# Patient Record
Sex: Female | Born: 1935
Health system: Southern US, Community
[De-identification: ages and names within clinical notes are randomized; demographics above are authoritative.]

## PROBLEM LIST (undated history)

## (undated) DIAGNOSIS — M353 Polymyalgia rheumatica: Secondary | ICD-10-CM

## (undated) DIAGNOSIS — E785 Hyperlipidemia, unspecified: Secondary | ICD-10-CM

## (undated) DIAGNOSIS — M1990 Unspecified osteoarthritis, unspecified site: Secondary | ICD-10-CM

## (undated) DIAGNOSIS — E119 Type 2 diabetes mellitus without complications: Secondary | ICD-10-CM

## (undated) DIAGNOSIS — R3129 Other microscopic hematuria: Secondary | ICD-10-CM

## (undated) DIAGNOSIS — I10 Essential (primary) hypertension: Secondary | ICD-10-CM

## (undated) HISTORY — DX: Hyperlipidemia, unspecified: E78.5

## (undated) HISTORY — PX: LAPAROSCOPIC HYSTERECTOMY: SHX1926

## (undated) HISTORY — DX: Essential (primary) hypertension: I10

## (undated) HISTORY — DX: Polymyalgia rheumatica: M35.3

## (undated) HISTORY — DX: Other microscopic hematuria: R31.29

## (undated) HISTORY — DX: Unspecified osteoarthritis, unspecified site: M19.90

## (undated) HISTORY — PX: CATARACT EXTRACTION: SUR2

---

## 2000-12-20 ENCOUNTER — Encounter: Payer: Self-pay | Admitting: Family Medicine

## 2000-12-20 ENCOUNTER — Encounter: Admission: RE | Admit: 2000-12-20 | Discharge: 2000-12-20 | Payer: Self-pay | Admitting: Family Medicine

## 2001-02-14 ENCOUNTER — Other Ambulatory Visit: Admission: RE | Admit: 2001-02-14 | Discharge: 2001-02-14 | Payer: Self-pay | Admitting: *Deleted

## 2001-03-15 ENCOUNTER — Encounter: Admission: RE | Admit: 2001-03-15 | Discharge: 2001-03-15 | Payer: Self-pay | Admitting: Family Medicine

## 2001-03-15 ENCOUNTER — Encounter: Payer: Self-pay | Admitting: Family Medicine

## 2001-07-06 ENCOUNTER — Encounter: Payer: Self-pay | Admitting: Family Medicine

## 2001-07-06 ENCOUNTER — Encounter: Admission: RE | Admit: 2001-07-06 | Discharge: 2001-07-06 | Payer: Self-pay | Admitting: Family Medicine

## 2001-07-08 ENCOUNTER — Encounter: Payer: Self-pay | Admitting: Gynecology

## 2001-07-09 ENCOUNTER — Inpatient Hospital Stay (HOSPITAL_COMMUNITY): Admission: RE | Admit: 2001-07-09 | Discharge: 2001-07-11 | Payer: Self-pay | Admitting: Gynecology

## 2001-12-18 ENCOUNTER — Encounter: Admission: RE | Admit: 2001-12-18 | Discharge: 2001-12-18 | Payer: Self-pay | Admitting: Family Medicine

## 2001-12-18 ENCOUNTER — Encounter: Payer: Self-pay | Admitting: Family Medicine

## 2001-12-25 ENCOUNTER — Encounter: Payer: Self-pay | Admitting: Family Medicine

## 2001-12-25 ENCOUNTER — Encounter: Admission: RE | Admit: 2001-12-25 | Discharge: 2001-12-25 | Payer: Self-pay | Admitting: Family Medicine

## 2002-11-14 ENCOUNTER — Other Ambulatory Visit: Admission: RE | Admit: 2002-11-14 | Discharge: 2002-11-14 | Payer: Self-pay | Admitting: Gynecology

## 2002-12-31 ENCOUNTER — Encounter: Payer: Self-pay | Admitting: Family Medicine

## 2002-12-31 ENCOUNTER — Ambulatory Visit (HOSPITAL_COMMUNITY): Admission: RE | Admit: 2002-12-31 | Discharge: 2002-12-31 | Payer: Self-pay | Admitting: Family Medicine

## 2003-07-04 HISTORY — PX: BREAST BIOPSY: SHX20

## 2003-12-16 ENCOUNTER — Other Ambulatory Visit: Admission: RE | Admit: 2003-12-16 | Discharge: 2003-12-16 | Payer: Self-pay | Admitting: Gynecology

## 2004-01-05 ENCOUNTER — Ambulatory Visit (HOSPITAL_COMMUNITY): Admission: RE | Admit: 2004-01-05 | Discharge: 2004-01-05 | Payer: Self-pay | Admitting: Gynecology

## 2004-08-11 ENCOUNTER — Emergency Department (HOSPITAL_COMMUNITY): Admission: EM | Admit: 2004-08-11 | Discharge: 2004-08-12 | Payer: Self-pay | Admitting: Emergency Medicine

## 2004-11-02 ENCOUNTER — Encounter: Payer: Self-pay | Admitting: Internal Medicine

## 2004-11-23 ENCOUNTER — Encounter: Payer: Self-pay | Admitting: Internal Medicine

## 2005-01-10 ENCOUNTER — Ambulatory Visit (HOSPITAL_COMMUNITY): Admission: RE | Admit: 2005-01-10 | Discharge: 2005-01-10 | Payer: Self-pay | Admitting: Gynecology

## 2005-02-10 ENCOUNTER — Encounter: Payer: Self-pay | Admitting: Internal Medicine

## 2005-09-04 ENCOUNTER — Encounter: Admission: RE | Admit: 2005-09-04 | Discharge: 2005-09-04 | Payer: Self-pay | Admitting: Family Medicine

## 2006-01-15 ENCOUNTER — Ambulatory Visit (HOSPITAL_COMMUNITY): Admission: RE | Admit: 2006-01-15 | Discharge: 2006-01-15 | Payer: Self-pay | Admitting: Family Medicine

## 2006-01-16 ENCOUNTER — Ambulatory Visit: Payer: Self-pay | Admitting: Family Medicine

## 2006-01-19 ENCOUNTER — Other Ambulatory Visit: Admission: RE | Admit: 2006-01-19 | Discharge: 2006-01-19 | Payer: Self-pay | Admitting: Gynecology

## 2006-02-23 ENCOUNTER — Encounter: Payer: Self-pay | Admitting: Internal Medicine

## 2006-02-23 ENCOUNTER — Ambulatory Visit (HOSPITAL_COMMUNITY): Admission: RE | Admit: 2006-02-23 | Discharge: 2006-02-23 | Payer: Self-pay | Admitting: Gastroenterology

## 2006-02-23 ENCOUNTER — Encounter (INDEPENDENT_AMBULATORY_CARE_PROVIDER_SITE_OTHER): Payer: Self-pay | Admitting: *Deleted

## 2006-02-23 LAB — HM COLONOSCOPY

## 2006-03-07 ENCOUNTER — Ambulatory Visit: Payer: Self-pay | Admitting: Family Medicine

## 2006-03-21 ENCOUNTER — Ambulatory Visit: Payer: Self-pay | Admitting: Family Medicine

## 2006-05-01 ENCOUNTER — Other Ambulatory Visit: Admission: RE | Admit: 2006-05-01 | Discharge: 2006-05-01 | Payer: Self-pay | Admitting: Gynecology

## 2006-05-11 ENCOUNTER — Ambulatory Visit: Payer: Self-pay | Admitting: Family Medicine

## 2006-05-11 LAB — CONVERTED CEMR LAB
Calcium: 9.1 mg/dL (ref 8.4–10.5)
Chloride: 102 meq/L (ref 96–112)
Chol/HDL Ratio, serum: 3.1
Creatinine, Ser: 0.7 mg/dL (ref 0.4–1.2)
HDL: 65.4 mg/dL (ref >39.0)
LDL Cholesterol: 101 mg/dL — ABNORMAL HIGH (ref 0–99)
Sodium: 140 meq/L (ref 135–145)
Triglyceride fasting, serum: 166 mg/dL — ABNORMAL HIGH (ref 0–149)

## 2006-05-14 ENCOUNTER — Encounter: Admission: RE | Admit: 2006-05-14 | Discharge: 2006-05-14 | Payer: Self-pay | Admitting: Obstetrics and Gynecology

## 2006-06-01 ENCOUNTER — Encounter: Admission: RE | Admit: 2006-06-01 | Discharge: 2006-06-01 | Payer: Self-pay | Admitting: General Surgery

## 2006-06-06 ENCOUNTER — Encounter (INDEPENDENT_AMBULATORY_CARE_PROVIDER_SITE_OTHER): Payer: Self-pay | Admitting: Specialist

## 2006-06-06 ENCOUNTER — Ambulatory Visit (HOSPITAL_BASED_OUTPATIENT_CLINIC_OR_DEPARTMENT_OTHER): Admission: RE | Admit: 2006-06-06 | Discharge: 2006-06-06 | Payer: Self-pay | Admitting: General Surgery

## 2006-07-25 DIAGNOSIS — K649 Unspecified hemorrhoids: Secondary | ICD-10-CM | POA: Insufficient documentation

## 2006-08-17 ENCOUNTER — Ambulatory Visit: Payer: Self-pay | Admitting: Family Medicine

## 2006-08-17 LAB — CONVERTED CEMR LAB
AST: 26 units/L (ref 0–37)
Calcium: 9 mg/dL (ref 8.4–10.5)
Chloride: 101 meq/L (ref 96–112)
Direct LDL: 115.9 mg/dL
GFR calc Af Amer: 126 mL/min
Glucose, Bld: 96 mg/dL (ref 70–99)
Total CHOL/HDL Ratio: 2.8
Triglycerides: 137 mg/dL (ref 0–149)
VLDL: 27 mg/dL (ref 0–40)

## 2006-11-20 DIAGNOSIS — I10 Essential (primary) hypertension: Secondary | ICD-10-CM | POA: Insufficient documentation

## 2006-11-29 ENCOUNTER — Encounter: Payer: Self-pay | Admitting: Family Medicine

## 2006-11-29 ENCOUNTER — Ambulatory Visit: Payer: Self-pay | Admitting: Family Medicine

## 2006-11-29 ENCOUNTER — Encounter (INDEPENDENT_AMBULATORY_CARE_PROVIDER_SITE_OTHER): Payer: Self-pay | Admitting: Family Medicine

## 2006-11-29 DIAGNOSIS — E785 Hyperlipidemia, unspecified: Secondary | ICD-10-CM

## 2007-03-01 ENCOUNTER — Ambulatory Visit: Payer: Self-pay | Admitting: Family Medicine

## 2007-03-05 ENCOUNTER — Telehealth (INDEPENDENT_AMBULATORY_CARE_PROVIDER_SITE_OTHER): Payer: Self-pay | Admitting: *Deleted

## 2007-03-05 LAB — CONVERTED CEMR LAB
BUN: 14 mg/dL (ref 6–23)
Calcium: 9.2 mg/dL (ref 8.4–10.5)
Cholesterol: 197 mg/dL (ref 0–200)
GFR calc Af Amer: 126 mL/min
GFR calc non Af Amer: 104 mL/min
Triglycerides: 148 mg/dL (ref 0–149)
VLDL: 30 mg/dL (ref 0–40)

## 2007-06-13 ENCOUNTER — Ambulatory Visit: Payer: Self-pay | Admitting: Family Medicine

## 2007-06-13 LAB — CONVERTED CEMR LAB
BUN: 15 mg/dL (ref 6–23)
CO2: 29 meq/L (ref 19–32)
Calcium: 9.1 mg/dL (ref 8.4–10.5)
Chloride: 99 meq/L (ref 96–112)
Creatinine, Ser: 0.7 mg/dL (ref 0.4–1.2)
GFR calc Af Amer: 105 mL/min
Glucose, Bld: 93 mg/dL (ref 70–99)

## 2007-06-14 ENCOUNTER — Telehealth (INDEPENDENT_AMBULATORY_CARE_PROVIDER_SITE_OTHER): Payer: Self-pay | Admitting: *Deleted

## 2007-06-14 ENCOUNTER — Encounter (INDEPENDENT_AMBULATORY_CARE_PROVIDER_SITE_OTHER): Payer: Self-pay | Admitting: *Deleted

## 2007-09-11 ENCOUNTER — Ambulatory Visit: Payer: Self-pay | Admitting: Family Medicine

## 2007-09-12 ENCOUNTER — Encounter: Admission: RE | Admit: 2007-09-12 | Discharge: 2007-09-12 | Payer: Self-pay | Admitting: Family Medicine

## 2007-09-13 ENCOUNTER — Encounter (INDEPENDENT_AMBULATORY_CARE_PROVIDER_SITE_OTHER): Payer: Self-pay | Admitting: *Deleted

## 2007-09-13 LAB — CONVERTED CEMR LAB
ALT: 19 units/L (ref 0–35)
CO2: 30 meq/L (ref 19–32)
Calcium: 9.2 mg/dL (ref 8.4–10.5)
Chloride: 105 meq/L (ref 96–112)
Direct LDL: 111.2 mg/dL
GFR calc Af Amer: 105 mL/min
GFR calc non Af Amer: 87 mL/min
Potassium: 3.7 meq/L (ref 3.5–5.1)
Sodium: 140 meq/L (ref 135–145)
Triglycerides: 187 mg/dL — ABNORMAL HIGH (ref 0–149)

## 2007-10-03 ENCOUNTER — Telehealth (INDEPENDENT_AMBULATORY_CARE_PROVIDER_SITE_OTHER): Payer: Self-pay | Admitting: *Deleted

## 2008-01-10 ENCOUNTER — Ambulatory Visit: Payer: Self-pay | Admitting: Internal Medicine

## 2008-01-10 DIAGNOSIS — M353 Polymyalgia rheumatica: Secondary | ICD-10-CM

## 2008-01-14 LAB — CONVERTED CEMR LAB
Basophils Absolute: 0.1 10*3/uL (ref 0.0–0.1)
Eosinophils Absolute: 0.1 10*3/uL (ref 0.0–0.7)
Eosinophils Relative: 2 % (ref 0.0–5.0)
Hemoglobin: 13.5 g/dL (ref 12.0–15.0)
Monocytes Absolute: 0.4 10*3/uL (ref 0.1–1.0)
Monocytes Relative: 6 % (ref 3.0–12.0)
Neutro Abs: 3.2 10*3/uL (ref 1.4–7.7)
RBC: 4.14 M/uL (ref 3.87–5.11)
TSH: 1.99 microintl units/mL (ref 0.35–5.50)
Total CK: 192 units/L — ABNORMAL HIGH (ref 7–177)

## 2008-01-29 ENCOUNTER — Telehealth (INDEPENDENT_AMBULATORY_CARE_PROVIDER_SITE_OTHER): Payer: Self-pay | Admitting: *Deleted

## 2008-02-06 ENCOUNTER — Ambulatory Visit: Payer: Self-pay | Admitting: Internal Medicine

## 2008-04-24 ENCOUNTER — Ambulatory Visit: Payer: Self-pay | Admitting: Internal Medicine

## 2008-07-29 ENCOUNTER — Ambulatory Visit: Payer: Self-pay | Admitting: Internal Medicine

## 2008-07-31 ENCOUNTER — Encounter (INDEPENDENT_AMBULATORY_CARE_PROVIDER_SITE_OTHER): Payer: Self-pay | Admitting: *Deleted

## 2008-07-31 ENCOUNTER — Telehealth (INDEPENDENT_AMBULATORY_CARE_PROVIDER_SITE_OTHER): Payer: Self-pay | Admitting: *Deleted

## 2008-08-03 ENCOUNTER — Encounter (INDEPENDENT_AMBULATORY_CARE_PROVIDER_SITE_OTHER): Payer: Self-pay | Admitting: *Deleted

## 2008-08-04 ENCOUNTER — Other Ambulatory Visit: Admission: RE | Admit: 2008-08-04 | Discharge: 2008-08-04 | Payer: Self-pay | Admitting: Gynecology

## 2008-08-04 ENCOUNTER — Encounter: Payer: Self-pay | Admitting: Gynecology

## 2008-08-04 ENCOUNTER — Ambulatory Visit: Payer: Self-pay | Admitting: Gynecology

## 2008-09-10 ENCOUNTER — Telehealth (INDEPENDENT_AMBULATORY_CARE_PROVIDER_SITE_OTHER): Payer: Self-pay | Admitting: *Deleted

## 2008-09-25 ENCOUNTER — Ambulatory Visit: Payer: Self-pay | Admitting: Internal Medicine

## 2008-11-26 ENCOUNTER — Ambulatory Visit: Payer: Self-pay | Admitting: Internal Medicine

## 2008-12-16 ENCOUNTER — Ambulatory Visit: Payer: Self-pay | Admitting: Internal Medicine

## 2008-12-16 DIAGNOSIS — M170 Bilateral primary osteoarthritis of knee: Secondary | ICD-10-CM

## 2008-12-22 ENCOUNTER — Telehealth: Payer: Self-pay | Admitting: Internal Medicine

## 2009-03-09 ENCOUNTER — Telehealth (INDEPENDENT_AMBULATORY_CARE_PROVIDER_SITE_OTHER): Payer: Self-pay | Admitting: *Deleted

## 2009-04-01 ENCOUNTER — Encounter: Admission: RE | Admit: 2009-04-01 | Discharge: 2009-04-01 | Payer: Self-pay | Admitting: Internal Medicine

## 2009-04-02 ENCOUNTER — Telehealth (INDEPENDENT_AMBULATORY_CARE_PROVIDER_SITE_OTHER): Payer: Self-pay | Admitting: *Deleted

## 2009-04-19 ENCOUNTER — Ambulatory Visit: Payer: Self-pay | Admitting: Internal Medicine

## 2009-04-19 DIAGNOSIS — N951 Menopausal and female climacteric states: Secondary | ICD-10-CM | POA: Insufficient documentation

## 2009-08-17 ENCOUNTER — Ambulatory Visit: Payer: Self-pay | Admitting: Internal Medicine

## 2009-08-24 ENCOUNTER — Ambulatory Visit: Payer: Self-pay | Admitting: Internal Medicine

## 2009-08-30 LAB — CONVERTED CEMR LAB
ALT: 21 units/L (ref 0–35)
BUN: 22 mg/dL (ref 6–23)
CO2: 30 meq/L (ref 19–32)
Calcium: 9.5 mg/dL (ref 8.4–10.5)
Creatinine, Ser: 0.8 mg/dL (ref 0.4–1.2)
Direct LDL: 164.8 mg/dL
Eosinophils Absolute: 0.2 10*3/uL (ref 0.0–0.7)
GFR calc non Af Amer: 74.16 mL/min (ref >60)
Hemoglobin: 13.1 g/dL (ref 12.0–15.0)
Lymphs Abs: 2.3 10*3/uL (ref 0.7–4.0)
MCHC: 33.1 g/dL (ref 30.0–36.0)
MCV: 95.3 fL (ref 78.0–100.0)
Platelets: 263 10*3/uL (ref 150.0–400.0)
TSH: 2.55 microintl units/mL (ref 0.35–5.50)
Total CHOL/HDL Ratio: 4
Triglycerides: 134 mg/dL (ref 0.0–149.0)
VLDL: 26.8 mg/dL (ref 0.0–40.0)
WBC: 5.5 10*3/uL (ref 4.5–10.5)

## 2010-03-03 ENCOUNTER — Telehealth (INDEPENDENT_AMBULATORY_CARE_PROVIDER_SITE_OTHER): Payer: Self-pay | Admitting: *Deleted

## 2010-04-06 ENCOUNTER — Ambulatory Visit: Payer: Self-pay | Admitting: Internal Medicine

## 2010-04-07 ENCOUNTER — Encounter (INDEPENDENT_AMBULATORY_CARE_PROVIDER_SITE_OTHER): Payer: Self-pay | Admitting: *Deleted

## 2010-04-21 ENCOUNTER — Ambulatory Visit: Payer: Self-pay | Admitting: Internal Medicine

## 2010-04-21 DIAGNOSIS — S61409A Unspecified open wound of unspecified hand, initial encounter: Secondary | ICD-10-CM | POA: Insufficient documentation

## 2010-04-22 ENCOUNTER — Encounter: Admission: RE | Admit: 2010-04-22 | Discharge: 2010-04-22 | Payer: Self-pay | Admitting: Internal Medicine

## 2010-04-22 ENCOUNTER — Encounter: Payer: Self-pay | Admitting: Internal Medicine

## 2010-04-29 ENCOUNTER — Telehealth: Payer: Self-pay | Admitting: Internal Medicine

## 2010-05-13 ENCOUNTER — Telehealth: Payer: Self-pay | Admitting: Internal Medicine

## 2010-05-13 ENCOUNTER — Encounter (INDEPENDENT_AMBULATORY_CARE_PROVIDER_SITE_OTHER): Payer: Self-pay | Admitting: *Deleted

## 2010-05-17 ENCOUNTER — Encounter: Payer: Self-pay | Admitting: Internal Medicine

## 2010-05-17 ENCOUNTER — Telehealth (INDEPENDENT_AMBULATORY_CARE_PROVIDER_SITE_OTHER): Payer: Self-pay | Admitting: *Deleted

## 2010-06-09 ENCOUNTER — Telehealth: Payer: Self-pay | Admitting: Internal Medicine

## 2010-07-07 ENCOUNTER — Telehealth: Payer: Self-pay | Admitting: Internal Medicine

## 2010-07-31 LAB — CONVERTED CEMR LAB
ALT: 17 U/L
AST: 22 U/L
BUN: 15 mg/dL
CO2: 31 meq/L
Calcium: 9.3 mg/dL
Chloride: 102 meq/L
Cholesterol: 211 mg/dL
Creatinine, Ser: 0.7 mg/dL
Direct LDL: 109.2 mg/dL
GFR calc Af Amer: 105 mL/min
GFR calc non Af Amer: 87 mL/min
Glucose, Bld: 87 mg/dL
HDL: 79.2 mg/dL
Potassium: 3.7 meq/L
Sodium: 138 meq/L
Total CHOL/HDL Ratio: 2.7
Triglycerides: 148 mg/dL
VLDL: 30 mg/dL
Vit D, 1,25-Dihydroxy: 41

## 2010-08-02 NOTE — Assessment & Plan Note (Signed)
Summary: WALK IN/CUT HAND/KN   Vital Signs:  Patient profile:   77 year old female Weight:      140.38 pounds Pulse rate:   90 / minute Pulse rhythm:   regular BP sitting:   126 / 84  (left arm) Cuff size:   regular  Vitals Entered By: Army Fossa CMA (April 21, 2010 8:24 AM) CC: Cut hand with scissors yesterday Comments (L) hand very bruised today.  [facility] [address]   History of Present Illness: cut her hand w/ scissor  yesterday, immediately after she developed a hematomain the area. She has some external bleeding but not much. She already cleaned the area very well. At this point pain is mild  Allergies: No Known Drug Allergies  Past History:  Past Medical History: Reviewed history from 08/17/2009 and no changes required. HYPERLIPIDEMIA  HYPERTENSION  HEMORRHOIDS   ?PMR Osteoarthritis  Past Surgical History: Reviewed history from 07/29/2008 and no changes required. hysterectomy Dr Lily Peer ?oophorectomy stress incontinence eval  and microhematuria  eval  Dr Patsi Sears 2004 Cataract extraction breast Bx (L) ---> benign , aprox 2005  Review of Systems       no discharge from the area No fever  Physical Exam  General:  alert and well-developed.   Msk:  left wrist normal Left fingers are all normal, good capillary refill, good range of motion. No motor deficits noted Extremities:  left hand has 2 superficial excoriations in the dorsum, no active bleeding  has a 4x2 cm superficial hematoma around the excoriations. no warmth redness or redness    Impression & Recommendations:  Problem # 1:  WOUND, HAND (ICD-882.0) patient develop a hematoma following a cut  with the tip of the scissors she is updated her tetanus recommend to keep the hand elevated recommend to keep the wounds clean and dry and put antibiotic ointment knows to call if she develops redness or warmness  Complete Medication List: 1)  Carvedilol 6.25 Mg Tabs (Carvedilol) .Marland Kitchen.. 1 by  mouth two times a day 2)  Fosinopril Sodium 40 Mg Tabs (Fosinopril sodium) .... Take 2  tablet daily 3)  Hydrochlorothiazide 25 Mg Tabs (Hydrochlorothiazide) .Marland Kitchen.. 1 by mouth qd 4)  Bayer Low Strength 81 Mg Tbec (Aspirin) 5)  Oscal 500/200 D-3 500-200 Mg-unit Tabs (Calcium-vitamin d) 6)  Centrum Silver Tabs (Multiple vitamins-minerals) 7)  Arthritis Pain Reliever 650 Mg Tbcr (Acetaminophen) 8)  Omega 3 Fish Oil    Orders Added: 1)  Est. Patient Level III [36644]

## 2010-08-02 NOTE — Progress Notes (Signed)
Summary: referral  Phone Note Call from Patient Call back at Home Phone [phone]   Caller: Daughter Summary of Call: Pts daughter called and stated that becuase of pts arthritis, they would like a referral to a specialist. She is in a lot of pain the daughter states. Please advise. Initial call taken by: Army Fossa CMA,  April 29, 2010 2:26 PM  Follow-up for Phone Call        refer to orthopedic surgery Follow-up by: [facility] E. Paz MD,  May 02, 2010 12:56 PM

## 2010-08-02 NOTE — Letter (Signed)
Summary: Unable To Reach-Consult Scheduled  [facility] [facility] [place]  [address] [place] [address], [place] [address]   Phone: [phone]  Fax: [phone]    05/13/2010 MRN: [id number]    Dear Ms. Locken,   We have been unable to reach you by phone.  Please contact our office with an updated phone number.     Thank you,  [place] [facility]

## 2010-08-02 NOTE — Progress Notes (Signed)
Summary: appt  Phone Note Outgoing Call   Summary of Call: Pt needs a 6 month f/u appt with dr Drue Novel. Army Fossa CMA  March 03, 2010 7:47 AM   Follow-up for Phone Call        [email or web address] Negrete  March 03, 2010 12:12 PM  PATIENT DAUGHTER CALLED BACK - PATIENT DOESNT UNDERSTAND ENGLISH - APPT SCHEDULED [phone] .Marland KitchenOkey Regal [place]  March 03, 2010 4:10 PM

## 2010-08-02 NOTE — Assessment & Plan Note (Signed)
Summary: yearly checkup/NS/KDC   Vital Signs:  Patient profile:   77 year old female Height:      56 inches Weight:      139.4 pounds BMI:     31.37 Pulse rate:   78 / minute BP sitting:   150 / 80 CC: cpx Comments not fasting   History of Present Illness: HYPERLIPIDEMIA -- on fish oil; her diet was not good  in the last few months, not active   HYPERTENSION -good medication compliance , no ambulatory BPs    Osteoarthritis-- still has pain, ankle , knees, hands . Hydrocodone helps   yearly, chart reviewed  Allergies: No Known Drug Allergies  Past History:  Past Medical History: HYPERLIPIDEMIA  HYPERTENSION  HEMORRHOIDS   ?PMR Osteoarthritis  Past Surgical History: Reviewed history from 07/29/2008 and no changes required. hysterectomy Dr Lily Peer ?oophorectomy stress incontinence eval  and microhematuria  eval  Dr Patsi Sears 2004 Cataract extraction breast Bx (L) ---> benign , aprox 2005  Social History: Reviewed history from 04/19/2009 and no changes required. Widow  original from [place] lives w/ her  3 daughters Tobacco--no ETOH -- no  Review of Systems       denies chest pain, shortness of breath some mild bilateral ankle edema no nausea vomiting or diarrhea.  No blood in the stools mild anxiety  and depression sometimes mostly related to family issues no dysuria or hematuria  Physical Exam  General:  alert, well-developed, and well-nourished.   Neck:  no masses and no thyromegaly.   Lungs:  normal respiratory effort, no intercostal retractions, no accessory muscle use, and normal breath sounds.   Heart:  normal rate, regular rhythm, and no murmur.   Abdomen:  soft, non-tender, no distention, no masses, no guarding, and no rigidity.   Extremities:  trace ankle edema bilateral  hands with OA type of changes, mostly at the DIPs, no warmness or puffiness wrists  also without warmness or puffiness Psych:  Oriented X3, memory intact for recent  and remote, normally interactive, good eye contact, not anxious appearing, and not depressed appearing.     Impression & Recommendations:  Problem # 1:  OSTEOARTHRITIS (ICD-715.90) she took hydrocodone before without side effects Will try to use Tylenol first and then hydrocodone if the pain is severe, see instructions Her updated medication list for this problem includes:    Hydrocodone-acetaminophen 5-500 Mg Tabs (Hydrocodone-acetaminophen) ..... Half or one tablet every 6 hours as needed for pain    Bayer Low Strength 81 Mg Tbec (Aspirin)    Arthritis Pain Reliever 650 Mg Tbcr (Acetaminophen)  Problem # 2:  HEALTH SCREENING (ICD-V70.0) CHART REVIEWED  diet- exercise encouraged  Td-- today pneumonia shot 2010 shingles shot 3-10 had a flu shot   MMG  9-10, neg  last OV w/ Gyn 2010, need to get records DEXA ---> per Dr Lily Peer? get records   had a Cscope Dr Loreta Ave aprox 4 years ago per pt  , get records      Problem # 3:  HYPERLIPIDEMIA (ICD-272.4) due for labs, on diet only  Labs Reviewed: SGOT: 22 (07/29/2008)   SGPT: 17 (07/29/2008)   HDL:79.2 (07/29/2008), 77.0 (09/11/2007)  LDL:DEL (07/29/2008), DEL (09/11/2007)  Chol:211 (07/29/2008), 216 (09/11/2007)  Trig:148 (07/29/2008), 187 (09/11/2007)  Problem # 4:  HYPERTENSION (ICD-401.9) bp slightly  elevated today recheck on RTC labs  Her updated medication list for this problem includes:    Carvedilol 6.25 Mg Tabs (Carvedilol) .Marland Kitchen... 1 by mouth two times a  day    Fosinopril Sodium 40 Mg Tabs (Fosinopril sodium) .Marland Kitchen... Take 2  tablet daily    Hydrochlorothiazide 25 Mg Tabs (Hydrochlorothiazide) .Marland Kitchen... 1 by mouth qd  BP today: 150/80 Prior BP: 130/84 (04/19/2009)  Labs Reviewed: K+: 3.7 (07/29/2008) Creat: : 0.7 (07/29/2008)   Chol: 211 (07/29/2008)   HDL: 79.2 (07/29/2008)   LDL: DEL (07/29/2008)   TG: 148 (07/29/2008)  Her updated medication list for this problem includes:    Carvedilol 6.25 Mg Tabs (Carvedilol)  .Marland Kitchen... 1 by mouth two times a day    Fosinopril Sodium 40 Mg Tabs (Fosinopril sodium) .Marland Kitchen... Take 2  tablet daily    Hydrochlorothiazide 25 Mg Tabs (Hydrochlorothiazide) .Marland Kitchen... 1 by mouth qd  Complete Medication List: 1)  Carvedilol 6.25 Mg Tabs (Carvedilol) .Marland Kitchen.. 1 by mouth two times a day 2)  Fosinopril Sodium 40 Mg Tabs (Fosinopril sodium) .... Take 2  tablet daily 3)  Hydrochlorothiazide 25 Mg Tabs (Hydrochlorothiazide) .Marland Kitchen.. 1 by mouth qd 4)  Hydrocodone-acetaminophen 5-500 Mg Tabs (Hydrocodone-acetaminophen) .... Half or one tablet every 6 hours as needed for pain 5)  Bayer Low Strength 81 Mg Tbec (Aspirin) 6)  Oscal 500/200 D-3 500-200 Mg-unit Tabs (Calcium-vitamin d) 7)  Centrum Silver Tabs (Multiple vitamins-minerals) 8)  Arthritis Pain Reliever 650 Mg Tbcr (Acetaminophen) 9)  Omega 3 Fish Oil   Other Orders: TD Toxoids IM 7 YR + ([address]) Admin 1st Vaccine ([address]) Admin 1st Vaccine [facility]) [phone]  Patient Instructions: 1)  come back fasting: 2)  BMP CBC TSH--- Dx  hypertension 3)  FLP  AST ALT---- dx  high cholesterol 4)  for pain, take 1000  mg of tylenol every 6  hours as needed .if the pain is severe, takes hydrocodone instead.  Hydrocodone also has  tyelnol on it 5)  Avoid taking more than 4000 mg of tylenol in a 24 hour period( can cause liver damage in higher doses).  6)  Please schedule a follow-up appointment in 4 to 6 months .  Prescriptions: HYDROCODONE-ACETAMINOPHEN 5-500 MG TABS (HYDROCODONE-ACETAMINOPHEN) half or one tablet every 6 hours as needed for pain  #60 x 0   Entered and Authorized by:   Nolon Rod. Alexia Dinger MD   Signed by:   Nolon Rod. Mylo Choi MD on 08/17/2009   Method used:   Print then Give to Patient   RxID:   [id number]    Tetanus/Td Vaccine    Vaccine Type: Td    Site: left deltoid    Dose: 0.5 ml    Route: IM    Given by: rachel peeler    Exp. Date: 09/15/2009    Lot #: [id number]  Appended Document: yearly checkup/NS/KDC COLONOSCOPY: Results: Polyp:   polypod colorectal mucosa.  No adenomatous change or malignancy identified------Jyothi Loreta Ave, MD (02/23/2006 10:53) Repeat colonoscopy in 10 years.

## 2010-08-02 NOTE — Letter (Signed)
Summary: Primary Care Consult Scheduled Letter  [facility] [place]  [address] [place], [place] [address]   Phone: [phone]  Fax: [phone]      04/07/2010 MRN: [id number]  Alison Graves [address] [place], [place]  [address]    Dear Ms. Durkee,    We have scheduled an appointment for you.  At the recommendation of Dr. Willow Ora, we have scheduled you for a Bone Density Scan with [facility] on 04-22-2010 at 1:30pm.  Their address is [address], [address], [place] [address]. The office phone number is [phone].  If this appointment day and time is not convenient for you, please feel free to call the office of the doctor you are being referred to at the number listed above and reschedule the appointment.    It is important for you to keep your scheduled appointments. We are here to make sure you are given good patient care.   Thank you,    Renee, Patient Care Coordinator  at [facility]

## 2010-08-02 NOTE — Letter (Signed)
Summary: Patient Not Returning Calls/[facility]  Patient Not Returning Calls/[facility]   Imported By: Lanelle Bal 06/07/2010 14:09:35  _____________________________________________________________________  External Attachment:    Type:   Image     Comment:   External Document

## 2010-08-02 NOTE — Progress Notes (Signed)
Summary: dental referral-lmom  Phone Note Call from Patient   Caller: Daughter-Isabel Summary of Call: patient left msg on voicemail to have referral for dentist.  Follow-up for Phone Call        left message on machine .......Marland KitchenDoristine Devoid CMA  May 17, 2010 5:03 PM

## 2010-08-02 NOTE — Progress Notes (Signed)
Summary: problem filling Fosinopril  Phone Note Call from Patient Call back at Home Phone [phone]   Caller: daughter [phone] Summary of Call: Alison Graves called to see why prescription [facility]---I explained that prescription for this medication plus refills was called in on Nov 2011 and that this existing prescription already has refills --[facility] [place] [facility]---[facility]---please call pharmacy to resolve problem, then call Alison Graves so she can pick up her mom's prescription Initial call taken by: Jerolyn Shin,  June 09, 2010 1:02 PM  Follow-up for Phone Call        [facility] and their [facility] are [facility] and they will have  the prescription ready for the patient tomorrow. They have sent requests and we can ignore those. Patient daughter notified. Alison Graves CMA  June 09, 2010 2:54 PM

## 2010-08-02 NOTE — Procedures (Signed)
Summary: Colonoscopy/ polyp x 1 , next 10 years   Colonoscopy/[facility]   Imported By: Lanelle Bal 09/09/2009 09:34:21  _____________________________________________________________________  External Attachment:    Type:   Image     Comment:   External Document

## 2010-08-02 NOTE — Miscellaneous (Signed)
Summary: colonoscopy report  Clinical Lists Changes  Observations: Added new observation of COLONRECACT: Repeat colonoscopy in 10 years.   (02/23/2006 10:53) Added new observation of COLONOSCOPY: Results: Polyp:  polypod colorectal mucosa.  No adenomatous change or malignancy identified Charna Elizabeth, MD (02/23/2006 10:53)      Colonoscopy  Procedure date:  02/23/2006  Findings:      Results: Polyp:  polypod colorectal mucosa.  No adenomatous change or malignancy identified Charna Elizabeth, MD  Comments:      Repeat colonoscopy in 10 years.     Colonoscopy  Procedure date:  02/23/2006  Findings:      Results: Polyp:  polypod colorectal mucosa.  No adenomatous change or malignancy identified Charna Elizabeth, MD  Comments:      Repeat colonoscopy in 10 years.

## 2010-08-02 NOTE — Assessment & Plan Note (Signed)
Summary: rto 6 months/cbs   Vital Signs:  Patient profile:   77 year old female Height:      56 inches (142.24 cm) Weight:      141.50 pounds (64.32 kg) BMI:     31.84 O2 Sat:      95 % on Room air Temp:     97.1 degrees F (36.17 degrees C) oral Pulse rate:   80 / minute BP sitting:   140 / 80  (left arm)  Vitals Entered By: Lucious Groves CMA (April 06, 2010 12:54 PM)  O2 Flow:  Room air CC: 6 mo f/u./kb Is Patient Diabetic? No Pain Assessment Patient in pain? no      Comments Patient denies taking Hydrocodone stating that she only takes Tylenol./kb   History of Present Illness: ROV still has pain of OA, taking tylenol only, did not like to take hydrocodone h/o celebrex use, tolerated well      Current Medications (verified): 1)  Carvedilol 6.25 Mg  Tabs (Carvedilol) .Marland Kitchen.. 1 By Mouth Two Times A Day 2)  Fosinopril Sodium 40 Mg Tabs (Fosinopril Sodium) .... Take 2  Tablet Daily 3)  Hydrochlorothiazide 25 Mg Tabs (Hydrochlorothiazide) .Marland Kitchen.. 1 By Mouth Qd 4)  Hydrocodone-Acetaminophen 5-500 Mg Tabs (Hydrocodone-Acetaminophen) .... Half or One Tablet Every 6 Hours As Needed For Pain 5)  Bayer Low Strength 81 Mg  Tbec (Aspirin) 6)  Oscal 500/200 D-3 500-200 Mg-Unit  Tabs (Calcium-Vitamin D) 7)  Centrum Silver   Tabs (Multiple Vitamins-Minerals) 8)  Arthritis Pain Reliever 650 Mg  Tbcr (Acetaminophen) 9)  Omega 3 Fish Oil  Allergies (verified): No Known Drug Allergies  Past History:  Past Medical History: Reviewed history from 08/17/2009 and no changes required. HYPERLIPIDEMIA  HYPERTENSION  HEMORRHOIDS   ?PMR Osteoarthritis  Past Surgical History: Reviewed history from 07/29/2008 and no changes required. hysterectomy Dr Lily Peer ?oophorectomy stress incontinence eval  and microhematuria  eval  Dr Patsi Sears 2004 Cataract extraction breast Bx (L) ---> benign , aprox 2005  Social History: Reviewed history from 04/19/2009 and no changes required. Widow    original from [place] lives w/ her  3 daughters Tobacco--no ETOH -- no  Review of Systems General:  diet-- has not changed  exercise -- no. CV:  Denies chest pain or discomfort and swelling of feet. Resp:  Denies shortness of breath; occasional cough, dry , mild, not frecuent .  Physical Exam  General:  alert and well-developed.   Lungs:  normal respiratory effort, no intercostal retractions, no accessory muscle use, and normal breath sounds.   Heart:  normal rate, regular rhythm, and no murmur.   Extremities:  no lower extremity edema   Impression & Recommendations:  Problem # 1:  OSTEOARTHRITIS (ICD-715.90)  continued with pain,most of the pain is in her knees, declined  to take hydrocodone We talked about her  options... Refer to orthopedic surgery, total knee replacement? Declined Other option would be to take Celebrex, she agreed to a trial  The following medications were removed from the medication list:    Hydrocodone-acetaminophen 5-500 Mg Tabs (Hydrocodone-acetaminophen) ..... Half or one tablet every 6 hours as needed for pain Her updated medication list for this problem includes:    Bayer Low Strength 81 Mg Tbec (Aspirin)    Arthritis Pain Reliever 650 Mg Tbcr (Acetaminophen)    Celebrex 100 Mg Caps (Celecoxib) ..... One by mouth twice a day as needed for pain  Orders: Gynecologic Referral (Gyn) Radiology Referral (Radiology)  Problem # 2:  HEALTH SCREENING (ICD-V70.0) her FLP was a slightly elevated the last time, diet  discussed. exercise limited due to  DJD Likes to see a new gynecologist, referral I have not been able to obtain her latest bone density test, patient thinks it was more than 4 years ago. Ordering a DEXA Flu shot provided  Problem # 3:  HYPERTENSION (ICD-401.9) well controlled Her updated medication list for this problem includes:    Carvedilol 6.25 Mg Tabs (Carvedilol) .Marland Kitchen... 1 by mouth two times a day    Fosinopril Sodium 40 Mg Tabs  (Fosinopril sodium) .Marland Kitchen... Take 2  tablet daily    Hydrochlorothiazide 25 Mg Tabs (Hydrochlorothiazide) .Marland Kitchen... 1 by mouth qd  BP today: 140/80 Prior BP: 150/80 (08/17/2009)  Labs Reviewed: K+: 3.9 (08/24/2009) Creat: : 0.8 (08/24/2009)   Chol: 249 (08/24/2009)   HDL: 68.40 (08/24/2009)   LDL: DEL (07/29/2008)   TG: 134.0 (08/24/2009)  Complete Medication List: 1)  Carvedilol 6.25 Mg Tabs (Carvedilol) .Marland Kitchen.. 1 by mouth two times a day 2)  Fosinopril Sodium 40 Mg Tabs (Fosinopril sodium) .... Take 2  tablet daily 3)  Hydrochlorothiazide 25 Mg Tabs (Hydrochlorothiazide) .Marland Kitchen.. 1 by mouth qd 4)  Bayer Low Strength 81 Mg Tbec (Aspirin) 5)  Oscal 500/200 D-3 500-200 Mg-unit Tabs (Calcium-vitamin d) 6)  Centrum Silver Tabs (Multiple vitamins-minerals) 7)  Arthritis Pain Reliever 650 Mg Tbcr (Acetaminophen) 8)  Omega 3 Fish Oil  9)  Celebrex 100 Mg Caps (Celecoxib) .... One by mouth twice a day as needed for pain  Other Orders: Flu Vaccine 33yrs + MEDICARE PATIENTS (M0102) Administration Flu vaccine - MCR (V2536)  Patient Instructions: 1)  take Celebrex twice a day with food as needed for pain, you can also use Tylenol in addition to Celebrex if the pain continued 2)  Please schedule a follow-up appointment in 6 months , fasting, physical exam Prescriptions: CELEBREX 100 MG CAPS (CELECOXIB) one by mouth twice a day as needed for pain  #60 x 1   Entered and Authorized by:   Elita Quick E. Paz MD   Signed by:   Nolon Rod. Paz MD on 04/06/2010   Method used:   Print then Give to Patient   RxID:   [id number]  Flu Vaccine Consent Questions     Do you have a history of severe allergic reactions to this vaccine? no    Any prior history of allergic reactions to egg and/or gelatin? no    Do you have a sensitivity to the preservative Thimersol? no    Do you have a past history of Guillan-Barre Syndrome? no    Do you currently have an acute febrile illness? no    Have you ever had a severe reaction to  latex? no    Vaccine information given and explained to patient? yes    Are you currently pregnant? no    Lot Number:[id number]   Exp Date:12/31/2010   Site Given  Right Deltoid IM:   Print then Give to Patient   RxID:   [id number]     [email or web address]

## 2010-08-02 NOTE — Progress Notes (Signed)
Summary: Referrals  Phone Note Call from Patient Call back at Home Phone [phone]   Summary of Call: Patient daughter called stating that the numbers above are correct for the patient but she does not answer the phone due to not speaking english very well. The office will have to speak with Paula Compton or Windell Moulding in regards to the patient.   Paula Compton was advised of patient pending referrals and will r/s patient OB appt and call [facility] to confirm if the patient was given an appt. or not. Initial call taken by: Lucious Groves CMA,  May 13, 2010 4:04 PM

## 2010-08-04 NOTE — Progress Notes (Signed)
Summary: Med refill  Phone Note Call from Patient   Caller: Daughter--[phone] Summary of Call: Patient daughter called noting the patient uses [facility] on [address]. Initial call taken by: Lucious Groves CMA,  July 07, 2010 9:46 AM    Prescriptions: HYDROCHLOROTHIAZIDE 25 MG TABS (HYDROCHLOROTHIAZIDE) 1 by mouth qd  #30 Tablet x 2   Entered by:   Lucious Groves CMA   Authorized by:   Nolon Rod. Kanton Kamel MD   Signed by:   Lucious Groves CMA on 07/07/2010   Method used:   Electronically to        [facility] [address] (retail)       [phone]       [place], [place]  [address]       Ph: [phone]       Fax: [phone]   RxID:   [id number] CARVEDILOL 6.25 MG  TABS (CARVEDILOL) 1 by mouth two times a day  #60 Tablet x 2   Entered by:   Lucious Groves CMA   Authorized by:   Nolon Rod. Taziah Difatta MD   Signed by:   Lucious Groves CMA on 07/07/2010   Method used:   Electronically to        [facility] [address] (retail)       [phone]       [place], [place]  [address]       Ph: [phone]       Fax: [phone]   RxID:   [id number]

## 2010-08-18 ENCOUNTER — Telehealth: Payer: Self-pay | Admitting: Internal Medicine

## 2010-08-24 ENCOUNTER — Encounter: Payer: Self-pay | Admitting: Internal Medicine

## 2010-08-24 NOTE — Progress Notes (Signed)
Summary: Fosinopril issue  Phone Note Call from Patient   Caller: Daughter--Ruth  [phone] Summary of Call: Patient daughter left message on triage that the patient could not get her Fosinopril and the pharmacy made them aware that our office needs to take care of the insurance. She states that the insurance only wants to provide #30 for the patient. I left message on voicemail at pharmacy requesting prior authorization/rejected claim to fix this issue for the patient. Lucious Groves CMA  August 18, 2010 9:35 AM   Follow-up for Phone Call        I have not received a fax from [facility]. Called again, and she was given #60 yesterday. I made them aware that I will call to discuss with patient daughter.   Patient daughter is aware of the above, and states that it was misguided info provided by the pharmacy. Patient did get her meds. No further action required. Lucious Groves CMA  August 19, 2010 11:35 AM

## 2010-08-26 ENCOUNTER — Emergency Department (HOSPITAL_COMMUNITY): Payer: Medicare Other

## 2010-08-26 ENCOUNTER — Emergency Department (HOSPITAL_COMMUNITY)
Admission: EM | Admit: 2010-08-26 | Discharge: 2010-08-26 | Disposition: A | Discharge status: Home / Self Care | Payer: Medicare Other | Attending: Emergency Medicine | Admitting: Emergency Medicine

## 2010-08-26 ENCOUNTER — Ambulatory Visit (INDEPENDENT_AMBULATORY_CARE_PROVIDER_SITE_OTHER): Payer: Medicare Other | Admitting: Internal Medicine

## 2010-08-26 ENCOUNTER — Encounter: Payer: Self-pay | Admitting: Internal Medicine

## 2010-08-26 DIAGNOSIS — R0789 Other chest pain: Secondary | ICD-10-CM | POA: Insufficient documentation

## 2010-08-26 DIAGNOSIS — Z79899 Other long term (current) drug therapy: Secondary | ICD-10-CM | POA: Insufficient documentation

## 2010-08-26 DIAGNOSIS — M1990 Unspecified osteoarthritis, unspecified site: Secondary | ICD-10-CM | POA: Insufficient documentation

## 2010-08-26 DIAGNOSIS — E785 Hyperlipidemia, unspecified: Secondary | ICD-10-CM | POA: Insufficient documentation

## 2010-08-26 DIAGNOSIS — Z136 Encounter for screening for cardiovascular disorders: Secondary | ICD-10-CM

## 2010-08-26 DIAGNOSIS — E669 Obesity, unspecified: Secondary | ICD-10-CM | POA: Insufficient documentation

## 2010-08-26 DIAGNOSIS — I10 Essential (primary) hypertension: Secondary | ICD-10-CM | POA: Insufficient documentation

## 2010-08-26 DIAGNOSIS — Z7982 Long term (current) use of aspirin: Secondary | ICD-10-CM | POA: Insufficient documentation

## 2010-08-26 DIAGNOSIS — R0602 Shortness of breath: Secondary | ICD-10-CM | POA: Insufficient documentation

## 2010-08-26 LAB — COMPREHENSIVE METABOLIC PANEL
ALT: 28 U/L (ref 0–35)
AST: 29 U/L (ref 0–37)
Albumin: 3.8 g/dL (ref 3.5–5.2)
Alkaline Phosphatase: 85 U/L (ref 39–117)
BUN: 15 mg/dL (ref 6–23)
Chloride: 101 mEq/L (ref 96–112)
Potassium: 3.5 mEq/L (ref 3.5–5.1)
Sodium: 140 mEq/L (ref 135–145)
Total Bilirubin: 1 mg/dL (ref 0.3–1.2)
Total Protein: 7.5 g/dL (ref 6.0–8.3)

## 2010-08-26 LAB — POCT CARDIAC MARKERS
CKMB, poc: 3 ng/mL (ref 1.0–8.0)
Myoglobin, poc: 83.1 ng/mL (ref 12–200)
Troponin i, poc: <0.05 ng/mL (ref 0.00–0.09)

## 2010-08-26 LAB — URINALYSIS, ROUTINE W REFLEX MICROSCOPIC
Bilirubin Urine: NEGATIVE
Ketones, ur: NEGATIVE mg/dL
Protein, ur: NEGATIVE mg/dL
Urine Glucose, Fasting: NEGATIVE mg/dL
Urobilinogen, UA: 0.2 mg/dL (ref 0.0–1.0)

## 2010-08-26 LAB — DIFFERENTIAL
Basophils Absolute: 0.1 10*3/uL (ref 0.0–0.1)
Basophils Relative: 1 % (ref 0–1)
Lymphocytes Relative: 28 % (ref 12–46)
Lymphs Abs: 2.6 10*3/uL (ref 0.7–4.0)
Neutro Abs: 5.8 10*3/uL (ref 1.7–7.7)

## 2010-08-26 LAB — CBC
HCT: 41 % (ref 36.0–46.0)
Hemoglobin: 13.9 g/dL (ref 12.0–15.0)
MCH: 30.6 pg (ref 26.0–34.0)
RDW: 13.2 % (ref 11.5–15.5)
WBC: 9.2 10*3/uL (ref 4.0–10.5)

## 2010-08-26 LAB — URINE MICROSCOPIC-ADD ON

## 2010-08-26 LAB — D-DIMER, QUANTITATIVE: D-Dimer, Quant: 0.48 ug/mL-FEU (ref 0.00–0.48)

## 2010-08-30 NOTE — Assessment & Plan Note (Signed)
Summary: walk in chest pains/kn   Vital Signs:  Patient profile:   77 year old female Weight:      145.38 pounds O2 Sat:      93 % on Room air Pulse rate:   86 / minute Pulse rhythm:   regular BP sitting:   162 / 98  (left arm) Cuff size:   regular  Vitals Entered By: Army Fossa CMA (August 26, 2010 8:30 AM)  O2 Flow:  Room air CC: Pt here c/o CP- started yesterday Comments pain in hand "SOB" sometimes [facility] [address]   History of Present Illness:  patient walk-in 77 -year-old female  complaining of chest pain since yesterday.  pain is located at the left lower anterior chest, never goes completely away,  is worse by taking a deep breath, moving her torso, standing or walking.   food does not change her pain  she has mild heartburn which is at baseline    review of systems No fevers No cough or shortness of breath  she had mild nausea 2 days ago but that is resolved. No vomiting or diarrhea, stools without blood. No rash on her chest  no lower extremity edema or unusual pain.  Current Medications (verified): 1)  Carvedilol 6.25 Mg  Tabs (Carvedilol) .Marland Kitchen.. 1 By Mouth Two Times A Day 2)  Fosinopril Sodium 40 Mg Tabs (Fosinopril Sodium) .... Take 2  Tablet Daily 3)  Hydrochlorothiazide 25 Mg Tabs (Hydrochlorothiazide) .Marland Kitchen.. 1 By Mouth Qd 4)  Bayer Low Strength 81 Mg  Tbec (Aspirin) 5)  Oscal 500/200 D-3 500-200 Mg-Unit  Tabs (Calcium-Vitamin D) 6)  Centrum Silver   Tabs (Multiple Vitamins-Minerals) 7)  Arthritis Pain Reliever 650 Mg  Tbcr (Acetaminophen) 8)  Omega 3 Fish Oil  Allergies (verified): No Known Drug Allergies  Past History:  Past Medical History: Reviewed history from 08/17/2009 and no changes required. HYPERLIPIDEMIA  HYPERTENSION  HEMORRHOIDS   ?PMR Osteoarthritis  Past Surgical History: Reviewed history from 07/29/2008 and no changes required. hysterectomy Dr Lily Peer ?oophorectomy stress incontinence eval  and microhematuria  eval   Dr Patsi Sears 2004 Cataract extraction breast Bx (L) ---> benign , aprox 2005  Social History: Reviewed history from 04/19/2009 and no changes required. Widow  original from [place] lives w/ her  3 daughters Tobacco--no ETOH -- no  Physical Exam  General:  alert and well-developed.   no apparent distress Eyes:   not pale Chest Wall:   slightly tender at the left anterior lower chest purulent no rash noted Lungs:  normal respiratory effort, no intercostal retractions, no accessory muscle use, and normal breath sounds.   Heart:  normal rate, regular rhythm, and no murmur.   Abdomen:  soft, non-tender, no distention, no masses, no guarding, and no rigidity.   Extremities:  calves are symmetric and nontender to palpation   Impression & Recommendations:  Problem # 1:  CHEST PAIN, ATYPICAL (ICD-786.59)   patient presents with atypical chest pain  EKG today is essentially at baseline  and no acute changes  Differential diagnosis is large but includes CAD, PE, muscle skeletal pain, GERD, others.  I think she needs further workup today, I discussed the case  with the ER physician Dr. Renae Gloss who  accept the patient. Most likely she will need cardiac enzymes, chest x-ray, d-dimer, etc will go via private car  Orders: EKG w/ Interpretation (93000)  Complete Medication List: 1)  Carvedilol 6.25 Mg Tabs (Carvedilol) .Marland Kitchen.. 1 by mouth two times a day 2)  Fosinopril Sodium 40 Mg Tabs (Fosinopril sodium) .... Take 2  tablet daily 3)  Hydrochlorothiazide 25 Mg Tabs (Hydrochlorothiazide) .Marland Kitchen.. 1 by mouth qd 4)  Bayer Low Strength 81 Mg Tbec (Aspirin) 5)  Oscal 500/200 D-3 500-200 Mg-unit Tabs (Calcium-vitamin d) 6)  Centrum Silver Tabs (Multiple vitamins-minerals) 7)  Arthritis Pain Reliever 650 Mg Tbcr (Acetaminophen) 8)  Omega 3 Fish Oil  Prescriptions: HYDROCHLOROTHIAZIDE 25 MG TABS (HYDROCHLOROTHIAZIDE) 1 by mouth qd  #30 Tablet x 2   Entered by:   Army Fossa CMA   Authorized  by:   Nolon Rod. Yoshi Vicencio MD   Signed by:   Army Fossa CMA on 08/26/2010   Method used:   Electronically to        [facility] [address] [place] (retail)       [phone]       [place], [place]  [address]       Ph: [phone]       Fax: [phone]   RxID:   [id number] FOSINOPRIL SODIUM 40 MG TABS (FOSINOPRIL SODIUM) Take 2  tablet daily  #60 Tablet x 2   Entered by:   Army Fossa CMA   Authorized by:   Nolon Rod. Angeliz Settlemyre MD   Signed by:   Army Fossa CMA on 08/26/2010   Method used:   Electronically to        [facility] [address] [place] (retail)       [phone]       [place], [place]  [address]       Ph: [phone]       Fax: [phone]   RxID:   [id number] CARVEDILOL 6.25 MG  TABS (CARVEDILOL) 1 by mouth two times a day  #60 Tablet x 2   Entered by:   Army Fossa CMA   Authorized by:   Nolon Rod. Zalma Channing MD   Signed by:   Army Fossa CMA on 08/26/2010   Method used:   Electronically to        [facility] [address] [place] (retail)       [phone]       [place], [place]  [address]       Ph: [phone]       Fax: [phone]   RxID:   [id number]    Orders Added: 1)  EKG w/ Interpretation [93000] 2)  Est. Patient Level IV [29937]

## 2010-10-04 ENCOUNTER — Ambulatory Visit (INDEPENDENT_AMBULATORY_CARE_PROVIDER_SITE_OTHER): Payer: Medicare Other | Admitting: Internal Medicine

## 2010-10-04 ENCOUNTER — Encounter: Payer: Self-pay | Admitting: Internal Medicine

## 2010-10-04 DIAGNOSIS — R0789 Other chest pain: Secondary | ICD-10-CM

## 2010-10-04 DIAGNOSIS — Z0000 Encounter for general adult medical examination without abnormal findings: Secondary | ICD-10-CM

## 2010-10-04 DIAGNOSIS — M8580 Other specified disorders of bone density and structure, unspecified site: Secondary | ICD-10-CM | POA: Insufficient documentation

## 2010-10-04 DIAGNOSIS — M1990 Unspecified osteoarthritis, unspecified site: Secondary | ICD-10-CM

## 2010-10-04 DIAGNOSIS — I10 Essential (primary) hypertension: Secondary | ICD-10-CM

## 2010-10-04 DIAGNOSIS — Z01818 Encounter for other preprocedural examination: Secondary | ICD-10-CM | POA: Insufficient documentation

## 2010-10-04 DIAGNOSIS — M949 Disorder of cartilage, unspecified: Secondary | ICD-10-CM

## 2010-10-04 DIAGNOSIS — E785 Hyperlipidemia, unspecified: Secondary | ICD-10-CM

## 2010-10-04 NOTE — Progress Notes (Signed)
  Subjective:    Patient ID: Alison Graves, female    DOB: 1932-02-27, 77 y.o.   MRN: [id number]  HPI Complete physical exam, chart reviewed Was seen with chest pain 08-26-10, was sent to the ER, records reviewed, all labs and EKG wnl CP resolved  HTN,  good medication compliance and good amb BPs High chol, on O3FA Osteopenia --  good medication compliance w/ ca and Vit D   Past Medical History  Diagnosis Date  . Hyperlipemia   . Hemorrhoids   . PMR (polymyalgia rheumatica)     ?  Marland Kitchen Osteoarthritis   . Microhematuria     s/p eval DR Patsi Sears 2004  . HTN (hypertension)    Past Surgical History  Procedure Date  . Laparoscopic hysterectomy     per Dr Lily Peer, ?oophorectomy  . Cataract extraction   . Breast biopsy 2005    benign   Family History  Problem Relation Age of Onset  . Diabetes      grandmother  . Coronary artery disease Mother   . Stroke Neg Hx   . Hypertension Neg Hx   . Cancer Neg Hx    History   Social History  . Marital Status: Widowed    Spouse Name: N/A    Number of Children: 3  . Years of Education: N/A   Occupational History  . retired     Social History Main Topics  . Smoking status: Never Smoker   . Smokeless tobacco: Not on file  . Alcohol Use: No  . Drug Use: Not on file  . Sexually Active: Not on file   Other Topics Concern  . Not on file   Social History Narrative   Widow,  Original from [place] , lives w/ her 3 daughters     Review of Systems  Constitutional: Negative for fever. Fatigue: some fatigue.  Respiratory: Negative for cough and shortness of breath.   Cardiovascular: Leg swelling: occ swelling , ankles   Gastrointestinal: Negative for nausea, vomiting, diarrhea and blood in stool.  Genitourinary: Negative for dysuria, hematuria and vaginal bleeding.  Neurological: Negative for syncope and headaches.  Psychiatric/Behavioral:       Mild anxiety ("all life")       Objective:   Physical Exam    Constitutional: She is oriented to person, place, and time. She appears well-developed and well-nourished.  HENT:  Head: Normocephalic and atraumatic.  Neck: Neck supple. No tracheal deviation present. No thyromegaly present.  Cardiovascular: Normal rate, regular rhythm and normal heart sounds.   No murmur heard. Pulmonary/Chest: Breath sounds normal. No respiratory distress. She has no wheezes. She has no rales.  Abdominal: Soft. Bowel sounds are normal. She exhibits no distension. There is no tenderness. There is no rebound.  Genitourinary: No breast swelling, tenderness, discharge or bleeding.  Musculoskeletal: She exhibits no edema.  Neurological: She is alert and oriented to person, place, and time.  Psychiatric: She has a normal mood and affect. Her behavior is normal. Judgment and thought content normal.          Assessment & Plan:

## 2010-10-04 NOTE — Assessment & Plan Note (Signed)
Cont present care Check Vit D

## 2010-10-04 NOTE — Assessment & Plan Note (Signed)
C/o Knee pain, likes to see ortho, referal done

## 2010-10-04 NOTE — Assessment & Plan Note (Signed)
Resolved , ER w/u neg

## 2010-10-04 NOTE — Assessment & Plan Note (Signed)
On no meds, labs 

## 2010-10-04 NOTE — Assessment & Plan Note (Signed)
At goal, no change. 

## 2010-10-04 NOTE — Assessment & Plan Note (Addendum)
Td-- 2-11 pneumonia shot 2010 shingles shot 3-10  Gyn---- Dr Lily Peer, last OV > 2 years ago MMG 9-10 neg, ordering one today Breast exam today neg, rec SBE  Cscope  [phone] Dr Loreta Ave, Bx no adenomatous changes   DEXA 10-11, osteopenia Tscore-1.1, was rec Ca, Vit D, repeat in 2 years

## 2010-10-05 LAB — LIPID PANEL
Cholesterol: 230 mg/dL — ABNORMAL HIGH (ref 0–200)
HDL: 57.8 mg/dL (ref >39.00)
Total CHOL/HDL Ratio: 4
Triglycerides: 192 mg/dL — ABNORMAL HIGH (ref 0.0–149.0)
VLDL: 38.4 mg/dL (ref 0.0–40.0)

## 2010-10-06 ENCOUNTER — Encounter: Payer: Self-pay | Admitting: Internal Medicine

## 2010-11-08 ENCOUNTER — Telehealth: Payer: Self-pay | Admitting: Internal Medicine

## 2010-11-09 NOTE — Telephone Encounter (Signed)
Noted, unable to refer patient to orthopedic surgery

## 2010-11-18 NOTE — Assessment & Plan Note (Signed)
[facility]                                   ON-CALL NOTE   MARNE, MELINE                     MRN:          [id number]  DATE:03/04/2006                            DOB:          08/03/1931    TIME OF SERVICE:  8:25 p.m.   PHONE NUMBER:  [phone].   CALLER:  Patient's daughter.   REASON FOR CALL:  Patient has back pain which is new for her, with pain from  the neck after taking a nap this afternoon because her back was hurting and  she did not feel well.  When she woke up with the pain in her neck, the  blood pressure was taken and was 196/104.  Repeated a few minutes later was  184/104.  She was last seen two months ago by Dr. Blossom Hoops.  In the  meantime has had cataract surgery and a colonoscopy.  She has been on blood  pressure medicine since 77 years of age and has not missed any.  She has had  no trauma recently.  Has had no history of back pain.  Urination is okay,  and she has not fallen.   ASSESSMENT:  Back pain with neck pain after sleeping and elevated blood  pressure.   PLAN:  She says she has not missed any blood pressure medications.  Would  try and treat the back pain with Tylenol 325 mg, two every four hours.  If  her blood pressure is still high in the morning, I would then take her to  the emergency room to be seen.  She will need to be evaluated.  If her  symptoms are worse overnight, I would take her to the emergency room at that  time.   PRIMARY CARE PHYSICIAN:  Leanne Chang, M.D.  Home office is [place].                                   Arta Silence, MD   RNS/MedQ  DD:  03/04/2006  DT:  03/05/2006  Job #:  [id number]   cc:   Leanne Chang, M.D.

## 2010-11-18 NOTE — Op Note (Signed)
NAME:  Alison Graves, Alison Graves              ACCOUNT NO.:  [id number]   MEDICAL RECORD NO.:  [id number]          PATIENT TYPE:  AMB   LOCATION:  ENDO                         FACILITY:  [facility]   PHYSICIAN:  Anselmo Rod, M.D.  DATE OF BIRTH:  1931/07/05   DATE OF PROCEDURE:  02/23/2006  DATE OF DISCHARGE:  02/23/2006                                 OPERATIVE REPORT   PROCEDURE PERFORMED:  Colonoscopy with core biopsies x1.   ENDOSCOPIST:  Anselmo Rod, M.D.   INSTRUMENT USED:  Olympus video colonoscope.   INDICATIONS FOR PROCEDURE:  A 77 year old Hispanic female underwent a  screening colonoscopy.  Patient also has a history of abdominal pain.  Rule  out colonic polyps, masses, etc.   PRE-PROCEDURE PREPARATION:  Informed consent was procured from the patient.  The patient was fasted for eight hours prior to the procedure and prepped  with a gallon of Trilyte the night prior to the procedure.  Risks and  benefits of the procedure, including a 10% missed rate of cancer and polyps,  were discussed with the patient and her daughter, as the patient did not  speak English very well.   PRE-PROCEDURE PHYSICAL EXAMINATION:  VITAL SIGNS:  Stable vital signs.  NECK: Supple.  CHEST: Clear to auscultation.  HEART: S1 and S2.  Regular.  ABDOMEN: Soft with normal bowel sounds.   DESCRIPTION OF PROCEDURE:  The patient was placed in the left lateral  decubitus position and sedated with 100 mcg of fentanyl and 10 mg of Versed  in slow, incremental doses.  Once the patient was adequately sedated,  maintained on low flow oxygen, and continuous cardiac monitoring, the  Olympus video colonoscope was advanced from the rectum to the cecum.  There  was a significant amount of residual stool in the colon.  Multiple washes  were done.  There was evidence of visceral hypersensitivity, as the patient  had significant abdominal pain with insufflation of air into the colon.  A  small sessile polyp was biopsied  in the rectosigmoid colon.  The rest of the  exam was unremarkable.  Retroflexion in the rectum revealed no  abnormalities.  The patient tolerated the procedure well without  complications.   IMPRESSION:  1.A small sessile polyp, biopsied, in the rectosigmoid colon,  otherwise unremarkable examination up to the cecum.  2.Patient had significant abdominal discomfort with insufflation of air into  the colon, indicating a component of visceral hypersensitivity;?irritable  bowel syndrome.   RECOMMENDATIONS:  1.Continue a high fiber diet with liberal fluid intake.  2.Repeat colonoscopy in the next 10 years unless the patient has any  abnormal symptoms in the interim.  3.Outpatient followup as the needs arise in the future.      Anselmo Rod, M.D.  Electronically Signed     JNM/MEDQ  D:  02/26/2006  T:  02/26/2006  Job:  [id number]   cc:   Gaetano Hawthorne. Lily Peer, M.D.

## 2010-11-18 NOTE — Assessment & Plan Note (Signed)
[facility]                                 ON-CALL NOTE   MALAYLA, GRANBERRY                       MRN:          [id number]  DATE:04/09/2007                            DOB:          Oct 03, 1931    Daughter, Ronda Fairly, calling because her mother, who is a patient of  Dr. Blossom Hoops, has hypertension.  She took a 2nd pill inadvertently, but  her blood pressure went up.  She said it is 190/90.  Patient is  asymptomatic.  Advised bed rest today.  Call the office and be seen  tomorrow for evaluation.     Jeffrey A. Tawanna Cooler, MD  Electronically Signed    JAT/MedQ  DD: 04/09/2007  DT: 04/10/2007  Job #: [id number]

## 2010-11-18 NOTE — Discharge Summary (Signed)
[facility]  Patient:    Alison Graves, Alison Graves Visit Number: [id number] MRN: [id number]          Service Type: GYN Location: [id number] Attending Physician:  Tonye Royalty Dictated by:   Antony Contras, [facility] Admit Date:  07/09/2001 Discharge Date: 07/11/2001                             Discharge Summary  DISCHARGE DIAGNOSES:          1. Paravaginal prolapse.                               2. Cystocele.                               3. Rectocele.  PROCEDURES:                   Anterior colporrhaphy, posterior colporrhaphy, transvaginal colposacropexy.  HISTORY OF PRESENT ILLNESS:   The patient is a 77 year old gravida 5, para 5 referred to [facility] Gyne[facility] by Dr. Manuela Schwartz due to complaints of uterine prolapse.  She has had a pressure sensation and stress urinary incontinence which has gotten worse over the last year.  On questioning, she stated her deliveries were vaginal without any complications except her largest baby weighed 9 pounds.  PAST MEDICAL HISTORY:         History of hypertension, arthritis, DJD of the spine, grade 2-3 cystocele with first- to second-degree rectocele and vaginal prolapse which was noted on exam in the gynecologic office.  ALLERGIES:                    She denies any allergies.  MEDICATIONS:                  1. Aspirin.                               2. Centrum multivitamin.                               3. Os-Cal.                               4. Dyazide 37.5/25 mg q.d.                               5. Monopril 10 mg q.d.                               6. Premarin 0.625 mg q.d.  PAST SURGICAL HISTORY:        Total abdominal hysterectomy with ovarian conservation done in her native country of [place].  HOSPITAL COURSE AND TREATMENT:                    The patient was admitted for definitive therapy of anterior colporrhaphy, posterior colporrhaphy, and transvaginal colposacropexy was  performed by Dr. Lily Peer, assisted by Dr. Audie Box under general anesthesia.  Postoperatively, the patient had no major difficulties, other than a low-grade  temperature of 100.3.  By her second postoperative day, she was voiding spontaneously.  She was able to be discharged on her third postoperative day in satisfactory condition.  DISPOSITION:                  The patient is to be followed up in the office for a postoperative check. Dictated by:   Antony Contras, [facility] Attending Physician:  Tonye Royalty DD:  08/01/01 TD:  08/02/01 Job: [id number]

## 2010-11-18 NOTE — Op Note (Signed)
NAMEMARCIANNA, Alison Graves              ACCOUNT NO.:  [id number]   MEDICAL RECORD NO.:  [id number]          PATIENT TYPE:  AMB   LOCATION:  DSC                          FACILITY:  [facility]   PHYSICIAN:  Rose Phi. Maple Hudson, M.D.   DATE OF BIRTH:  12-Feb-1932   DATE OF PROCEDURE:  06/06/2006  DATE OF DISCHARGE:                               OPERATIVE REPORT   PREOPERATIVE DIAGNOSIS:  Intraductal papilloma of the left breast.   POSTOPERATIVE DIAGNOSIS:  Intraductal papilloma of the left breast.   OPERATION:  Excision of ductal system of the left breast.   SURGEON:  Rose Phi. Maple Hudson, M.D.   ANESTHESIA:  General.   OPERATIVE PROCEDURE:  The patient was placed on the operating table and  the left breast prepped and draped in the usual fashion with her arms  extended out.  The papilloma was known, on ductogram, to be at about the  5 o'clock position and this was also the area where we were still having  serous drainage.  A curved circumareolar incision centered at the 5  o'clock position was then outlined and I thoroughly infiltrated the area  with the local anesthetic.  An incision was made and an areolar flap was  dissected up and then this wedged the duct system was excised.  Hemostasis was obtained with cautery.  Subcuticular closure of 4-0  Monocryl and Steri-Strips was carried out.  A dressing was applied.  The  patient was transferred to the recovery room in satisfactory condition  having tolerated procedure well.      Rose Phi. Maple Hudson, M.D.  Electronically Signed     PRY/MEDQ  D:  06/06/2006  T:  06/06/2006  Job:  [id number]

## 2010-11-18 NOTE — Op Note (Signed)
[facility]  Patient:    Alison Graves, Alison Graves I Visit Number: [id number] MRN: [id number]          Service Type: GYN Location: [id number] Attending Physician:  Tonye Royalty Dictated by:   Gaetano Hawthorne. Lily Peer, M.D. Proc. Date: 07/09/01 Admit Date:  07/09/2001                             Operative Report  HISTORY:                      The patient is a 77 year old gravida 5, para 5 with symptomatic uterine prolapse.  Patient with previous transabdominal hysterectomy with ovarian conservation many years ago.  Patient also complains of some mild stress urinary incontinence.  PREOPERATIVE DIAGNOSES:       1. Total vaginal prolapse.                               2. Cystocele.                               3. Rectocele.  POSTOPERATIVE DIAGNOSES:      1. Total vaginal prolapse.                               2. Cystocele.                               3. Rectocele.  ANESTHESIA:                   General endotracheal.  SURGEON:                      Juan H. Lily Peer, M.D.  FIRST ASSISTANT:              Timothy P. Fontaine, M.D.  PROCEDURE:                    1. Anterior colporrhaphy.                               2. Posterior colporrhaphy.                               3. Transvaginal colposacropexy.  DESCRIPTION OF OPERATION:     After the patient was adequately counseled she was taken to the operating room where she underwent a successful general endotracheal anesthesia.  She was placed in the high lithotomy position.  The vagina and perineum were prepped and draped in the usual sterile fashion and a Foley catheter was inserted into the bladder for monitorization of urinary output.  The first portion of the operation consisted of proceeding with a cystocele repair.  The vaginal cuff area was grasped with two Allis clamps and brought into view due to the laxity of the tissue from the prolapse.  This was quite easily achieved.  A small  elliptical incision was made horizontally and the strip of mucosa was excised.  With scissors dissection of the vaginal mucosa in the midline to the region of the external urethral meatus  was accomplished.  Traction was maintained in the vagina along the course of dissection to keep the wall separate from the vaginal mucosa.  Sharp dissection of the adherent fascia from the vaginal mucosa was accomplished. The dissection was deep enough to reach the wide, relatively avascular plane beneath the vaginal epithelium.  With blunt finger dissection and with a Raytec gauze sufficient traction was provided along the fascia to free up any mucosa beneath the urethra and the bladder.  Beginning of the external meatus successive vertical matris sutures of 2-0 Vicryl suture were placed in the mobilized periurethral fascia.  A Kelly clamp was used to depress the floor of the urethra as the sutures were tied in an effort to avoid necrosis of the wall of the urethra.  The last suture at the bladder neck was anchored to the posterior suburethral ligament with a 2-0 Vicryl as well.  The periurethral fascia was brought together beneath the urethra, elevating the posterior urethra to a more retropubic position.  Repair of the cystocele was accomplished with another layer of 2-0 Vicryl suture in a vertical matris fashion and the mobilized perivesical fascia.  The normal urethrovesical anatomy was once restored and the excess vaginal mucosa was trimmed off and excised.  The mucosa margins were approximated in the midline with a running stitch of 3-0 Vicryl suture including the underlying fascia.  The second portion of the procedure consisted of a posterior colporrhaphy.  Two Allis clamps were placed at the introitus over the perineal body and a V-shaped incision was made.  A midline vertical incision was made extending to the apex of the vaginal cuff.  The perirectal fascia was separated from the mucosa by sharp  dissection.  Before excising redundant vaginal mucosa and before the plication of the anterior wall over the rectum, the sacrospinous fixation part of the procedure was started at this point.  There was no enterocele identified at this point.  With the use of the retractor the rectum was displaced to the patients left.  The right ischial spine was palpated.  A window was established to the descending rectal septal over the ischial spine with the surgeons finger via blunt penetration.  At this point the sacrospinous ligament/coccygeus muscle complex was palpated as well as was seen under visualization with the assistance of appropriate light into the space and was visualized at a course from the ischial spine towards the region of the sacrum.  After palpating the ischial spine approximately 2-3 cm medial to the ischial spine, the sacrospinous ligament/coccygeus muscle complex was grasped with a Babcock clamp and was penetrated with a needle with its appropriate needle holder through this ligament with 0 Prolene.  Attention was placed after the suture penetrated the sacrospinous ligament that the patient was able to be moved slightly when traction was placed on the suture indicating proper placement of the suture through the substance of the sacrospinous ligament (the shutt suture punch instrument was used for this purpose).  At this point now of the operation, both ends of the suture were kept on a hemostat and the perirectal fascia which was previously separated from the mucosa by sharp dissection was then plicated over the anterior wall of the rectum using 2-0 Vicryl suture.  The levator ani muscles were approximated in the midline with deep sutures of the 2-0 Vicryl suture and before complete closure of the posterior colporrhaphy the excess vaginal mucosa was then trimmed.  It was at this point that with a free needle the  undersurface mid portion of the vaginal vault was utilized to bring  the posterior vaginal cuff to the sacrospinous ligament.  The suture was tied bringing the posterior vaginal cuff to the sacrospinous ligament and was  secured with multiple ties of the 0 Prolene suture.  Once this was accomplished the rectal mucosa was then reapproximated with a running stitch of 3-0 Vicryl suture.  The vagina was irrigated with normal saline solution and the vagina was packed with small Kerlix impregnated with clindamycin cream.  The patient did receive 2 g Cefotan prophylactically and did well intraoperatively.  Her blood loss from the procedure was 200 cc.  IV fluids was 2400 cc lactated Ringers.  Urine output was 300 cc and clear.  The patient was extubated, transferred to recovery room in with stable vital signs. Dictated by:   Gaetano Hawthorne Lily Peer, M.D. Attending Physician:  Tonye Royalty DD:  07/09/01 TD:  07/10/01 Job: [id number]

## 2011-01-05 ENCOUNTER — Other Ambulatory Visit: Payer: Self-pay | Admitting: Internal Medicine

## 2011-01-05 NOTE — Telephone Encounter (Signed)
Patient's daughter called to check status--patient is out of meds

## 2011-01-09 ENCOUNTER — Other Ambulatory Visit: Payer: Self-pay | Admitting: *Deleted

## 2011-01-09 MED ORDER — CARVEDILOL 6.25 MG PO TABS: 6.2500 mg | ORAL_TABLET | Freq: Two times a day (BID) | ORAL | Status: DC

## 2011-01-31 ENCOUNTER — Other Ambulatory Visit: Payer: Self-pay | Admitting: Internal Medicine

## 2011-01-31 DIAGNOSIS — Z1231 Encounter for screening mammogram for malignant neoplasm of breast: Secondary | ICD-10-CM

## 2011-02-06 ENCOUNTER — Other Ambulatory Visit: Payer: Self-pay | Admitting: Internal Medicine

## 2011-02-07 ENCOUNTER — Ambulatory Visit: Payer: Medicare Other

## 2011-03-30 ENCOUNTER — Ambulatory Visit
Admission: RE | Admit: 2011-03-30 | Discharge: 2011-03-30 | Disposition: A | Discharge status: Home / Self Care | Payer: Medicare Other | Source: Ambulatory Visit | Attending: Internal Medicine | Admitting: Internal Medicine

## 2011-03-30 DIAGNOSIS — Z1231 Encounter for screening mammogram for malignant neoplasm of breast: Secondary | ICD-10-CM

## 2011-04-05 ENCOUNTER — Encounter: Payer: Self-pay | Admitting: Internal Medicine

## 2011-04-05 ENCOUNTER — Ambulatory Visit (INDEPENDENT_AMBULATORY_CARE_PROVIDER_SITE_OTHER): Payer: Medicare Other | Admitting: Internal Medicine

## 2011-04-05 VITALS — BP 144/88 | HR 80 | Temp 98.1°F | Resp 14 | Wt 144.2 lb

## 2011-04-05 DIAGNOSIS — I10 Essential (primary) hypertension: Secondary | ICD-10-CM

## 2011-04-05 MED ORDER — CARVEDILOL 12.5 MG PO TABS: 12.5000 mg | ORAL_TABLET | Freq: Two times a day (BID) | ORAL | Status: DC

## 2011-04-05 NOTE — Progress Notes (Signed)
  Subjective:    Patient ID: Alison Graves, female    DOB: 05-Feb-1932, 77 y.o.   MRN: [id number]  HPI ROV Doing well, no major concerns.  Past Medical History  Diagnosis Date  . Hyperlipemia   . Hemorrhoids   . PMR (polymyalgia rheumatica)     ?  Marland Kitchen Osteoarthritis   . Microhematuria     s/p eval DR Patsi Sears 2004  . HTN (hypertension)    Past Surgical History  Procedure Date  . Laparoscopic hysterectomy     per Dr Lily Peer, ?oophorectomy  . Cataract extraction   . Breast biopsy 2005    benign     Review of Systems Ambulatory systolic blood pressure usually in the 130s, often times goes up to the 150s. Good medication compliance with BP meds, calcium, vitamin D. Denies any headaches, chest pain, shortness of breath. Occasionally has dyspnea on exertion. No pretibial edema.     Objective:   Physical Exam  Constitutional: She is oriented to person, place, and time. She appears well-developed and well-nourished.  Cardiovascular: Normal rate, regular rhythm and normal heart sounds.   No murmur heard. Pulmonary/Chest: Effort normal and breath sounds normal. No respiratory distress. She has no wheezes. She has no rales.  Musculoskeletal: She exhibits no edema.  Neurological: She is alert and oriented to person, place, and time.          Assessment & Plan:

## 2011-04-05 NOTE — Patient Instructions (Signed)
Tome el nuevo coreg que es un poco mas fuerte tomese la presion 4 veces por semana, si le sale mas de 140/85 la mayoria del tiempo por favor avisenos regerese en 6 meses para su chequeo general

## 2011-04-05 NOTE — Assessment & Plan Note (Signed)
Ambulatory blood pressures often times in the 150s, will increase Coreg dose from 6 to 12.5. Asked patient to check her blood pressure 4 times a week and call me if it is more than 140/85, instructions provided in Spanish.

## 2011-06-03 ENCOUNTER — Other Ambulatory Visit: Payer: Self-pay | Admitting: Internal Medicine

## 2011-07-21 ENCOUNTER — Other Ambulatory Visit: Payer: Self-pay | Admitting: Internal Medicine

## 2011-07-24 NOTE — Telephone Encounter (Signed)
rx sent to pharmacy by e-script  

## 2011-10-05 ENCOUNTER — Other Ambulatory Visit: Payer: Self-pay | Admitting: Internal Medicine

## 2011-10-05 NOTE — Telephone Encounter (Signed)
Refill done.  

## 2011-10-07 DIAGNOSIS — Z23 Encounter for immunization: Secondary | ICD-10-CM | POA: Diagnosis not present

## 2011-11-04 ENCOUNTER — Other Ambulatory Visit: Payer: Self-pay | Admitting: Internal Medicine

## 2011-11-06 NOTE — Telephone Encounter (Signed)
Refill done.  

## 2011-11-14 ENCOUNTER — Encounter (HOSPITAL_COMMUNITY): Payer: Self-pay | Admitting: *Deleted

## 2011-11-14 ENCOUNTER — Emergency Department (INDEPENDENT_AMBULATORY_CARE_PROVIDER_SITE_OTHER)
Admission: EM | Admit: 2011-11-14 | Discharge: 2011-11-14 | Disposition: A | Discharge status: Home / Self Care | Payer: Medicare Other | Source: Home / Self Care | Attending: Family Medicine | Admitting: Family Medicine

## 2011-11-14 DIAGNOSIS — I10 Essential (primary) hypertension: Secondary | ICD-10-CM

## 2011-11-14 DIAGNOSIS — R51 Headache: Secondary | ICD-10-CM

## 2011-11-14 LAB — POCT I-STAT, CHEM 8
BUN: 17 mg/dL (ref 6–23)
Creatinine, Ser: 0.8 mg/dL (ref 0.50–1.10)
Glucose, Bld: 109 mg/dL — ABNORMAL HIGH (ref 70–99)
Hemoglobin: 15.3 g/dL — ABNORMAL HIGH (ref 12.0–15.0)
Sodium: 140 mEq/L (ref 135–145)
TCO2: 29 mmol/L (ref 0–100)

## 2011-11-14 MED ORDER — FUROSEMIDE 40 MG PO TABS
40.0000 mg | ORAL_TABLET | Freq: Once | ORAL | Status: AC
  Administered 2011-11-14: 40 mg via ORAL

## 2011-11-14 MED ORDER — FUROSEMIDE 40 MG PO TABS
ORAL_TABLET | ORAL | Status: AC
  Filled 2011-11-14: qty 1

## 2011-11-14 NOTE — ED Provider Notes (Signed)
History     CSN: [id number]  Arrival date & time 11/14/11  4098   First MD Initiated Contact with Patient 11/14/11 2003      Chief Complaint  Patient presents with  . Headache    (Consider location/radiation/quality/duration/timing/severity/associated sxs/prior treatment) Patient is a 78 y.o. female presenting with hypertension. The history is provided by the patient and a relative.  Hypertension This is a chronic problem. Episode onset: has been nl but today found to be markedly elevated, has eaten a lot of salt and been under stress and anxiety over weekend. The problem has been resolved. Pertinent negatives include no chest pain, no abdominal pain, no headaches and no shortness of breath.    Past Medical History  Diagnosis Date  . Hyperlipemia   . Hemorrhoids   . PMR (polymyalgia rheumatica)     ?  Marland Kitchen Osteoarthritis   . Microhematuria     s/p eval DR Patsi Sears 2004  . HTN (hypertension)     Past Surgical History  Procedure Date  . Laparoscopic hysterectomy     per Dr Lily Peer, ?oophorectomy  . Cataract extraction   . Breast biopsy 2005    benign    Family History  Problem Relation Age of Onset  . Diabetes      grandmother  . Coronary artery disease Mother   . Stroke Neg Hx   . Hypertension Neg Hx   . Cancer Neg Hx     History  Substance Use Topics  . Smoking status: Never Smoker   . Smokeless tobacco: Not on file  . Alcohol Use: No    OB History    Grav Para Term Preterm Abortions TAB SAB Ect Mult Living                  Review of Systems  Constitutional: Negative.   HENT: Negative.   Respiratory: Negative for chest tightness and shortness of breath.   Cardiovascular: Negative for chest pain.  Gastrointestinal: Negative for abdominal pain.  Neurological: Negative for dizziness, weakness, light-headedness, numbness and headaches.    Allergies  Review of patient's allergies indicates no known allergies.  Home Medications   Current  Outpatient Rx  Name Route Sig Dispense Refill  . ACETAMINOPHEN ER 650 MG PO TBCR Oral Take 650 mg by mouth every 8 (eight) hours as needed.      . ASPIRIN 81 MG PO TABS Oral Take 81 mg by mouth daily.      Marland Kitchen CALCIUM 600 + D PO Oral Take by mouth.      Marland Kitchen CARVEDILOL 12.5 MG PO TABS Oral Take 1 tablet (12.5 mg total) by mouth 2 (two) times daily. 60 tablet 11  . OMEGA-3 FATTY ACIDS 1000 MG PO CAPS Oral Take 4 g by mouth daily.     . FOSINOPRIL SODIUM 20 MG PO TABS  TAKE FOUR TABLETS BY MOUTH EVERY DAY 120 tablet 3  . HYDROCHLOROTHIAZIDE 25 MG PO TABS  TAKE ONE TABLET BY MOUTH EVERY DAY 30 tablet 4  . MULTIVITAMIN PO Oral Take by mouth.        BP 209/87  Pulse 74  Temp(Src) 99.2 F (37.3 C) (Oral)  Resp 20  SpO2 98%  Physical Exam  Nursing note and vitals reviewed. Constitutional: She is oriented to person, place, and time. She appears well-developed and well-nourished.  HENT:  Head: Normocephalic.  Eyes: Pupils are equal, round, and reactive to light.  Neck: Normal range of motion. Neck supple.  Cardiovascular: Normal rate,  regular rhythm, normal heart sounds and intact distal pulses.   Pulmonary/Chest: Effort normal and breath sounds normal.  Musculoskeletal: She exhibits no edema.  Lymphadenopathy:    She has no cervical adenopathy.  Neurological: She is alert and oriented to person, place, and time.  Skin: Skin is warm and dry.    ED Course  Procedures (including critical care time)  Labs Reviewed  POCT I-STAT, CHEM 8 - Abnormal; Notable for the following:    Glucose, Bld 109 (*)    Hemoglobin 15.3 (*)    All other components within normal limits   No results found.   1. Hypertension       MDM          Linna Hoff, MD 11/14/11 2137

## 2011-11-14 NOTE — ED Notes (Signed)
Pt  Reports  Symptoms  Of  dizzyness   Headache   Neck pain  Today  denys  Any  Chest pain or  Shortness  Of  Breath     Did  Not  Black out or  Lose  concoussness   She  Is  Awake  And  Alert and  Oriented

## 2011-11-14 NOTE — ED Notes (Signed)
Called to waiting area to triage this pt. Pt CAO x 4, speaks in full multiple word sentences. Skin warm & dry. Pt does have h/o HTN. Pt's only complaint is "a little bit of neck pain". Denies CP, nausea, diaphoresis, blurred vision, dizziness. Pt and daughter instructed to inform registration staff if anything changed w/ her present condition/complaint. They verbalized their understanding of this.

## 2011-11-14 NOTE — Discharge Instructions (Signed)
Go to see dr Drue Novel in am for blood pressure recheck .

## 2011-11-15 ENCOUNTER — Ambulatory Visit (INDEPENDENT_AMBULATORY_CARE_PROVIDER_SITE_OTHER): Payer: Medicare Other | Admitting: *Deleted

## 2011-11-15 VITALS — BP 160/82

## 2011-11-15 DIAGNOSIS — I10 Essential (primary) hypertension: Secondary | ICD-10-CM

## 2011-11-16 ENCOUNTER — Encounter: Payer: Self-pay | Admitting: Internal Medicine

## 2011-11-16 ENCOUNTER — Telehealth: Payer: Self-pay | Admitting: Internal Medicine

## 2011-11-16 ENCOUNTER — Ambulatory Visit (INDEPENDENT_AMBULATORY_CARE_PROVIDER_SITE_OTHER): Payer: Medicare Other | Admitting: Internal Medicine

## 2011-11-16 VITALS — BP 142/82 | HR 80 | Temp 98.2°F | Wt 142.0 lb

## 2011-11-16 DIAGNOSIS — I10 Essential (primary) hypertension: Secondary | ICD-10-CM | POA: Diagnosis not present

## 2011-11-16 DIAGNOSIS — F411 Generalized anxiety disorder: Secondary | ICD-10-CM | POA: Diagnosis not present

## 2011-11-16 DIAGNOSIS — F419 Anxiety disorder, unspecified: Secondary | ICD-10-CM | POA: Insufficient documentation

## 2011-11-16 MED ORDER — ESCITALOPRAM OXALATE 10 MG PO TABS: 5.0000 mg | ORAL_TABLET | Freq: Every day | ORAL | Status: DC

## 2011-11-16 NOTE — Telephone Encounter (Signed)
[facility] Triage Call Report Triage Record Num: [id number] Operator: Audelia Hives Patient Name: Alison Graves Call Date & Time: 11/14/2011 6:10:17PM Patient Phone: [phone] PCP: Nolon Rod. Paz Patient Gender: Female PCP Fax : Patient DOB: 01-17-1932 Practice Name: [facility] Reason for Call: Caller: Ruth/Other; PCP: Willow Ora; CB#: [phone]; Call regarding High blood pressure; Calling regarding high blood pressure, current BP is 204/112, been high all day 11/14/11. Other readings include 180/103. Some confusion reported per daughter. Emergent s/s for Hypertension, Diagnosed or Suspected r/o per protocol except for see in 4 hrs due to systolic greater than 180. Will proceed to UC. Protocol(s) Used: Hypertension, Diagnosed or Suspected Recommended Outcome per Protocol: See Provider within 4 hours Reason for Outcome: Systolic blood pressure of more than 180 mmHg OR diastolic blood pressure of more than 120 mmHg Care Advice: ~ Another adult should drive. Call EMS 911 if new symptoms develop, such as severe shortness of breath, chest pain, change in mental status, acute neurologic deficit, seizure, visual disturbances, pulse rate > 120 / minute, or very irregular pulse. ~ ~ HEALTH PROMOTION / MAINTENANCE Medication Advice: - Discontinue all nonprescription and alternative medications, especially stimulants, until evaluated by provider. - Take prescribed medications as directed, following label instructions for the medication. - Do not change medications or dosing regimen until provider is consulted. - Know possible side effects of medication and what to do if they occur. - Tell provider all prescription, nonprescription or alternative medications that you take ~

## 2011-11-16 NOTE — Telephone Encounter (Signed)
Pt has an appt today.  

## 2011-11-16 NOTE — Progress Notes (Signed)
  Subjective:    Patient ID: Alison Graves, female    DOB: 10/07/1931, 78 y.o.   MRN: [id number]  HPI Urgent care followup. The patient has a history of hypertension, BP was usually well controlled around 120/85 with a pulse of 70.  3 days ago noted her BP to be elevated, 2 days ago noted to be as high as 209/87, she did have some mild headache and dizziness. She went to the urgent care, records reviewed, hemoglobin normal, BMP is satisfactory. She was recommended to followup here. On further questioning, she ate a lot of salt 4 days ago (mother's day) . Also, she is very nervous, according to the daughter she has been "nervous all her life about everything"  Past Medical History  Diagnosis Date  . Hyperlipemia   . Hemorrhoids   . PMR (polymyalgia rheumatica)     ?  Marland Kitchen Osteoarthritis   . Microhematuria     s/p eval DR Patsi Sears 2004  . HTN (hypertension)    Past Surgical History  Procedure Date  . Laparoscopic hysterectomy     per Dr Lily Peer, ?oophorectomy  . Cataract extraction   . Breast biopsy 2005    benign     Review of Systems At this point, she is feeling better, headache has resolved, no further dizziness. Denies any chest pain or shortness of breath Also denies depression, she does have some difficulty sleeping, mostly having a difficult time falling asleep.     Objective:   Physical Exam  General -- alert, well-developed, and well-nourished.   Neck --no thyromegaly  Lungs -- normal respiratory effort, no intercostal retractions, no accessory muscle use, and normal breath sounds.   Heart-- normal rate, regular rhythm, no murmur, and no gallop.   Extremities-- no pretibial edema bilaterally Neurologic-- alert & oriented X3 , neck is full range of motion, speech fluent, memory normal, gait is steady and  limited only by osteoarthritis. strength normal in all extremities. Psych-- Cognition and judgment appear intact. Alert and cooperative with normal attention  span and concentration.  not anxious appearing and not depressed appearing.       Assessment & Plan:

## 2011-11-16 NOTE — Patient Instructions (Signed)
Check the  blood pressure 2 or 3 times a week, be sure it is between 110/60 and 140/85. If it is consistently higher or lower, let me know  

## 2011-11-16 NOTE — Assessment & Plan Note (Addendum)
Here with her daughter, they both report that the patient has been anxious "all her life about everything". We have not discussed this issue before and today we talked about observation versus treatment. She would like to try something.  Plan: Low dose Lexapro daily. Reassess next month. Recommend to take it at night so hopefully will help her sleep.

## 2011-11-16 NOTE — Assessment & Plan Note (Signed)
BP was elevated 2 days ago, currently BP going down. Most likely reason for the BP to be elevated was increasing sodium intake. Plan: Continue current medications, as far as coreg 12.5 mg  she has been taking half tablet twice a day, recommend to take one tablet twice a day as originally prescribed.

## 2011-11-21 ENCOUNTER — Encounter: Payer: Self-pay | Admitting: Internal Medicine

## 2011-11-22 ENCOUNTER — Ambulatory Visit: Payer: Medicare Other | Admitting: Internal Medicine

## 2011-11-22 ENCOUNTER — Telehealth: Payer: Self-pay | Admitting: Internal Medicine

## 2011-11-22 NOTE — Telephone Encounter (Signed)
LMOVM for pt to return call 

## 2011-11-22 NOTE — Telephone Encounter (Signed)
Patient call with her BP readings (see below) , some of them are on the low side. Plan: Go back to previous coreg dose: 12.5 mg half tablet twice a day As far as the "confusion", if she has mental status changes she needs to be seen immediately.   JP    Subject: Non-Urgent Medical Question   Blood pressure readings for Alison Graves, some are lower than the range indicated for her. She says she feels tired and some times confused:  Nov 16, 2011 109/68 - Pulse: 75, 8:00 am  Nov 18, 2011 130/78 - Pulse: 64 108/67 - Pulse: 61 109/67 - Pulse: 61 110/64 - Pulse: 60  Nov 19, 2011 106/71 - Pulse: 64 121/72 - Pulse: 65 112/72 - Pulse: 64 109/68 - Pulse: 63 119/72 - Pulse: 64

## 2011-11-23 NOTE — Telephone Encounter (Signed)
Sent mychart msg.

## 2011-11-29 ENCOUNTER — Encounter: Payer: Self-pay | Admitting: Internal Medicine

## 2011-11-29 ENCOUNTER — Ambulatory Visit (INDEPENDENT_AMBULATORY_CARE_PROVIDER_SITE_OTHER): Payer: Medicare Other | Admitting: Internal Medicine

## 2011-11-29 VITALS — BP 138/86 | HR 66 | Temp 98.4°F | Wt 143.0 lb

## 2011-11-29 DIAGNOSIS — M25559 Pain in unspecified hip: Secondary | ICD-10-CM

## 2011-11-29 DIAGNOSIS — M25552 Pain in left hip: Secondary | ICD-10-CM

## 2011-11-29 MED ORDER — TRAMADOL HCL 50 MG PO TABS: ORAL_TABLET | ORAL | Status: DC

## 2011-11-29 NOTE — Patient Instructions (Signed)
Use an anti-inflammatory cream such as Aspercreme or Zostrix cream twice a day to the left hip as needed. In lieu of this warm moist compresses or  hot water bottle can be used. Do not apply ice . Physical Therapy if no better

## 2011-11-29 NOTE — Progress Notes (Signed)
  Subjective:    Patient ID: Alison Graves, female    DOB: April 26, 1932, 78 y.o.   MRN: [id number]  HPI Extremity pain Location:L hip & leg Onset: 11/23/11 Trigger/injury:tripped & fell that day Pain quality:shooting  Pain severity:up to 10 Duration:constant Radiation:from hip to ankle Exacerbating factors:climbing stairs or reaching with the L hand Treatment/response:Tylenol 650 did not help the pain      Review of Systems Constitutional: no fever, chills, sweats, change in weight  Musculoskeletal:no  muscle cramps or pain; no  joint stiffness, redness, or swelling Skin:no rash, color change Neuro: no weakness; incontinence (stool/urine); numbness and tingling Heme:no lymphadenopathy; abnormal bruising or bleeding         Objective:   Physical Exam Gen.: Healthy and well-nourished in appearance.Appears younger than stated age                                                                                    Musculoskeletal/extremities: No deformity or scoliosis noted of  the thoracic or lumbar spine. No clubbing, cyanosis, edema, or deformity noted. Range of motion  normal .Tone & strength  normal.Joints normal. Nail health  Good.Negative SLR bilaterally to 90 degrees. She limps on L with gait . No foot drop. No pain  with   ROM or percussion @ L hip Vascular: dorsalis pedis and  posterior tibial pulses are full and equal. No bruits present. Neurologic: Alert and oriented x3. Deep tendon reflexes symmetrical and normal.          Skin: Intact without suspicious lesions or rashes. Psych: Mood and affect are normal. Normally interactive                                                                                         Assessment & Plan:    #1 hip pain; radiculopathy not suspect Plan: See orders and recommendations

## 2011-12-19 ENCOUNTER — Ambulatory Visit (INDEPENDENT_AMBULATORY_CARE_PROVIDER_SITE_OTHER): Payer: Medicare Other | Admitting: Internal Medicine

## 2011-12-19 VITALS — BP 134/86 | HR 80 | Temp 98.2°F | Wt 140.0 lb

## 2011-12-19 DIAGNOSIS — I10 Essential (primary) hypertension: Secondary | ICD-10-CM

## 2011-12-19 DIAGNOSIS — M1990 Unspecified osteoarthritis, unspecified site: Secondary | ICD-10-CM | POA: Diagnosis not present

## 2011-12-19 DIAGNOSIS — F411 Generalized anxiety disorder: Secondary | ICD-10-CM | POA: Diagnosis not present

## 2011-12-19 DIAGNOSIS — E785 Hyperlipidemia, unspecified: Secondary | ICD-10-CM | POA: Diagnosis not present

## 2011-12-19 DIAGNOSIS — F419 Anxiety disorder, unspecified: Secondary | ICD-10-CM

## 2011-12-19 LAB — LIPID PANEL
Total CHOL/HDL Ratio: 4
Triglycerides: 138 mg/dL (ref 0.0–149.0)

## 2011-12-19 LAB — LDL CHOLESTEROL, DIRECT: Direct LDL: 148.3 mg/dL

## 2011-12-19 NOTE — Progress Notes (Signed)
  Subjective:    Patient ID: Alison Graves, female    DOB: 26-Sep-1931, 78 y.o.   MRN: [id number]  HPI Followup from office visit last month. Hypertension, good medication compliance, ambulatory BPs 120/80, occasionally systolic blood pressure goes as high as 143. Anxiety, started Lexapro, overall she feels "a little better" denies any side effects.  Past Medical History  Diagnosis Date  . Hyperlipemia   . Hemorrhoids   . PMR (polymyalgia rheumatica)     ?  Marland Kitchen Osteoarthritis   . Microhematuria     s/p eval DR Patsi Sears 2004  . HTN (hypertension)    Past Medical History  Diagnosis Date  . Hyperlipemia   . Hemorrhoids   . PMR (polymyalgia rheumatica)     ?  Marland Kitchen Osteoarthritis   . Microhematuria     s/p eval DR Patsi Sears 2004  . HTN (hypertension)      Review of Systems No chest pain or shortness or breath No lower extremity edema Since her last office visit, he saw Dr. Alwyn Ren with hip pain, taking Ultram, no apparent side effects, pain improving      Objective:   Physical Exam  General -- alert, well-developed, and well-nourished.  Lungs -- normal respiratory effort, no intercostal retractions, no accessory muscle use, and normal breath sounds.  Heart-- normal rate, regular rhythm, no murmur, and no gallop.  Extremities-- no pretibial edema bilaterally  Psych-- Cognition and judgment appear intact. Alert and cooperative with normal attention span and concentration. not anxious appearing and not depressed appearing.      Assessment & Plan:

## 2011-12-19 NOTE — Assessment & Plan Note (Signed)
Well-controlled, no change 

## 2011-12-19 NOTE — Assessment & Plan Note (Signed)
Good compliance with over-the-counter fish oil. Labs

## 2011-12-19 NOTE — Patient Instructions (Signed)
Check the  blood pressure 2 or 3 times a week, be sure it is between 110/60 and 140/85. If it is consistently higher or lower, let me know  

## 2011-12-19 NOTE — Assessment & Plan Note (Signed)
Was recently seen by Dr. Alwyn Ren, had left hip pain, now better on Ultram when necessary

## 2011-12-19 NOTE — Assessment & Plan Note (Signed)
We started low dose Lexapro for anxiety, no side effects, states that she is feeling a little better. No change

## 2011-12-20 ENCOUNTER — Encounter: Payer: Self-pay | Admitting: Internal Medicine

## 2011-12-21 ENCOUNTER — Encounter: Payer: Self-pay | Admitting: *Deleted

## 2012-02-04 ENCOUNTER — Other Ambulatory Visit: Payer: Self-pay | Admitting: Internal Medicine

## 2012-02-05 NOTE — Telephone Encounter (Signed)
Refill done.  

## 2012-04-02 ENCOUNTER — Other Ambulatory Visit: Payer: Self-pay | Admitting: Internal Medicine

## 2012-04-03 NOTE — Telephone Encounter (Signed)
Refill done.  

## 2012-04-25 ENCOUNTER — Other Ambulatory Visit: Payer: Self-pay | Admitting: Internal Medicine

## 2012-04-25 DIAGNOSIS — Z1231 Encounter for screening mammogram for malignant neoplasm of breast: Secondary | ICD-10-CM

## 2012-04-26 ENCOUNTER — Ambulatory Visit
Admission: RE | Admit: 2012-04-26 | Discharge: 2012-04-26 | Disposition: A | Discharge status: Home / Self Care | Payer: Medicare Other | Source: Ambulatory Visit | Attending: Internal Medicine | Admitting: Internal Medicine

## 2012-04-26 DIAGNOSIS — Z1231 Encounter for screening mammogram for malignant neoplasm of breast: Secondary | ICD-10-CM

## 2012-04-28 ENCOUNTER — Emergency Department (INDEPENDENT_AMBULATORY_CARE_PROVIDER_SITE_OTHER)
Admission: EM | Admit: 2012-04-28 | Discharge: 2012-04-28 | Disposition: A | Discharge status: Home / Self Care | Payer: Medicare Other | Source: Home / Self Care | Attending: Emergency Medicine | Admitting: Emergency Medicine

## 2012-04-28 ENCOUNTER — Emergency Department (INDEPENDENT_AMBULATORY_CARE_PROVIDER_SITE_OTHER): Payer: Medicare Other

## 2012-04-28 DIAGNOSIS — M25552 Pain in left hip: Secondary | ICD-10-CM

## 2012-04-28 DIAGNOSIS — M542 Cervicalgia: Secondary | ICD-10-CM | POA: Diagnosis not present

## 2012-04-28 DIAGNOSIS — M47812 Spondylosis without myelopathy or radiculopathy, cervical region: Secondary | ICD-10-CM | POA: Diagnosis not present

## 2012-04-28 DIAGNOSIS — M436 Torticollis: Secondary | ICD-10-CM

## 2012-04-28 DIAGNOSIS — M5030 Other cervical disc degeneration, unspecified cervical region: Secondary | ICD-10-CM | POA: Diagnosis not present

## 2012-04-28 MED ORDER — DIAZEPAM 5 MG/ML IJ SOLN
INTRAMUSCULAR | Status: AC
  Filled 2012-04-28: qty 2

## 2012-04-28 MED ORDER — DIAZEPAM 5 MG/ML IJ SOLN
2.0000 mg | Freq: Once | INTRAMUSCULAR | Status: AC
  Administered 2012-04-28: 2 mg via INTRAMUSCULAR

## 2012-04-28 MED ORDER — DIAZEPAM 2 MG PO TABS: 2.0000 mg | ORAL_TABLET | Freq: Two times a day (BID) | ORAL | Status: DC | PRN

## 2012-04-28 MED ORDER — KETOROLAC TROMETHAMINE 30 MG/ML IJ SOLN
INTRAMUSCULAR | Status: AC
  Filled 2012-04-28: qty 1

## 2012-04-28 MED ORDER — KETOROLAC TROMETHAMINE 30 MG/ML IJ SOLN
30.0000 mg | Freq: Once | INTRAMUSCULAR | Status: AC
  Administered 2012-04-28: 30 mg via INTRAMUSCULAR

## 2012-04-28 MED ORDER — TRAMADOL HCL 50 MG PO TABS: 50.0000 mg | ORAL_TABLET | Freq: Four times a day (QID) | ORAL | Status: DC | PRN

## 2012-04-28 MED ORDER — MELOXICAM 7.5 MG PO TABS: 7.5000 mg | ORAL_TABLET | Freq: Every day | ORAL | Status: DC

## 2012-04-28 NOTE — ED Notes (Signed)
Neck pain x 3 days, swollen glands

## 2012-04-28 NOTE — ED Provider Notes (Signed)
History     CSN: [id number]  Arrival date & time 04/28/12  1557   First MD Initiated Contact with Patient 04/28/12 1731      Chief Complaint  Patient presents with  . Torticollis    (Consider location/radiation/quality/duration/timing/severity/associated sxs/prior treatment) Patient is a 78 y.o. female presenting with neck injury. The history is provided by the patient and a relative.  Neck Injury This is a new problem. The current episode started more than 2 days ago (3 days ago, left side pain). The problem occurs daily. The problem has been gradually worsening. The symptoms are aggravated by twisting (palpation). The symptoms are relieved by rest. She has tried nothing for the symptoms.  no known injury, reports awoke 3 mornings ago with left neck pain, pain radiates into upper back.  NO medications taken for pain.  Past Medical History  Diagnosis Date  . Hyperlipemia   . Hemorrhoids   . PMR (polymyalgia rheumatica)     ?  Marland Kitchen Osteoarthritis   . Microhematuria     s/p eval DR Patsi Sears 2004  . HTN (hypertension)     Past Surgical History  Procedure Date  . Laparoscopic hysterectomy     per Dr Lily Peer, ?oophorectomy  . Cataract extraction   . Breast biopsy 2005    benign    Family History  Problem Relation Age of Onset  . Diabetes      grandmother  . Coronary artery disease Mother   . Stroke Neg Hx   . Hypertension Neg Hx   . Cancer Neg Hx     History  Substance Use Topics  . Smoking status: Never Smoker   . Smokeless tobacco: Not on file  . Alcohol Use: No    OB History    Grav Para Term Preterm Abortions TAB SAB Ect Mult Living                  Review of Systems  Constitutional: Negative.   HENT: Positive for neck pain and neck stiffness. Negative for ear pain, facial swelling and sinus pressure.   Respiratory: Negative.   Cardiovascular: Negative.   Musculoskeletal: Positive for back pain.    Allergies  Review of patient's allergies  indicates no known allergies.  Home Medications   Current Outpatient Rx  Name Route Sig Dispense Refill  . ASPIRIN 81 MG PO TABS Oral Take 81 mg by mouth daily.      Marland Kitchen CALCIUM 600 + D PO Oral Take by mouth.      Marland Kitchen CARVEDILOL 12.5 MG PO TABS Oral Take 6.25 mg by mouth 2 (two) times daily with a meal.    . OMEGA-3 FATTY ACIDS 1000 MG PO CAPS Oral Take 4 g by mouth daily.     . FOSINOPRIL SODIUM 40 MG PO TABS  TAKE TWO TABLETS BY MOUTH EVERY DAY 60 tablet 5  . HYDROCHLOROTHIAZIDE 25 MG PO TABS  TAKE ONE TABLET BY MOUTH EVERY DAY 30 tablet 3  . MULTIVITAMIN PO Oral Take by mouth.      . ACETAMINOPHEN ER 650 MG PO TBCR Oral Take 650 mg by mouth every 8 (eight) hours as needed.      Marland Kitchen CARVEDILOL 12.5 MG PO TABS Oral Take 1 tablet (12.5 mg total) by mouth 2 (two) times daily. 60 tablet 11  . DIAZEPAM 2 MG PO TABS Oral Take 1 tablet (2 mg total) by mouth every 12 (twelve) hours as needed (for muscle spasms). 10 tablet 0  . ESCITALOPRAM  OXALATE 10 MG PO TABS Oral Take 0.5 tablets (5 mg total) by mouth daily. 30 tablet 1  . MELOXICAM 7.5 MG PO TABS Oral Take 1 tablet (7.5 mg total) by mouth daily. 30 tablet 1  . TRAMADOL HCL 50 MG PO TABS Oral Take 1 tablet (50 mg total) by mouth every 6 (six) hours as needed for pain. 30 tablet 1    BP 130/90  Pulse 79  Temp 98.5 F (36.9 C) (Oral)  Resp 18  SpO2 94%  Physical Exam  Nursing note and vitals reviewed. Constitutional: She is oriented to person, place, and time. Vital signs are normal. She appears well-developed and well-nourished. She is active and cooperative.  HENT:  Head: Normocephalic.  Right Ear: External ear normal.  Left Ear: External ear normal.  Nose: Nose normal.  Mouth/Throat: Oropharynx is clear and moist. No oropharyngeal exudate.  Eyes: Conjunctivae normal and EOM are normal. Pupils are equal, round, and reactive to light. No scleral icterus.  Neck: Trachea normal. Neck supple. No JVD present. Muscular tenderness present. No  spinous process tenderness present. No rigidity. Decreased range of motion present. No tracheal deviation, no edema and no erythema present. No Brudzinski's sign noted. No thyromegaly present.         Paracervical muscular tenderness, spasms noted on left  Cardiovascular: Normal rate, regular rhythm, normal heart sounds, intact distal pulses and normal pulses.   Pulmonary/Chest: Effort normal and breath sounds normal.  Lymphadenopathy:       Head (right side): No submental, no submandibular, no tonsillar, no preauricular, no posterior auricular and no occipital adenopathy present.       Head (left side): No submental, no submandibular, no tonsillar, no preauricular, no posterior auricular and no occipital adenopathy present.    She has no cervical adenopathy.  Neurological: She is alert and oriented to person, place, and time. No cranial nerve deficit or sensory deficit.  Skin: Skin is warm and dry.  Psychiatric: She has a normal mood and affect. Her speech is normal and behavior is normal. Judgment and thought content normal. Cognition and memory are normal.    ED Course  Procedures (including critical care time)  Labs Reviewed - No data to display Dg Cervical Spine Complete  04/28/2012  *RADIOLOGY REPORT*  Clinical Data: 78 year old female with neck pain.  No known injury.  CERVICAL SPINE - COMPLETE 4+ VIEW  Comparison: None  Findings: There is no evidence of fracture, subluxation or prevertebral soft tissue swelling. Mild degenerative disc disease at C5-C6 and C6-C7 noted. Extensive facet arthropathy is identified with mild bony foraminal narrowing at C4-C5. No definite focal bony lesions are noted.  IMPRESSION: No evidence of acute abnormality.  Extensive facet arthropathy within the cervical spine with mild bony foraminal narrowing at C4-C5.  Mild degenerative disc disease at C5-C6 and C6-7.   Original Report Authenticated By: Rosendo Gros, M.D.      1. Torticollis   2. Neck pain     3. Hip pain, acute, left       MDM  Toradol 30mg  IM and Valium 2mg  IM administered in office.        Johnsie Kindred, NP 04/28/12 1847

## 2012-04-29 NOTE — ED Provider Notes (Signed)
Medical screening examination/treatment/procedure(s) were performed by non-physician practitioner and as supervising physician I was immediately available for consultation/collaboration.  Ala Capri, M.D.   Davita Sublett C Naliyah Neth, MD 04/29/12 0756 

## 2012-05-03 ENCOUNTER — Ambulatory Visit: Payer: Medicare Other | Admitting: Internal Medicine

## 2012-05-03 ENCOUNTER — Ambulatory Visit (INDEPENDENT_AMBULATORY_CARE_PROVIDER_SITE_OTHER): Payer: Medicare Other | Admitting: Internal Medicine

## 2012-05-03 ENCOUNTER — Encounter: Payer: Self-pay | Admitting: Internal Medicine

## 2012-05-03 ENCOUNTER — Ambulatory Visit (HOSPITAL_BASED_OUTPATIENT_CLINIC_OR_DEPARTMENT_OTHER)
Admission: RE | Admit: 2012-05-03 | Discharge: 2012-05-03 | Disposition: A | Discharge status: Home / Self Care | Payer: Medicare Other | Source: Ambulatory Visit | Attending: Internal Medicine | Admitting: Internal Medicine

## 2012-05-03 VITALS — BP 138/78 | HR 69 | Temp 98.0°F | Wt 147.0 lb

## 2012-05-03 DIAGNOSIS — M1990 Unspecified osteoarthritis, unspecified site: Secondary | ICD-10-CM | POA: Diagnosis not present

## 2012-05-03 DIAGNOSIS — M353 Polymyalgia rheumatica: Secondary | ICD-10-CM | POA: Diagnosis not present

## 2012-05-03 DIAGNOSIS — M549 Dorsalgia, unspecified: Secondary | ICD-10-CM | POA: Diagnosis not present

## 2012-05-03 DIAGNOSIS — M47814 Spondylosis without myelopathy or radiculopathy, thoracic region: Secondary | ICD-10-CM | POA: Diagnosis not present

## 2012-05-03 MED ORDER — PREDNISONE 10 MG PO TABS: ORAL_TABLET | ORAL | Status: DC

## 2012-05-03 MED ORDER — DIAZEPAM 2 MG PO TABS: 2.0000 mg | ORAL_TABLET | Freq: Every evening | ORAL | Status: DC | PRN

## 2012-05-03 NOTE — Progress Notes (Signed)
  Subjective:    Patient ID: Alison Graves, female    DOB: [birth year], 78 y.o.   MRN: [id number]  HPI Acute visit. We discussed the following issues,  Torticollis: Symptoms started a few days ago, she described pain in the neck and "whole thorax", she points mostly to the neck, upper thoracic spine and low anterior chest. Denies substernal chest pain. Pain was severe, went to the ER 04/29/2012. Was prescribed, ultram,  diazepam and meloxicam. X-rays showed DJD Medication is helping, has caused some somnolence.  Cough, going on for a month, reports that is finally improving.  Past Medical History  Diagnosis Date  . Hyperlipemia   . Hemorrhoids   . PMR (polymyalgia rheumatica)     ?  Marland Kitchen Osteoarthritis   . Microhematuria     s/p eval DR Patsi Sears 2004  . HTN (hypertension)    Past Surgical History  Procedure Date  . Laparoscopic hysterectomy     per Dr Lily Peer, ?oophorectomy  . Cataract extraction   . Breast biopsy 2005    benign    Review of Systems Denies recent fever or chills Denies any tingling in the upper or lower extremities. Denies any injury to the neck She also had hip pain when she went to the emergency room, that is resolved. No nausea, vomiting, diarrhea.     Objective:   Physical Exam General -- alert, well-developed, and overweight appearing. No apparent distress.  Neck --no tender to palpation in the cervical spine, no deformities. Range of motion limited throughout, no lymphadenopathies. Lungs -- normal respiratory effort, no intercostal retractions, no accessory muscle use, and normal breath sounds.   Heart-- normal rate, regular rhythm, no murmur, and no gallop.   Neurologic-- alert & oriented X3 , DTRs and strength normal in all extremities. Psych-- Cognition and judgment appear intact. Alert and cooperative with normal attention span and concentration.  not anxious appearing and not depressed appearing.       Assessment & Plan:    Cough--resolving

## 2012-05-03 NOTE — Patient Instructions (Addendum)
Get a chest XR at [facility], corner of [address] (10 minutes form here); they are open 24/7 [address]  [place], [place] [address] [phone] ---- Stop meloxicam Take prednisone as prescribed Tylenol  500 mg OTC 2 tabs a day every 8 hours as needed for pain Ultram only as needed if pain moderate to severe Diazepam only at bedtime if pain severe ---  come back in a month for a yearly checkup, fasting

## 2012-05-03 NOTE — Assessment & Plan Note (Signed)
Several days history of neck pain and "whole thoracic pain" . No sx c/w radiculopathy,  neck range of motion is decreased. She is improving with, tramadol, diazepam and meloxicam; admits she gets slightly sleepy. Although reports thoracic pain, most of the pain is in the neck and upper thoracic spine. She has a history of PMR, denies recent fevers or weight loss. Plan: Discontinue meloxicam, try Tylenol Prednisone Decrease Ativan to qhs  Tspine XR, r/o Fx

## 2012-05-06 ENCOUNTER — Other Ambulatory Visit: Payer: Self-pay | Admitting: Internal Medicine

## 2012-05-06 ENCOUNTER — Encounter: Payer: Self-pay | Admitting: Internal Medicine

## 2012-05-08 NOTE — Telephone Encounter (Signed)
Refill done.  

## 2012-07-12 ENCOUNTER — Ambulatory Visit (INDEPENDENT_AMBULATORY_CARE_PROVIDER_SITE_OTHER): Payer: Medicare Other | Admitting: Internal Medicine

## 2012-07-12 VITALS — BP 148/88 | HR 80 | Temp 98.4°F | Wt 148.0 lb

## 2012-07-12 DIAGNOSIS — J40 Bronchitis, not specified as acute or chronic: Secondary | ICD-10-CM

## 2012-07-12 DIAGNOSIS — I10 Essential (primary) hypertension: Secondary | ICD-10-CM | POA: Diagnosis not present

## 2012-07-12 DIAGNOSIS — F411 Generalized anxiety disorder: Secondary | ICD-10-CM | POA: Diagnosis not present

## 2012-07-12 DIAGNOSIS — F419 Anxiety disorder, unspecified: Secondary | ICD-10-CM

## 2012-07-12 MED ORDER — ALBUTEROL SULFATE HFA 108 (90 BASE) MCG/ACT IN AERS: 2.0000 | INHALATION_SPRAY | Freq: Four times a day (QID) | RESPIRATORY_TRACT | Status: DC | PRN

## 2012-07-12 MED ORDER — AZITHROMYCIN 250 MG PO TABS: ORAL_TABLET | ORAL | Status: DC

## 2012-07-12 NOTE — Progress Notes (Signed)
  Subjective:    Patient ID: Alison Graves, female    DOB: 12/23/1931, 79 y.o.   MRN: [id number]  HPI Acute visit Symptoms started 2 weeks ago: Runny nose, sore throat, cough. Initially she cough up white sputum, now is greenish in color.  Past Medical History  Diagnosis Date  . Hyperlipemia   . Hemorrhoids   . PMR (polymyalgia rheumatica)     ?  Marland Kitchen Osteoarthritis   . Microhematuria     s/p eval DR Patsi Sears 2004  . HTN (hypertension)    Past Surgical History  Procedure Date  . Laparoscopic hysterectomy     per Dr Lily Peer, ?oophorectomy  . Cataract extraction   . Breast biopsy 2005    benign   History   Social History  . Marital Status: Widowed    Spouse Name: N/A    Number of Children: 3  . Years of Education: N/A   Occupational History  . retired     Social History Main Topics  . Smoking status: Never Smoker   . Smokeless tobacco: Never Used  . Alcohol Use: No  . Drug Use: No  . Sexually Active: Not on file   Other Topics Concern  . Not on file   Social History Narrative   Widow,  Original from [place] , lives w/ her 3 daughters    Review of Systems No fever or chills No nausea vomiting. Very mild myalgias. When asked admits to some wheezing, mild shortness of breath and no chest pain.    Objective:   Physical Exam General -- alert, well-developed, NAD.    HEENT -- TMs normal, throat w/o redness, face symmetric and not tender to palpation, nose is slightly congested  Lungs -- normal respiratory effort, no intercostal retractions, no accessory muscle use, and few rhonchi with cough and end expiratory wheezing Heart-- normal rate, regular rhythm, no murmur, and no gallop.   neurologic-- alert & oriented X3 and strength normal in all extremities.    Assessment & Plan:

## 2012-07-12 NOTE — Assessment & Plan Note (Signed)
BP today 148/88, slightly > than goal, needs a routine office visit. Patient aware.

## 2012-07-12 NOTE — Patient Instructions (Addendum)
Rest, fluids , tylenol For cough, take Mucinex DM or Coricidin as needed If wheezing, use albuterol as needed, 4 times a day Take the antibiotic as prescribed  (zithromax) Call if no better in few days Call anytime if the symptoms are severe  ---- You are due for a routine checkup, please schedule at your convenience

## 2012-07-12 NOTE — Assessment & Plan Note (Signed)
Presents with bronchitis and mild bronchospasm. No history of asthma but has been prescribed albuterol years ago. Plan: see  instructions

## 2012-07-12 NOTE — Assessment & Plan Note (Signed)
Not taking Lexapro, feeling well

## 2012-07-13 ENCOUNTER — Encounter: Payer: Self-pay | Admitting: Internal Medicine

## 2012-07-14 ENCOUNTER — Encounter: Payer: Self-pay | Admitting: Internal Medicine

## 2012-08-05 ENCOUNTER — Other Ambulatory Visit: Payer: Self-pay | Admitting: Internal Medicine

## 2012-08-05 NOTE — Telephone Encounter (Signed)
Refill done.  

## 2012-08-09 ENCOUNTER — Encounter: Payer: Self-pay | Admitting: Internal Medicine

## 2012-08-09 ENCOUNTER — Ambulatory Visit (INDEPENDENT_AMBULATORY_CARE_PROVIDER_SITE_OTHER): Payer: Medicare Other | Admitting: Internal Medicine

## 2012-08-09 VITALS — BP 146/86 | HR 76 | Temp 98.1°F | Ht <= 58 in | Wt 149.0 lb

## 2012-08-09 DIAGNOSIS — M1990 Unspecified osteoarthritis, unspecified site: Secondary | ICD-10-CM

## 2012-08-09 DIAGNOSIS — Z0000 Encounter for general adult medical examination without abnormal findings: Secondary | ICD-10-CM | POA: Diagnosis not present

## 2012-08-09 DIAGNOSIS — M899 Disorder of bone, unspecified: Secondary | ICD-10-CM | POA: Diagnosis not present

## 2012-08-09 DIAGNOSIS — Z23 Encounter for immunization: Secondary | ICD-10-CM | POA: Diagnosis not present

## 2012-08-09 DIAGNOSIS — I10 Essential (primary) hypertension: Secondary | ICD-10-CM | POA: Diagnosis not present

## 2012-08-09 DIAGNOSIS — E785 Hyperlipidemia, unspecified: Secondary | ICD-10-CM

## 2012-08-09 DIAGNOSIS — M949 Disorder of cartilage, unspecified: Secondary | ICD-10-CM | POA: Diagnosis not present

## 2012-08-09 DIAGNOSIS — M8580 Other specified disorders of bone density and structure, unspecified site: Secondary | ICD-10-CM

## 2012-08-09 LAB — BASIC METABOLIC PANEL WITH GFR
BUN: 25 mg/dL — ABNORMAL HIGH (ref 6–23)
CO2: 28 meq/L (ref 19–32)
Calcium: 10.2 mg/dL (ref 8.4–10.5)
Chloride: 101 meq/L (ref 96–112)
Creat: 0.72 mg/dL (ref 0.50–1.10)
Glucose, Bld: 103 mg/dL — ABNORMAL HIGH (ref 70–99)
Potassium: 3.9 meq/L (ref 3.5–5.3)
Sodium: 139 meq/L (ref 135–145)

## 2012-08-09 LAB — CBC WITH DIFFERENTIAL/PLATELET
Basophils Absolute: 0.1 10*3/uL (ref 0.0–0.1)
Basophils Relative: 1 % (ref 0–1)
Lymphocytes Relative: 38 % (ref 12–46)
MCHC: 34.3 g/dL (ref 30.0–36.0)
Neutro Abs: 3.5 10*3/uL (ref 1.7–7.7)
Neutrophils Relative %: 49 % (ref 43–77)
Platelets: 254 10*3/uL (ref 150–400)
RDW: 13.9 % (ref 11.5–15.5)
WBC: 7 10*3/uL (ref 4.0–10.5)

## 2012-08-09 LAB — TSH: TSH: 3.17 u[IU]/mL (ref 0.350–4.500)

## 2012-08-09 NOTE — Assessment & Plan Note (Addendum)
Ongoing pain, mostly in the knees, in the past we have attempted to refer her to orthopedic surgery several times but she never followed through. Currently on Tylenol, declines stronger medication.

## 2012-08-09 NOTE — Assessment & Plan Note (Signed)
Seems to be well-controlled, labs 

## 2012-08-09 NOTE — Assessment & Plan Note (Addendum)
Td-- 2-11 pneumonia shot 2010 shingles shot 3-10  Gyn, no recent visit w/ Dr Lily Peer ; s/p hysterectomy, never had an abnormal PAP MMG 04-2012 neg , most medical societies recommend no screening after age 77, she has no family history. Will discontinue mammograms , discussed with patient, she agrees .  Cscope  [phone] Dr [address], Bx no adenomatous changes   Labs

## 2012-08-09 NOTE — Assessment & Plan Note (Signed)
DEXA 10-11, osteopenia Tscore-1.1, was rec Ca, Vit D Plan: Repeat bone density test, unfortunately the patient is unable to exercise much

## 2012-08-09 NOTE — Patient Instructions (Signed)
Please see the optometrist to check your vision at your earliest convenience.  Fall Prevention and Home Safety Falls cause injuries and can affect all age groups. It is possible to use preventive measures to significantly decrease the likelihood of falls. There are many simple measures which can make your home safer and prevent falls. OUTDOORS  Repair cracks and edges of walkways and driveways.  Remove high doorway thresholds.  Trim shrubbery on the main path into your home.  Have good outside lighting.  Clear walkways of tools, rocks, debris, and clutter.  Check that handrails are not broken and are securely fastened. Both sides of steps should have handrails.  Have leaves, snow, and ice cleared regularly.  Use sand or salt on walkways during winter months.  In the garage, clean up grease or oil spills. BATHROOM  Install night lights.  Install grab bars by the toilet and in the tub and shower.  Use non-skid mats or decals in the tub or shower.  Place a plastic non-slip stool in the shower to sit on, if needed.  Keep floors dry and clean up all water on the floor immediately.  Remove soap buildup in the tub or shower on a regular basis.  Secure bath mats with non-slip, double-sided rug tape.  Remove throw rugs and tripping hazards from the floors. BEDROOMS  Install night lights.  Make sure a bedside light is easy to reach.  Do not use oversized bedding.  Keep a telephone by your bedside.  Have a firm chair with side arms to use for getting dressed.  Remove throw rugs and tripping hazards from the floor. KITCHEN  Keep handles on pots and pans turned toward the center of the stove. Use back burners when possible.  Clean up spills quickly and allow time for drying.  Avoid walking on wet floors.  Avoid hot utensils and knives.  Position shelves so they are not too high or low.  Place commonly used objects within easy reach.  If necessary, use a sturdy  step stool with a grab bar when reaching.  Keep electrical cables out of the way.  Do not use floor polish or wax that makes floors slippery. If you must use wax, use non-skid floor wax.  Remove throw rugs and tripping hazards from the floor. STAIRWAYS  Never leave objects on stairs.  Place handrails on both sides of stairways and use them. Fix any loose handrails. Make sure handrails on both sides of the stairways are as long as the stairs.  Check carpeting to make sure it is firmly attached along stairs. Make repairs to worn or loose carpet promptly.  Avoid placing throw rugs at the top or bottom of stairways, or properly secure the rug with carpet tape to prevent slippage. Get rid of throw rugs, if possible.  Have an electrician put in a light switch at the top and bottom of the stairs. OTHER FALL PREVENTION TIPS  Wear low-heel or rubber-soled shoes that are supportive and fit well. Wear closed toe shoes.  When using a stepladder, make sure it is fully opened and both spreaders are firmly locked. Do not climb a closed stepladder.  Add color or contrast paint or tape to grab bars and handrails in your home. Place contrasting color strips on first and last steps.  Learn and use mobility aids as needed. Install an electrical emergency response system.  Turn on lights to avoid dark areas. Replace light bulbs that burn out immediately. Get light switches that glow.  Arrange furniture to create clear pathways. Keep furniture in the same place.  Firmly attach carpet with non-skid or double-sided tape.  Eliminate uneven floor surfaces.  Select a carpet pattern that does not visually hide the edge of steps.  Be aware of all pets. OTHER HOME SAFETY TIPS  Set the water temperature for 120 F (48.8 C).  Keep emergency numbers on or near the telephone.  Keep smoke detectors on every level of the home and near sleeping areas. Document Released: 06/09/2002 Document Revised:  12/19/2011 Document Reviewed: 09/08/2011 [facility] Patient Information 2013 [place], [place].

## 2012-08-09 NOTE — Progress Notes (Signed)
  Subjective:    Patient ID: Alison Graves, female    DOB: Oct 22, 1931, 79 y.o.   MRN: [id number]  HPI Here for Medicare AWV: 1. Risk factors based on Past M, S, F history: reviewed 2. Physical Activities: sedentary, unable to walk d/t knee pain  3. Depression/mood:  (-) screening  4. Hearing: No problemss noted or reported , no tinnitus 5. ADL's: independent, lives w/ her daughters, does not drive   6. Fall Risk: ne recent falls, see A/p 7. home Safety: does feel safe at home  8. Height, weight, &visual acuity: see VS, vision corrected. Due for a check up , encouraged to see an optometris 9. Counseling: provided 10. Labs ordered based on risk factors: if needed  11. Referral Coordination: if needed 12.  Care Plan, see assessment and plan  13.   Cognitive Assessment: motor skills limited by DJD, cognition appropriate   In addition, today we discussed the following: Hypertension, good medication compliance, ambulatory BPs less than 140/76. DJD-- most of the pain is in the knees, handling currently with Tylenol only.   Past Medical History  Diagnosis Date  . Hyperlipemia   . Hemorrhoids   . PMR (polymyalgia rheumatica)     ?  Marland Kitchen Osteoarthritis   . Microhematuria     s/p eval DR Patsi Sears 2004  . HTN (hypertension)    Past Surgical History  Procedure Laterality Date  . Laparoscopic hysterectomy      per Dr Lily Peer, ?oophorectomy  . Cataract extraction    . Breast biopsy  2005    benign   History   Social History  . Marital Status: Widowed    Spouse Name: N/A    Number of Children: 3  . Years of Education: N/A   Occupational History  . retired     Social History Main Topics  . Smoking status: Never Smoker   . Smokeless tobacco: Never Used  . Alcohol Use: No  . Drug Use: No  . Sexually Active: Not on file   Other Topics Concern  . Not on file   Social History Narrative   Widow,  Original from [place] , lives w/ her 3 daughters   Family History   Problem Relation Age of Onset  . Diabetes      grandmother  . Coronary artery disease Mother     early in life   . Stroke Neg Hx   . Hypertension Neg Hx   . Cancer Neg Hx     Review of Systems No chest pain or shortness of breath. No nausea, vomiting, diarrhea or blood in the stools. No dysuria, gross hematuria. No vaginal bleeding.     Objective:   Physical Exam General -- alert, well-developed, NAD.   Neck --no thyromegaly , normal carotid pulse Lungs -- normal respiratory effort, no intercostal retractions, no accessory muscle use, and normal breath sounds.   Heart-- normal rate, regular rhythm, no murmur, and no gallop.   Abdomen--soft, non-tender, no distention, no masses.   Extremities-- no pretibial edema bilaterally. Hand deformities consistent with DJD. No synovitis Neurologic-- alert & oriented X3  . Psych-- Cognition and judgment appear intact. Alert and cooperative with normal attention span and concentration.  not anxious appearing and not depressed appearing.       Assessment & Plan:

## 2012-08-09 NOTE — Assessment & Plan Note (Signed)
Taking in consideration of her age, BP, LDL, HDL her cardiovascular risk in 10 years is 6%. LDL goal less than 160. Recheck her cholesterol on return to the office

## 2012-08-10 ENCOUNTER — Encounter: Payer: Self-pay | Admitting: Internal Medicine

## 2012-08-17 ENCOUNTER — Other Ambulatory Visit: Payer: Self-pay

## 2012-09-02 ENCOUNTER — Other Ambulatory Visit: Payer: Self-pay | Admitting: Internal Medicine

## 2012-09-02 NOTE — Telephone Encounter (Signed)
Refill done.  

## 2012-09-23 ENCOUNTER — Encounter: Payer: Medicare Other | Admitting: Internal Medicine

## 2012-09-27 ENCOUNTER — Ambulatory Visit
Admission: RE | Admit: 2012-09-27 | Discharge: 2012-09-27 | Disposition: A | Discharge status: Home / Self Care | Payer: Medicare Other | Source: Ambulatory Visit | Attending: Internal Medicine | Admitting: Internal Medicine

## 2012-09-27 DIAGNOSIS — Z780 Asymptomatic menopausal state: Secondary | ICD-10-CM | POA: Diagnosis not present

## 2012-09-27 DIAGNOSIS — M8580 Other specified disorders of bone density and structure, unspecified site: Secondary | ICD-10-CM

## 2012-09-30 ENCOUNTER — Encounter: Payer: Self-pay | Admitting: *Deleted

## 2012-12-19 DIAGNOSIS — H1045 Other chronic allergic conjunctivitis: Secondary | ICD-10-CM | POA: Diagnosis not present

## 2012-12-19 DIAGNOSIS — Z961 Presence of intraocular lens: Secondary | ICD-10-CM | POA: Diagnosis not present

## 2013-03-03 ENCOUNTER — Other Ambulatory Visit: Payer: Self-pay | Admitting: Internal Medicine

## 2013-03-04 NOTE — Telephone Encounter (Signed)
Refill done.  

## 2013-04-04 ENCOUNTER — Other Ambulatory Visit: Payer: Self-pay | Admitting: Internal Medicine

## 2013-04-04 NOTE — Telephone Encounter (Signed)
Refills for Coreg, HCTZ and Fosinipril sent to pharmacy x 1 month month. Patient is due for OV and labs.

## 2013-04-04 NOTE — Telephone Encounter (Signed)
Medications refilled. Pt needs OV and labs

## 2013-04-14 DIAGNOSIS — Z23 Encounter for immunization: Secondary | ICD-10-CM | POA: Diagnosis not present

## 2013-05-01 ENCOUNTER — Telehealth: Payer: Self-pay | Admitting: Internal Medicine

## 2013-05-01 MED ORDER — FOSINOPRIL SODIUM 40 MG PO TABS: ORAL_TABLET | ORAL | Status: DC

## 2013-05-01 MED ORDER — HYDROCHLOROTHIAZIDE 25 MG PO TABS: ORAL_TABLET | ORAL | Status: DC

## 2013-05-01 MED ORDER — CARVEDILOL 12.5 MG PO TABS: ORAL_TABLET | ORAL | Status: DC

## 2013-05-01 NOTE — Telephone Encounter (Signed)
rx refilled for 1 month per protocol. DJR

## 2013-05-01 NOTE — Telephone Encounter (Signed)
Patient daughter called and wanted to see if dr Drue Novel would refill her mother rx's carvedilol (COREG) 12.5 MG tablet  fosinopril (MONOPRIL) 40 MG tablet  hydrochlorothiazide (HYDRODIURIL) 25 MG tablet. Patient is aware that she will need to be seen for dr Drue Novel can refill her medication but right now there is no apt available until next week. Patient is going to try and call in the morning to obtain a same day apt for tomorrow. thanks   Pharmacy [facility]

## 2013-05-02 ENCOUNTER — Ambulatory Visit (INDEPENDENT_AMBULATORY_CARE_PROVIDER_SITE_OTHER): Payer: Medicare Other | Admitting: Internal Medicine

## 2013-05-02 ENCOUNTER — Encounter: Payer: Self-pay | Admitting: Internal Medicine

## 2013-05-02 VITALS — BP 153/78 | HR 74 | Temp 98.3°F | Wt 147.0 lb

## 2013-05-02 DIAGNOSIS — M1990 Unspecified osteoarthritis, unspecified site: Secondary | ICD-10-CM | POA: Diagnosis not present

## 2013-05-02 DIAGNOSIS — I10 Essential (primary) hypertension: Secondary | ICD-10-CM

## 2013-05-02 LAB — BASIC METABOLIC PANEL
Calcium: 9.5 mg/dL (ref 8.4–10.5)
GFR: 77.93 mL/min (ref >60.00)
Glucose, Bld: 103 mg/dL — ABNORMAL HIGH (ref 70–99)
Sodium: 135 mEq/L (ref 135–145)

## 2013-05-02 MED ORDER — CARVEDILOL 12.5 MG PO TABS: ORAL_TABLET | ORAL | Status: DC

## 2013-05-02 MED ORDER — HYDROCHLOROTHIAZIDE 25 MG PO TABS: ORAL_TABLET | ORAL | Status: DC

## 2013-05-02 MED ORDER — FOSINOPRIL SODIUM 40 MG PO TABS: ORAL_TABLET | ORAL | Status: DC

## 2013-05-02 NOTE — Patient Instructions (Signed)
Get your blood work before you leave  Next visit in 6 months  for a physical exam . Fasting Please make an appointment    Increase carvedilol to 1.5 tablets twice a day  Check the  blood pressure 2   times a week be sure it is between 110/60 and 140/85. Ideal blood pressure is 120/80. If it is consistently higher or lower, let me know

## 2013-05-02 NOTE — Assessment & Plan Note (Signed)
Reminded to avoid NSAIDS 

## 2013-05-02 NOTE — Assessment & Plan Note (Addendum)
BP slightly elevated in the ambulatory setting, would like to see her BP ~ 120, 130 range. Increase carvedilol to 1.5 tablets twice a day. Watch salt intake. Check a BMP

## 2013-05-02 NOTE — Progress Notes (Signed)
  Subjective:    Patient ID: Alison Graves, female    DOB: Dec 14, 1931, 79 y.o.   MRN: [id number]  HPI Routine office visit Med list reviewed, good compliance. Ambulatory BP ranged from 126-165, usually in the high range. Diastolic blood pressure normal. From time to time she has a rushing feeling in the ears were her BP is elevated Taking Tylenol for pain (DJD) she does avoid NSAIDs  Past Medical History  Diagnosis Date  . Hyperlipemia   . Hemorrhoids   . PMR (polymyalgia rheumatica)     ?  Marland Kitchen Osteoarthritis   . Microhematuria     s/p eval DR Patsi Sears 2004  . HTN (hypertension)    Past Surgical History  Procedure Laterality Date  . Laparoscopic hysterectomy      per Dr Lily Peer, ?oophorectomy  . Cataract extraction    . Breast biopsy  2005    benign   History   Social History  . Marital Status: Widowed    Spouse Name: N/A    Number of Children: 3  . Years of Education: N/A   Occupational History  . retired     Social History Main Topics  . Smoking status: Never Smoker   . Smokeless tobacco: Never Used  . Alcohol Use: No  . Drug Use: No  . Sexual Activity: Not on file   Other Topics Concern  . Not on file   Social History Narrative   Widow,  Original from [place] , lives w/ her 3 daughters    Review of Systems  No  CP, SOB, lower extremity edema Denies  nausea, vomiting diarrhea    Objective:   Physical Exam /BP 153/78  Pulse 74  Temp(Src) 98.3 F (36.8 C)  Wt 147 lb (66.679 kg)  BMI 31.24 kg/m2  SpO2 92% General -- alert, well-developed, NAD.   Lungs -- normal respiratory effort, no intercostal retractions, no accessory muscle use, and normal breath sounds.  Heart-- normal rate, regular rhythm, no murmur.   Extremities-- no pretibial edema bilaterally  Neurologic--  alert & oriented X3. Speech normal, gait normal, strength normal in all extremities.  Psych-- Cognition and judgment appear intact. Cooperative with normal attention span and  concentration. No anxious appearing , no depressed appearing.      Assessment & Plan:

## 2013-05-03 ENCOUNTER — Encounter: Payer: Self-pay | Admitting: Internal Medicine

## 2013-05-08 ENCOUNTER — Other Ambulatory Visit: Payer: Self-pay

## 2013-06-05 ENCOUNTER — Encounter (HOSPITAL_COMMUNITY): Payer: Self-pay | Admitting: Emergency Medicine

## 2013-06-05 ENCOUNTER — Emergency Department (HOSPITAL_COMMUNITY)
Admission: EM | Admit: 2013-06-05 | Discharge: 2013-06-06 | Disposition: A | Discharge status: Home / Self Care | Payer: Medicare Other | Attending: Emergency Medicine | Admitting: Emergency Medicine

## 2013-06-05 ENCOUNTER — Emergency Department (INDEPENDENT_AMBULATORY_CARE_PROVIDER_SITE_OTHER)
Admission: EM | Admit: 2013-06-05 | Discharge: 2013-06-05 | Disposition: A | Discharge status: Nursing Home (Includes ICF) | Payer: Medicare Other | Source: Home / Self Care

## 2013-06-05 DIAGNOSIS — Z862 Personal history of diseases of the blood and blood-forming organs and certain disorders involving the immune mechanism: Secondary | ICD-10-CM | POA: Insufficient documentation

## 2013-06-05 DIAGNOSIS — M1990 Unspecified osteoarthritis, unspecified site: Secondary | ICD-10-CM | POA: Diagnosis not present

## 2013-06-05 DIAGNOSIS — R51 Headache: Secondary | ICD-10-CM | POA: Insufficient documentation

## 2013-06-05 DIAGNOSIS — I10 Essential (primary) hypertension: Secondary | ICD-10-CM | POA: Diagnosis not present

## 2013-06-05 DIAGNOSIS — Z7982 Long term (current) use of aspirin: Secondary | ICD-10-CM | POA: Diagnosis not present

## 2013-06-05 DIAGNOSIS — Z79899 Other long term (current) drug therapy: Secondary | ICD-10-CM | POA: Insufficient documentation

## 2013-06-05 DIAGNOSIS — G459 Transient cerebral ischemic attack, unspecified: Secondary | ICD-10-CM

## 2013-06-05 DIAGNOSIS — Z8639 Personal history of other endocrine, nutritional and metabolic disease: Secondary | ICD-10-CM | POA: Insufficient documentation

## 2013-06-05 LAB — COMPREHENSIVE METABOLIC PANEL
AST: 24 U/L (ref 0–37)
Albumin: 3.8 g/dL (ref 3.5–5.2)
Alkaline Phosphatase: 77 U/L (ref 39–117)
BUN: 17 mg/dL (ref 6–23)
CO2: 29 mEq/L (ref 19–32)
Chloride: 97 mEq/L (ref 96–112)
Potassium: 3.9 mEq/L (ref 3.5–5.1)
Total Bilirubin: 0.7 mg/dL (ref 0.3–1.2)

## 2013-06-05 LAB — CBC WITH DIFFERENTIAL/PLATELET
Basophils Relative: 0 % (ref 0–1)
Hemoglobin: 14.7 g/dL (ref 12.0–15.0)
Lymphocytes Relative: 36 % (ref 12–46)
MCHC: 34.3 g/dL (ref 30.0–36.0)
Monocytes Relative: 8 % (ref 3–12)
Neutro Abs: 3.8 10*3/uL (ref 1.7–7.7)
Neutrophils Relative %: 50 % (ref 43–77)
RBC: 4.59 MIL/uL (ref 3.87–5.11)
WBC: 7.6 10*3/uL (ref 4.0–10.5)

## 2013-06-05 MED ORDER — CLONIDINE HCL 0.1 MG PO TABS
0.1000 mg | ORAL_TABLET | Freq: Once | ORAL | Status: AC
  Administered 2013-06-05: 0.1 mg via ORAL
  Filled 2013-06-05: qty 1

## 2013-06-05 NOTE — ED Provider Notes (Addendum)
CSN: [id number]     Arrival date & time 06/05/13  2128 History   First MD Initiated Contact with Patient 06/05/13 2302     Chief Complaint  Patient presents with  . Dizziness   (Consider location/radiation/quality/duration/timing/severity/associated sxs/prior Treatment) The history is provided by the patient and a relative.   patient here complaining of dizziness with a slight headache that began this morning. Dizziness is worse when she stands up and described as mild. Denies any speech changes, unilateral weakness, vomiting. No chest pain or chest pressure. Abdominal pain. No syncope or near-syncope. Went to urgent care Center and blood pressure was elevated to a systolic of 190. I reviewed her blood pressure records from home and her systolic pressure normally runs in the 140s 150s. Patient was sent here for further evaluation.  Past Medical History  Diagnosis Date  . Hyperlipemia   . Hemorrhoids   . PMR (polymyalgia rheumatica)     ?  Marland Kitchen Osteoarthritis   . Microhematuria     s/p eval DR Patsi Sears 2004  . HTN (hypertension)    Past Surgical History  Procedure Laterality Date  . Laparoscopic hysterectomy      per Dr Lily Peer, ?oophorectomy  . Cataract extraction    . Breast biopsy  2005    benign   Family History  Problem Relation Age of Onset  . Diabetes      grandmother  . Coronary artery disease Mother     early in life   . Stroke Neg Hx   . Hypertension Neg Hx   . Cancer Neg Hx    History  Substance Use Topics  . Smoking status: Never Smoker   . Smokeless tobacco: Never Used  . Alcohol Use: No   OB History   Grav Para Term Preterm Abortions TAB SAB Ect Mult Living                 Review of Systems  All other systems reviewed and are negative.    Allergies  Review of patient's allergies indicates no known allergies.  Home Medications   Current Outpatient Rx  Name  Route  Sig  Dispense  Refill  . acetaminophen (TYLENOL) 650 MG CR tablet   Oral  Take 650 mg by mouth every 8 (eight) hours as needed.           Marland Kitchen aspirin 81 MG tablet   Oral   Take 81 mg by mouth daily.           . Calcium Carbonate-Vitamin D (CALCIUM 600 + D PO)   Oral   Take by mouth.          . carvedilol (COREG) 12.5 MG tablet   Oral   Take 12.5 mg by mouth 2 (two) times daily with a meal.         . Cholecalciferol (D3 ADULT PO)   Oral   Take by mouth.         . fish oil-omega-3 fatty acids 1000 MG capsule   Oral   Take 4 g by mouth daily.          . fosinopril (MONOPRIL) 40 MG tablet   Oral   Take 40 mg by mouth daily.         . hydrochlorothiazide (HYDRODIURIL) 25 MG tablet   Oral   Take 25 mg by mouth daily.         . Multiple Vitamin (MULTIVITAMIN PO)   Oral   Take 1 tablet  by mouth daily.           BP 209/76  Pulse 74  Temp(Src) 97.3 F (36.3 C) (Oral)  Resp 20  Ht 5' (1.524 m)  Wt 150 lb 1.6 oz (68.085 kg)  BMI 29.31 kg/m2  SpO2 97% Physical Exam  Nursing note and vitals reviewed. Constitutional: She is oriented to person, place, and time. She appears well-developed and well-nourished.  Non-toxic appearance. No distress.  HENT:  Head: Normocephalic and atraumatic.  Eyes: Conjunctivae, EOM and lids are normal. Pupils are equal, round, and reactive to light.  Neck: Normal range of motion. Neck supple. No tracheal deviation present. No mass present.  Cardiovascular: Normal rate, regular rhythm and normal heart sounds.  Exam reveals no gallop.   No murmur heard. Pulmonary/Chest: Effort normal and breath sounds normal. No stridor. No respiratory distress. She has no decreased breath sounds. She has no wheezes. She has no rhonchi. She has no rales.  Abdominal: Soft. Normal appearance and bowel sounds are normal. She exhibits no distension. There is no tenderness. There is no rebound and no CVA tenderness.  Musculoskeletal: Normal range of motion. She exhibits no edema and no tenderness.  Neurological: She is alert and  oriented to person, place, and time. She has normal strength. No cranial nerve deficit or sensory deficit. GCS eye subscore is 4. GCS verbal subscore is 5. GCS motor subscore is 6.  Skin: Skin is warm and dry. No abrasion and no rash noted.  Psychiatric: She has a normal mood and affect. Her speech is normal and behavior is normal.    ED Course  Procedures (including critical care time) Labs Review Labs Reviewed  CBC WITH DIFFERENTIAL - Abnormal; Notable for the following:    Eosinophils Relative 6 (*)    All other components within normal limits  COMPREHENSIVE METABOLIC PANEL - Abnormal; Notable for the following:    Glucose, Bld 123 (*)    GFR calc non Af Amer 81 (*)    All other components within normal limits   Imaging Review No results found.  EKG Interpretation    Date/Time:  Thursday June 05 2013 23:00:40 EST Ventricular Rate:  68 PR Interval:  225 QRS Duration: 92 QT Interval:  440 QTC Calculation: 468 R Axis:   -20 Text Interpretation:  Sinus rhythm Prolonged PR interval Low voltage, precordial leads LVH by voltage criteria aVL Borderline T abnormalities, anterior leads Confirmed by Juleen China  MD, STEPHEN ([id number]) on 06/05/2013 11:10:34 PM            MDM  No diagnosis found. His blood pressure here has improved after receiving clonidine. Patient's neurological exam is stable. Do not believe that she is PA. Suspect her symptoms are from her high blood pressure. She has been instructed to follow with her Dr. tomorrow for blood pressure adjustment  Toy Baker, MD 06/06/13 4098  Toy Baker, MD 06/06/13 [phone]

## 2013-06-05 NOTE — ED Notes (Signed)
Pt reports this morning she felt dizzy when she woke up- checked her blood pressure at home and found it to be 190 systolic, contacted PCP and was instructed to come to ED.  Neuro exam negative, pt denies being dizzy at present, denies changes in vision.

## 2013-06-05 NOTE — ED Notes (Signed)
Woke up this and was not feeling well.  She felt dizzy and checked her BP.  BP at 1230 194/102 P 65,  1430 BP 170/96 P 69  and at 1830 192/106 P 71 1839 170/114 P 67. Hx.  Hypertension.  C/o pain L side of nose and inner aspect L eye and R posterior head.

## 2013-06-05 NOTE — ED Provider Notes (Signed)
CSN: [id number]     Arrival date & time 06/05/13  1948 History   None    Chief Complaint  Patient presents with  . Hypertension   (Consider location/radiation/quality/duration/timing/severity/associated sxs/prior Treatment) HPI  Patent with dizziness and elevated BP x 1 day. Dizziness started this morning and was worsened with walking. No falls or trauma. No history of dizziness. Blood pressure also elevated into the 190's systolic today while taking normal BP meds. Also taking ASA 81 mg. No history of stroke or TIA. No chest pain. No weakness of extremities or facial droop or difficulty talking.   Past Medical History  Diagnosis Date  . Hyperlipemia   . Hemorrhoids   . PMR (polymyalgia rheumatica)     ?  Marland Kitchen Osteoarthritis   . Microhematuria     s/p eval DR Patsi Sears 2004  . HTN (hypertension)    Past Surgical History  Procedure Laterality Date  . Laparoscopic hysterectomy      per Dr Lily Peer, ?oophorectomy  . Cataract extraction    . Breast biopsy  2005    benign   Family History  Problem Relation Age of Onset  . Diabetes      grandmother  . Coronary artery disease Mother     early in life   . Stroke Neg Hx   . Hypertension Neg Hx   . Cancer Neg Hx    History  Substance Use Topics  . Smoking status: Never Smoker   . Smokeless tobacco: Never Used  . Alcohol Use: No   OB History   Grav Para Term Preterm Abortions TAB SAB Ect Mult Living                 Review of Systems See HPI  Allergies  Review of patient's allergies indicates no known allergies.  Home Medications   Current Outpatient Rx  Name  Route  Sig  Dispense  Refill  . acetaminophen (TYLENOL) 650 MG CR tablet   Oral   Take 650 mg by mouth every 8 (eight) hours as needed.           Marland Kitchen aspirin 81 MG tablet   Oral   Take 81 mg by mouth daily.           . carvedilol (COREG) 12.5 MG tablet      TAKE 1.5  TABLET BY MOUTH TWICE DAILY   135 tablet   2   . Cholecalciferol (D3 ADULT PO)  Oral   Take by mouth.         . fish oil-omega-3 fatty acids 1000 MG capsule   Oral   Take 4 g by mouth daily.          . fosinopril (MONOPRIL) 40 MG tablet      TAKE TWO TABLETS BY MOUTH ONCE DAILY   180 tablet   2     Needs OV   . hydrochlorothiazide (HYDRODIURIL) 25 MG tablet      TAKE ONE TABLET BY MOUTH ONCE DAILY   90 tablet   2     Needs OV   . Multiple Vitamin (MULTIVITAMIN PO)   Oral   Take by mouth.           . Calcium Carbonate-Vitamin D (CALCIUM 600 + D PO)   Oral   Take by mouth.            BP 184/82  Pulse 70  Temp(Src) 98.5 F (36.9 C) (Oral)  Resp 18  SpO2 94% Physical  Exam  Constitutional: She is oriented to person, place, and time. She appears well-developed and well-nourished. No distress.  Cardiovascular: Normal rate, regular rhythm and normal heart sounds.   Pulmonary/Chest: Effort normal and breath sounds normal.  Abdominal: Soft.  Neurological: She is alert and oriented to person, place, and time. She has normal reflexes. No cranial nerve deficit. Coordination normal.  Gait difficult to analyze due to severe osteoarthritis, but not frankly ataxic  Skin: She is not diaphoretic.    ED Course  Procedures (including critical care time) Labs Review Labs Reviewed - No data to display Imaging Review No results found.    MDM   1. TIA (transient ischemic attack)    Given dizziness with walking in setting of abnormally high blood pressure, I am concerned about a TIA or small stroke of cerebellum. No focal deficits appreciated at current time, so not a code stroke. Recommended to patient and patient's daughter that she be evaluated in the ED for possible stroke. They were in agreement with the plan.     Garnetta Buddy, MD 06/05/13 2201

## 2013-06-05 NOTE — ED Notes (Signed)
Pt states that when she is walking she will get a little dizzy, but as long as she is sitting still she feels normal.  Pt states she had a slight headache this AM, but is resolved quickly.

## 2013-06-06 ENCOUNTER — Telehealth: Payer: Self-pay | Admitting: Internal Medicine

## 2013-06-06 ENCOUNTER — Emergency Department (HOSPITAL_COMMUNITY): Payer: Medicare Other

## 2013-06-06 NOTE — Telephone Encounter (Signed)
Was seen at the ED, needs follow up next week, please call her Monday and set that up

## 2013-06-06 NOTE — ED Provider Notes (Signed)
Medical screening examination/treatment/procedure(s) were performed by resident physician or non-physician practitioner and as supervising physician I was immediately available for consultation/collaboration.   Johsua Shevlin DOUGLAS MD.   Chase Arnall D Rubel Heckard, MD 06/06/13 1707 

## 2013-06-07 ENCOUNTER — Encounter: Payer: Self-pay | Admitting: Family Medicine

## 2013-06-07 ENCOUNTER — Ambulatory Visit (INDEPENDENT_AMBULATORY_CARE_PROVIDER_SITE_OTHER): Payer: Medicare Other | Admitting: Family Medicine

## 2013-06-07 VITALS — BP 140/80 | HR 72 | Temp 98.2°F | Resp 20 | Wt 146.0 lb

## 2013-06-07 DIAGNOSIS — I10 Essential (primary) hypertension: Secondary | ICD-10-CM

## 2013-06-07 MED ORDER — CLONIDINE HCL 0.1 MG PO TABS: 0.1000 mg | ORAL_TABLET | Freq: Two times a day (BID) | ORAL | Status: DC

## 2013-06-07 NOTE — Progress Notes (Signed)
Subjective:    Patient ID: Alison Graves, female    DOB: 09-02-1931, 80 y.o.   MRN: [id number]  HPI Here for HTN On Thursday she did not feel well - took her bp and it was 192/106  Re check 167/107--then [facility] to [facility]- where she was sent to ER   (bp also very high at [facility]) Went on to ER and they gave her a bp pill and took blood and did a head scan- obs until late night  bp was good when she left  Pulse in the 60s   Yesterday afternoon bp was up too 171/96 Last night -it was down significantly 130s/80s  Thinks her cuff is accurate   Right now she feels fine  Gets a headache when her bp goes high She is on 3 different agents right now - beta blocker and ace and hctz   No new stressors but she gets anxious easily and is a worrier   Patient Active Problem List   Diagnosis Date Noted  . Anxiety 11/16/2011  . Medicare annual wellness visit, initial 10/04/2010  . Osteopenia 10/04/2010  . MENOPAUSE-RELATED VASOMOTOR SYMPTOMS 04/19/2009  . OSTEOARTHRITIS 12/16/2008  . Polymyalgia rheumatica 01/10/2008  . HYPERLIPIDEMIA 11/29/2006  . HYPERTENSION 11/20/2006  . HEMORRHOIDS 07/25/2006   Past Medical History  Diagnosis Date  . Hyperlipemia   . Hemorrhoids   . PMR (polymyalgia rheumatica)     ?  Marland Kitchen Osteoarthritis   . Microhematuria     s/p eval DR Patsi Sears 2004  . HTN (hypertension)    Past Surgical History  Procedure Laterality Date  . Laparoscopic hysterectomy      per Dr Lily Peer, ?oophorectomy  . Cataract extraction    . Breast biopsy  2005    benign   History  Substance Use Topics  . Smoking status: Never Smoker   . Smokeless tobacco: Never Used  . Alcohol Use: No   Family History  Problem Relation Age of Onset  . Diabetes      grandmother  . Coronary artery disease Mother     early in life   . Stroke Neg Hx   . Hypertension Neg Hx   . Cancer Neg Hx    No Known Allergies Current Outpatient Prescriptions on File Prior to Visit  Medication Sig  Dispense Refill  . acetaminophen (TYLENOL) 650 MG CR tablet Take 650 mg by mouth every 8 (eight) hours as needed.        Marland Kitchen aspirin 81 MG tablet Take 81 mg by mouth daily.        . Calcium Carbonate-Vitamin D (CALCIUM 600 + D PO) Take by mouth.       . carvedilol (COREG) 12.5 MG tablet Take 12.5 mg by mouth 2 (two) times daily with a meal.      . Cholecalciferol (D3 ADULT PO) Take by mouth.      . fish oil-omega-3 fatty acids 1000 MG capsule Take 4 g by mouth daily.       . fosinopril (MONOPRIL) 40 MG tablet Take 40 mg by mouth daily.      . hydrochlorothiazide (HYDRODIURIL) 25 MG tablet Take 25 mg by mouth daily.      . Multiple Vitamin (MULTIVITAMIN PO) Take 1 tablet by mouth daily.        No current facility-administered medications on file prior to visit.      Review of Systems Review of Systems  Constitutional: Negative for fever, appetite change, fatigue and unexpected  weight change.  Eyes: Negative for pain and visual disturbance.  Respiratory: Negative for cough and shortness of breath.   Cardiovascular: Negative for cp or palpitations    Gastrointestinal: Negative for nausea, diarrhea and constipation.  Genitourinary: Negative for urgency and frequency.  Skin: Negative for pallor or rash   Neurological: Negative for weakness, light-headedness, numbness and pos for ha yesterday (better today)  Hematological: Negative for adenopathy. Does not bruise/bleed easily.  Psychiatric/Behavioral: Negative for dysphoric mood. The patient is  nervous/anxious.         Objective:   Physical Exam  Constitutional: She appears well-developed and well-nourished. No distress.  HENT:  Head: Normocephalic and atraumatic.  Mouth/Throat: Oropharynx is clear and moist.  Eyes: Conjunctivae and EOM are normal. Pupils are equal, round, and reactive to light. No scleral icterus.  Neck: Normal range of motion. Neck supple. No JVD present. Carotid bruit is not present. No thyromegaly present.    Cardiovascular: Normal rate, regular rhythm, normal heart sounds and intact distal pulses.  Exam reveals no gallop.   Pulmonary/Chest: Effort normal and breath sounds normal. No respiratory distress. She has no wheezes. She has no rales.  No crackles   Abdominal: Soft. Bowel sounds are normal. She exhibits no abdominal bruit.  Musculoskeletal: Normal range of motion. She exhibits no edema.  Lymphadenopathy:    She has no cervical adenopathy.  Neurological: She is alert. She has normal reflexes. No cranial nerve deficit. She exhibits normal muscle tone. Coordination normal.  Skin: Skin is warm and dry. No rash noted. No erythema. No pallor.  Psychiatric: Her mood appears anxious.  Mildly anxious           Assessment & Plan:

## 2013-06-07 NOTE — Patient Instructions (Signed)
Add clonidine  0.1 mg twice daily for high blood pressure If side effects or blood pressure is too low- hold it and let us know right away Continue low salt diet See Dr Drue Novel as planned

## 2013-06-07 NOTE — Progress Notes (Signed)
Pre-visit discussion using our clinic review tool. No additional management support is needed unless otherwise documented below in the visit note.  

## 2013-06-08 NOTE — Assessment & Plan Note (Signed)
Recent inc in bp with ER visit  Currently on 3 agents  Some anxiety may play a role  bp much imp today but pt voices great anx over inc bp and wants to be more aggressive with it  Will cut back on beta blocker to bid and add clonidine .1 bid (which she tolerated well in ER) Disc poss side eff in detail- if problems or low bp inst to call/ and family will watch her closely  F/u Dr Drue Novel next week as planned

## 2013-06-09 ENCOUNTER — Ambulatory Visit (INDEPENDENT_AMBULATORY_CARE_PROVIDER_SITE_OTHER): Payer: Medicare Other | Admitting: Internal Medicine

## 2013-06-09 ENCOUNTER — Encounter: Payer: Self-pay | Admitting: Internal Medicine

## 2013-06-09 VITALS — BP 149/78 | HR 73 | Temp 98.2°F | Wt 145.0 lb

## 2013-06-09 DIAGNOSIS — I10 Essential (primary) hypertension: Secondary | ICD-10-CM

## 2013-06-09 NOTE — Progress Notes (Signed)
   Subjective:    Patient ID: Alison Graves, female    DOB: 1932-06-22, 79 y.o.   MRN: [id number]  HPI Followup visit, having problems with her BP. BP was as high as  192/106 , went to [facility] 06-06-13, she was sent to ER (bp also very high at [facility]) ;  She also had dizzines, BP was 209/76, got clonidine, BP improved Was seen at the Saturday clinic 06-07-13 , Beta blockers does decrease and they added clonidine 0.1  twice a day. Labs reviewed-- normal CMP CBC Here for a followup    Past Medical History  Diagnosis Date  . Hyperlipemia   . Hemorrhoids   . PMR (polymyalgia rheumatica)     ?  Marland Kitchen Osteoarthritis   . Microhematuria     s/p eval DR Patsi Sears 2004  . HTN (hypertension)    Past Surgical History  Procedure Laterality Date  . Laparoscopic hysterectomy      per Dr Lily Peer, ?oophorectomy  . Cataract extraction    . Breast biopsy  2005    benign   History   Social History  . Marital Status: Widowed    Spouse Name: N/A    Number of Children: 3  . Years of Education: N/A   Occupational History  . retired     Social History Main Topics  . Smoking status: Never Smoker   . Smokeless tobacco: Never Used  . Alcohol Use: No  . Drug Use: No  . Sexual Activity: Yes    Birth Control/ Protection: Surgical   Other Topics Concern  . Not on file   Social History Narrative   Widow,  Original from [place] , lives w/ her 3 daughters    Review of Systems Since her visit to the Saturday clinic she's taking her medications as prescribed, BP has been rather low ranging from 90 to 130/60 and 70. Despite low numbers, she feels well, denies dizziness, chest pain, shortness of breath or extremity edema. Last night, she called the answering service and was recommended to hold her BP medication until she talks to me which she did, she has not taken most of her medications today. I asked about excessive use of salt or taking anti-inflammatories as a cause for her elevated blood pressure  she denies it. She did admit  to some stress due to family issues.     Objective:   Physical Exam BP 149/78  Pulse 73  Temp(Src) 98.2 F (36.8 C)  Wt 145 lb (65.772 kg)  SpO2 94% General -- alert, well-developed, NAD.   Lungs -- normal respiratory effort, no intercostal retractions, no accessory muscle use, and normal breath sounds.  Heart-- normal rate, regular rhythm, no murmur.  Extremities-- no pretibial edema bilaterally  Psych-- Cognition and judgment appear intact. Cooperative with normal attention span and concentration. No anxious appearing , no depressed appearing.      Assessment & Plan:  Today , I spent more than 25  min with the patient, >50% of the time counseling, and /or reviewing the chart and labs ordered by other providers

## 2013-06-09 NOTE — Patient Instructions (Signed)
Take medicines as prescribed except hydrochlorothiazide    Check the  blood pressure 2   times a day be sure it is between 110/60 and 140/85. Ideal blood pressure is 120/80. If it is consistently higher or lower, let me know  Call with readings in one week  Next visit for a   follow up  regards   hypertension  , no fasting, in 4 weeks  Please make an appointment

## 2013-06-09 NOTE — Assessment & Plan Note (Addendum)
BP recently elevated, no clear cause for her BP elevation except some  stress, she was counseling about that today. Recent labs normal. Current medications Monopril 40 mg carvedilol 12.5 twice a day, clonodin 0.1 twice a day, Hydrochlorothiazide 25 mg daily. clonodine is definitely helping, BPs not longer in the 200s, however it is dropping her BP too much; BP today at the time of her visit elevated because she has skipped some of her medications, see history of present illness.. Plan:  Hold   Hydrochlorothiazide, otherwise continue same meds Call  with BP readings in one week, sooner if BP too high or too low. Plan discussed with the patient and her daughter. Office visit in one month

## 2013-06-09 NOTE — Telephone Encounter (Signed)
Pt already has apt . For 06/09/13

## 2013-06-09 NOTE — Progress Notes (Signed)
Pre visit review using our clinic review tool, if applicable. No additional management support is needed unless otherwise documented below in the visit note. 

## 2013-07-04 ENCOUNTER — Telehealth: Payer: Self-pay | Admitting: Internal Medicine

## 2013-07-04 NOTE — Telephone Encounter (Signed)
error 

## 2013-07-08 ENCOUNTER — Encounter: Payer: Self-pay | Admitting: Internal Medicine

## 2013-07-08 ENCOUNTER — Ambulatory Visit (INDEPENDENT_AMBULATORY_CARE_PROVIDER_SITE_OTHER): Payer: Medicare Other | Admitting: Internal Medicine

## 2013-07-08 VITALS — BP 160/78 | HR 68 | Temp 98.2°F | Wt 149.0 lb

## 2013-07-08 DIAGNOSIS — I10 Essential (primary) hypertension: Secondary | ICD-10-CM

## 2013-07-08 MED ORDER — CLONIDINE HCL 0.1 MG PO TABS: 0.1000 mg | ORAL_TABLET | Freq: Two times a day (BID) | ORAL | Status: DC

## 2013-07-08 NOTE — Assessment & Plan Note (Signed)
BP in general very good, currently on carvedilol 12.5 twice a day, clonidine 0.1 twice a day and fosinopril 40 mg daily. Plan: Continue with same therapy, continue monitoring her BP, if BPs are consistently more than 160/90 she will let me know otherwise follow up in 2 months. Refill clonidine

## 2013-07-08 NOTE — Progress Notes (Signed)
Pre visit review using our clinic review tool, if applicable. No additional management support is needed unless otherwise documented below in the visit note. 

## 2013-07-08 NOTE — Progress Notes (Signed)
   Subjective:    Patient ID: Alison Graves, female    DOB: 08/23/1931, 80 y.o.   MRN: [id number]  HPI ROV, today we discussed the following issues: Hypertension--good medication compliance with medicines as prescribed.  Systolic ambulatory BPs are 123, 115, 146, 156, 128. Diastolic BPs 70, 80. Pulse in the 60s. In general she feels well.  Past Medical History  Diagnosis Date  . Hyperlipemia   . Hemorrhoids   . PMR (polymyalgia rheumatica)     ?  Marland Kitchen. Osteoarthritis   . Microhematuria     s/p eval DR Patsi Searsannenbaum 2004  . HTN (hypertension)    Past Surgical History  Procedure Laterality Date  . Laparoscopic hysterectomy      per Dr Lily PeerFernandez, ?oophorectomy  . Cataract extraction    . Breast biopsy  2005    benign    Review of Systems Denies nausea, vomiting, diarrhea. No headaches, fever, weight loss. No cough Mild anxiety at baseline.    Objective:   Physical Exam  BP 160/78  Pulse 68  Temp(Src) 98.2 F (36.8 C)  Wt 149 lb (67.586 kg)  SpO2 97% General -- alert, well-developed, NAD.  Lungs -- normal respiratory effort, no intercostal retractions, no accessory muscle use, and normal breath sounds.  Heart-- normal rate, regular rhythm, no murmur.  Extremities-- trace pretibial edema bilaterally  Neurologic--  alert & oriented X3. Speech normal, gait normal, strength normal in all extremities.  Psych-- No anxious or depressed appearing.     Assessment & Plan:

## 2013-07-08 NOTE — Patient Instructions (Signed)
Next visit is for a physical exam in  3 months, come back fasting Please make an appointment    Continue tomando las mismas medicinas llame si la presion esta mas de 160/90 consistentemente

## 2013-09-30 ENCOUNTER — Other Ambulatory Visit: Payer: Self-pay

## 2013-09-30 ENCOUNTER — Ambulatory Visit
Admission: RE | Admit: 2013-09-30 | Discharge: 2013-09-30 | Disposition: A | Discharge status: Home / Self Care | Payer: Medicare Other | Source: Ambulatory Visit

## 2013-09-30 DIAGNOSIS — Z1231 Encounter for screening mammogram for malignant neoplasm of breast: Secondary | ICD-10-CM

## 2013-10-02 ENCOUNTER — Other Ambulatory Visit: Payer: Self-pay | Admitting: Internal Medicine

## 2013-10-06 ENCOUNTER — Ambulatory Visit: Payer: Medicare Other | Admitting: Internal Medicine

## 2013-10-07 ENCOUNTER — Other Ambulatory Visit: Payer: Self-pay | Admitting: Internal Medicine

## 2013-10-07 DIAGNOSIS — R928 Other abnormal and inconclusive findings on diagnostic imaging of breast: Secondary | ICD-10-CM

## 2013-10-22 ENCOUNTER — Ambulatory Visit
Admission: RE | Admit: 2013-10-22 | Discharge: 2013-10-22 | Disposition: A | Discharge status: Home / Self Care | Payer: Medicare Other | Source: Ambulatory Visit | Attending: Internal Medicine | Admitting: Internal Medicine

## 2013-10-22 DIAGNOSIS — R928 Other abnormal and inconclusive findings on diagnostic imaging of breast: Secondary | ICD-10-CM

## 2013-11-04 ENCOUNTER — Telehealth: Payer: Self-pay

## 2013-11-04 NOTE — Telephone Encounter (Signed)
Left message for call back Non identifiable  Pap--hysterectomy, no hx abnormal Paps CCS--2007--Dr Mann--no changes from previous MMG--08/2013---Benign findings Bone Density--10/2012--osteopenia Flu vaccine--04/2013 Tdap--2011 PNA--2010 Shingles vaccine--2010

## 2013-11-05 ENCOUNTER — Telehealth: Payer: Self-pay | Admitting: Internal Medicine

## 2013-11-05 ENCOUNTER — Encounter: Payer: Self-pay | Admitting: Internal Medicine

## 2013-11-05 ENCOUNTER — Ambulatory Visit (INDEPENDENT_AMBULATORY_CARE_PROVIDER_SITE_OTHER): Payer: Medicare Other | Admitting: Internal Medicine

## 2013-11-05 VITALS — BP 135/73 | HR 73 | Temp 97.9°F | Ht <= 58 in | Wt 146.0 lb

## 2013-11-05 DIAGNOSIS — Z0000 Encounter for general adult medical examination without abnormal findings: Secondary | ICD-10-CM

## 2013-11-05 DIAGNOSIS — Z23 Encounter for immunization: Secondary | ICD-10-CM

## 2013-11-05 DIAGNOSIS — M899 Disorder of bone, unspecified: Secondary | ICD-10-CM | POA: Diagnosis not present

## 2013-11-05 DIAGNOSIS — E785 Hyperlipidemia, unspecified: Secondary | ICD-10-CM | POA: Diagnosis not present

## 2013-11-05 DIAGNOSIS — I10 Essential (primary) hypertension: Secondary | ICD-10-CM | POA: Diagnosis not present

## 2013-11-05 DIAGNOSIS — M949 Disorder of cartilage, unspecified: Secondary | ICD-10-CM

## 2013-11-05 DIAGNOSIS — M1990 Unspecified osteoarthritis, unspecified site: Secondary | ICD-10-CM

## 2013-11-05 DIAGNOSIS — M8580 Other specified disorders of bone density and structure, unspecified site: Secondary | ICD-10-CM

## 2013-11-05 LAB — COMPREHENSIVE METABOLIC PANEL
ALT: 18 U/L (ref 0–35)
AST: 25 U/L (ref 0–37)
Albumin: 3.7 g/dL (ref 3.5–5.2)
Alkaline Phosphatase: 71 U/L (ref 39–117)
BILIRUBIN TOTAL: 0.3 mg/dL (ref 0.2–1.2)
BUN: 20 mg/dL (ref 6–23)
CO2: 28 meq/L (ref 19–32)
Calcium: 9.5 mg/dL (ref 8.4–10.5)
Chloride: 103 mEq/L (ref 96–112)
Creatinine, Ser: 1 mg/dL (ref 0.4–1.2)
GFR: 60.16 mL/min (ref >60.00)
GLUCOSE: 102 mg/dL — AB (ref 70–99)
Potassium: 4.1 mEq/L (ref 3.5–5.1)
Sodium: 138 mEq/L (ref 135–145)
TOTAL PROTEIN: 6.6 g/dL (ref 6.0–8.3)

## 2013-11-05 LAB — LIPID PANEL
CHOLESTEROL: 221 mg/dL — AB (ref 0–200)
HDL: 53.3 mg/dL (ref >39.00)
LDL Cholesterol: 129 mg/dL — ABNORMAL HIGH (ref 0–99)
TRIGLYCERIDES: 192 mg/dL — AB (ref 0.0–149.0)
Total CHOL/HDL Ratio: 4
VLDL: 38.4 mg/dL (ref 0.0–40.0)

## 2013-11-05 MED ORDER — CLONIDINE HCL 0.1 MG PO TABS: 0.1000 mg | ORAL_TABLET | Freq: Three times a day (TID) | ORAL | Status: DC

## 2013-11-05 NOTE — Patient Instructions (Signed)
Get your blood work before you leave   Increase clonidine to 1 tablet 3 times a day Other medications the same  Check the  blood pressure 2 or 3 times a  week be sure it is between 110/60 and 140/85. Ideal blood pressure is 120/80. If it is consistently higher or lower, let me know   Next visit is for routine check up regards your blood   pressure in 4 months  No need to come back fasting Please make an appointment       Fall Prevention and Home Safety Falls cause injuries and can affect all age groups. It is possible to use preventive measures to significantly decrease the likelihood of falls. There are many simple measures which can make your home safer and prevent falls. OUTDOORS  Repair cracks and edges of walkways and driveways.  Remove high doorway thresholds.  Trim shrubbery on the main path into your home.  Have good outside lighting.  Clear walkways of tools, rocks, debris, and clutter.  Check that handrails are not broken and are securely fastened. Both sides of steps should have handrails.  Have leaves, snow, and ice cleared regularly.  Use sand or salt on walkways during winter months.  In the garage, clean up grease or oil spills. BATHROOM  Install night lights.  Install grab bars by the toilet and in the tub and shower.  Use non-skid mats or decals in the tub or shower.  Place a plastic non-slip stool in the shower to sit on, if needed.  Keep floors dry and clean up all water on the floor immediately.  Remove soap buildup in the tub or shower on a regular basis.  Secure bath mats with non-slip, double-sided rug tape.  Remove throw rugs and tripping hazards from the floors. BEDROOMS  Install night lights.  Make sure a bedside light is easy to reach.  Do not use oversized bedding.  Keep a telephone by your bedside.  Have a firm chair with side arms to use for getting dressed.  Remove throw rugs and tripping hazards from the  floor. KITCHEN  Keep handles on pots and pans turned toward the center of the stove. Use back burners when possible.  Clean up spills quickly and allow time for drying.  Avoid walking on wet floors.  Avoid hot utensils and knives.  Position shelves so they are not too high or low.  Place commonly used objects within easy reach.  If necessary, use a sturdy step stool with a grab bar when reaching.  Keep electrical cables out of the way.  Do not use floor polish or wax that makes floors slippery. If you must use wax, use non-skid floor wax.  Remove throw rugs and tripping hazards from the floor. STAIRWAYS  Never leave objects on stairs.  Place handrails on both sides of stairways and use them. Fix any loose handrails. Make sure handrails on both sides of the stairways are as long as the stairs.  Check carpeting to make sure it is firmly attached along stairs. Make repairs to worn or loose carpet promptly.  Avoid placing throw rugs at the top or bottom of stairways, or properly secure the rug with carpet tape to prevent slippage. Get rid of throw rugs, if possible.  Have an electrician put in a light switch at the top and bottom of the stairs. OTHER FALL PREVENTION TIPS  Wear low-heel or rubber-soled shoes that are supportive and fit well. Wear closed toe shoes.  When  using a stepladder, make sure it is fully opened and both spreaders are firmly locked. Do not climb a closed stepladder.  Add color or contrast paint or tape to grab bars and handrails in your home. Place contrasting color strips on first and last steps.  Learn and use mobility aids as needed. Install an electrical emergency response system.  Turn on lights to avoid dark areas. Replace light bulbs that burn out immediately. Get light switches that glow.  Arrange furniture to create clear pathways. Keep furniture in the same place.  Firmly attach carpet with non-skid or double-sided tape.  Eliminate uneven  floor surfaces.  Select a carpet pattern that does not visually hide the edge of steps.  Be aware of all pets. OTHER HOME SAFETY TIPS  Set the water temperature for 120 F (48.8 C).  Keep emergency numbers on or near the telephone.  Keep smoke detectors on every level of the home and near sleeping areas. Document Released: 06/09/2002 Document Revised: 12/19/2011 Document Reviewed: 09/08/2011 [facility] Patient Information 2014 [place], [place].

## 2013-11-05 NOTE — Assessment & Plan Note (Addendum)
Td-- 2-11 pneumonia shot 2010 shingles shot 3-10 prevnar today  Gyn, no recent visit w/ Dr Lily PeerFernandez ; s/p hysterectomy, never had an abnormal PAP: no further screening is recommended Last MMG 08-2013 neg , most medical societies recommend no screening after age 78, she has no family history. rec to  discontinue mammograms .  Cscope 2007 Dr Loreta AveMann, Bx no adenomatous changes, no know  FH, we agreed no further screening Labs  Diet discussed, exercise limited by DJD

## 2013-11-05 NOTE — Assessment & Plan Note (Addendum)
BPs occasionally elevated in the ambulatory setting, will increase clonidine to 3 times a day, continue with other medications. (taking medications 3 times a day is not an issue for her) Continue monitoring BP , followup in 4 months. See instructions

## 2013-11-05 NOTE — Progress Notes (Signed)
Subjective:    Patient ID: Alison Graves, female    DOB: 08/01/1931, 80 y.o.   MRN: [id number]  DOS:  11/05/2013 Type of  visit:  Here for Medicare AWV: 1. Risk factors based on Past M, S, F history: reviewed 2. Physical Activities: sedentary, unable to walk d/t knee pain   3. Depression/mood:  (-) screening   4. Hearing: No problemss noted or reported , no tinnitus 5. ADL's: independent, lives w/ her daughters, does not drive    6. Fall Risk: no recent falls, see A/p 7. home Safety: does feel safe at home   8. Height, weight, &visual acuity: see VS, vision corrected. Saw ophthalmology ~ 6 months per pt 9. Counseling: provided 10. Labs ordered based on risk factors: if needed   11. Referral Coordination: if needed 12.  Care Plan, see assessment and plan   13.   Cognitive Assessment: motor skills limited by DJD, cognition appropriate   In addition, today we discussed the following:  hypertension, good medication compliance, BPs are checked frequently and range from the 120s to 170s. Pulse is consistently in the 60s. Osteopenia, on calcium and vitamin D, unable to exercise much. Last bone density test reviewed. DJD, limiting her ability to exercise but pain well-controlled with Tylenol as needed.  ROS No  CP, SOB No palpitations, no lower extremity edema Denies  nausea, vomiting diarrhea Denies  blood in the stools (-) cough, sputum production (-) wheezing, chest congestion  No dysuria, gross hematuria, difficulty urinating  No vaginal bleed. + dry mouth     Past Medical History  Diagnosis Date  . Hyperlipemia   . Hemorrhoids   . PMR (polymyalgia rheumatica)     ?  Marland Kitchen. Osteoarthritis   . Microhematuria     s/p eval DR Patsi Searsannenbaum 2004  . HTN (hypertension)     Past Surgical History  Procedure Laterality Date  . Laparoscopic hysterectomy      per Dr Lily PeerFernandez, ?oophorectomy  . Cataract extraction    . Breast biopsy  2005    benign    History   Social History    . Marital Status: Widowed    Spouse Name: N/A    Number of Children: 3  . Years of Education: N/A   Occupational History  . retired     Social History Main Topics  . Smoking status: Never Smoker   . Smokeless tobacco: Never Used  . Alcohol Use: No  . Drug Use: No  . Sexual Activity: Yes    Birth Control/ Protection: Surgical   Other Topics Concern  . Not on file   Social History Narrative   Widow,  Original from [place] , lives w/ her 3 daughters     Family History  Problem Relation Age of Onset  . Diabetes Other     grandmother  . Coronary artery disease Mother     early in life   . Stroke Neg Hx   . Hypertension Neg Hx   . Cancer Neg Hx        Medication List       This list is accurate as of: 11/05/13  6:11 PM.  Always use your most recent med list.               acetaminophen 650 MG CR tablet  Commonly known as:  TYLENOL  Take 650 mg by mouth every 8 (eight) hours as needed.     aspirin 81 MG tablet  Take 81  mg by mouth daily.     CALCIUM 600 + D PO  Take by mouth.     carvedilol 12.5 MG tablet  Commonly known as:  COREG  Take 12.5 mg by mouth 2 (two) times daily with a meal.     cloNIDine 0.1 MG tablet  Commonly known as:  CATAPRES  Take 1 tablet (0.1 mg total) by mouth 3 (three) times daily.     D3 ADULT PO  Take by mouth.     fish oil-omega-3 fatty acids 1000 MG capsule  Take 4 g by mouth daily.     fosinopril 40 MG tablet  Commonly known as:  MONOPRIL  Take 40 mg by mouth daily.     MULTIVITAMIN PO  Take 1 tablet by mouth daily.           Objective:   Physical Exam BP 135/73  Pulse 73  Temp(Src) 97.9 F (36.6 C)  Ht 4' 9.8" (1.468 m)  Wt 146 lb (66.225 kg)  BMI 30.73 kg/m2  SpO2 95% General -- alert, well-developed, NAD.  Neck --no thyromegaly  HEENT-- Not pale. Lungs -- normal respiratory effort, no intercostal retractions, no accessory muscle use, and normal breath sounds.  Heart-- normal rate, regular rhythm,  no murmur.  Abdomen-- Not distended, good bowel sounds,soft, non-tender.  Extremities-- no pretibial edema bilaterally  Neurologic--  alert & oriented X3. Speech normal, gait difficult d/t DJD,  strength symmetric in all extremities.  Psych-- Cognition and judgment appear intact. Cooperative with normal attention span and concentration. No anxious or depressed appearing.          Assessment & Plan:

## 2013-11-05 NOTE — Progress Notes (Signed)
Pre visit review using our clinic review tool, if applicable. No additional management support is needed unless otherwise documented below in the visit note. 

## 2013-11-05 NOTE — Assessment & Plan Note (Signed)
on Tylenol as needed

## 2013-11-05 NOTE — Telephone Encounter (Signed)
Relevant patient education assigned to patient using Emmi. ° °

## 2013-11-05 NOTE — Assessment & Plan Note (Signed)
DEXA 08-2012 osteopenia, T score improved, on ca and vit d

## 2013-12-09 ENCOUNTER — Encounter: Payer: Self-pay | Admitting: Internal Medicine

## 2013-12-09 ENCOUNTER — Ambulatory Visit (INDEPENDENT_AMBULATORY_CARE_PROVIDER_SITE_OTHER): Payer: Medicare Other | Admitting: Internal Medicine

## 2013-12-09 VITALS — BP 172/86 | HR 82 | Temp 98.0°F | Wt 145.0 lb

## 2013-12-09 DIAGNOSIS — I10 Essential (primary) hypertension: Secondary | ICD-10-CM

## 2013-12-09 MED ORDER — CARVEDILOL 12.5 MG PO TABS: 12.5000 mg | ORAL_TABLET | Freq: Two times a day (BID) | ORAL | Status: DC

## 2013-12-09 MED ORDER — CLONIDINE HCL 0.2 MG PO TABS: 0.2000 mg | ORAL_TABLET | Freq: Three times a day (TID) | ORAL | Status: DC

## 2013-12-09 NOTE — Progress Notes (Signed)
Pre visit review using our clinic review tool, if applicable. No additional management support is needed unless otherwise documented below in the visit note. 

## 2013-12-09 NOTE — Patient Instructions (Signed)
Change to  clonidine to 0.2 mg one tablet 3 times a day Continue monitoring your BPs If they drop to  less than 110/60 or you feel weak, then  only take one tablet 2 times a day.   CAMBIE A CLONIDIN 0.2 MG 1 TABLETA 3 VECES AL DIA  SIGA TOMANDOSE LA PRESION, SI SE LE BAJA A MENOS DE 110/60 O SE SIENTE MAL CON LA PASTILLA, DISMINUYA LA  CLONIDINE A 1 TABLETA 2 VECES AL DIA

## 2013-12-09 NOTE — Progress Notes (Signed)
Subjective:    Patient ID: Alison Graves, female    DOB: [birth year], 80 y.o.   MRN: [id number]  DOS:  12/09/2013 Type of  Visit: Acute, hypertension management History:  At the last visit, clonidine dose was increased, since then ambulatory BPs dropped from the 160s to the 140s, 150s. Pulse remains in the 60s. She is concerned because she would like to see her blood pressure better.   ROS Denies chest pain, difficulty breathing or lower extremity edema Is not taking any Motrin or similar medications No headache or dizziness She does follow a low-salt diet   Past Medical History  Diagnosis Date  . Hyperlipemia   . Hemorrhoids   . PMR (polymyalgia rheumatica)     ?  Marland Kitchen Osteoarthritis   . Microhematuria     s/p eval DR Patsi Sears 2004  . HTN (hypertension)     Past Surgical History  Procedure Laterality Date  . Laparoscopic hysterectomy      per Dr Lily Peer, ?oophorectomy  . Cataract extraction    . Breast biopsy  2005    benign    History   Social History  . Marital Status: Widowed    Spouse Name: N/A    Number of Children: 3  . Years of Education: N/A   Occupational History  . retired     Social History Main Topics  . Smoking status: Never Smoker   . Smokeless tobacco: Never Used  . Alcohol Use: No  . Drug Use: No  . Sexual Activity: Yes    Birth Control/ Protection: Surgical   Other Topics Concern  . Not on file   Social History Narrative   Widow,  Original from [place] , lives w/ her 3 daughters        Medication List       This list is accurate as of: 12/09/13  7:21 PM.  Always use your most recent med list.               acetaminophen 650 MG CR tablet  Commonly known as:  TYLENOL  Take 650 mg by mouth every 8 (eight) hours as needed.     aspirin 81 MG tablet  Take 81 mg by mouth daily.     CALCIUM 600 + D PO  Take by mouth.     carvedilol 12.5 MG tablet  Commonly known as:  COREG  Take 1 tablet (12.5 mg total) by mouth 2  (two) times daily with a meal.     cloNIDine 0.2 MG tablet  Commonly known as:  CATAPRES  Take 1 tablet (0.2 mg total) by mouth 3 (three) times daily.     D3 ADULT PO  Take by mouth.     fish oil-omega-3 fatty acids 1000 MG capsule  Take 4 g by mouth daily.     fosinopril 40 MG tablet  Commonly known as:  MONOPRIL  Take 40 mg by mouth daily.     MULTIVITAMIN PO  Take 1 tablet by mouth daily.           Objective:   Physical Exam BP 172/86  Pulse 82  Temp(Src) 98 F (36.7 C)  Wt 145 lb (65.772 kg)  SpO2 93% General -- alert, well-developed, NAD.   Lungs -- normal respiratory effort, no intercostal retractions, no accessory muscle use, and normal breath sounds.  Heart-- normal rate, regular rhythm, no murmur.  Extremities-- trace pretibial edema bilaterally  Neurologic--  alert & oriented X3. Speech normal, gait  appropriate for age, strength symmetric and appropriate for age.   Psych-- Cognition and judgment appear intact. Cooperative with normal attention span and concentration. No anxious or depressed appearing.        Assessment & Plan:

## 2013-12-09 NOTE — Assessment & Plan Note (Addendum)
Recently  increased clonidine 0.1 mg  to 3 times a day. Ambulatory BPs decreased from date 160 to 140-150 range. BP today is elevated. Options: increase clonidine 0.1 tid to 0.2 mg tid  Add  Hydrochlorothiazide (buut she is leaving the [place] in few days and we won't be able to check a BMP) Plan: Increased clonidin continue with other medications, Continue monitor BPs, if BP  less than 110/60 consistently or if she has side effects---->  decrease clonidine 0.2 mg to 1 tab  twice a day only. Patient verbalized understanding.

## 2014-02-06 DIAGNOSIS — M1710 Unilateral primary osteoarthritis, unspecified knee: Secondary | ICD-10-CM | POA: Diagnosis not present

## 2014-02-06 DIAGNOSIS — M25569 Pain in unspecified knee: Secondary | ICD-10-CM | POA: Diagnosis not present

## 2014-02-23 DIAGNOSIS — M1710 Unilateral primary osteoarthritis, unspecified knee: Secondary | ICD-10-CM | POA: Diagnosis not present

## 2014-03-03 DIAGNOSIS — M1710 Unilateral primary osteoarthritis, unspecified knee: Secondary | ICD-10-CM | POA: Diagnosis not present

## 2014-03-11 ENCOUNTER — Ambulatory Visit: Payer: Medicare Other | Admitting: Internal Medicine

## 2014-03-11 DIAGNOSIS — M1710 Unilateral primary osteoarthritis, unspecified knee: Secondary | ICD-10-CM | POA: Diagnosis not present

## 2014-03-18 ENCOUNTER — Ambulatory Visit (INDEPENDENT_AMBULATORY_CARE_PROVIDER_SITE_OTHER): Payer: Medicare Other | Admitting: Internal Medicine

## 2014-03-18 ENCOUNTER — Encounter: Payer: Self-pay | Admitting: Internal Medicine

## 2014-03-18 VITALS — BP 128/78 | HR 93 | Temp 98.5°F | Wt 145.4 lb

## 2014-03-18 DIAGNOSIS — I10 Essential (primary) hypertension: Secondary | ICD-10-CM | POA: Diagnosis not present

## 2014-03-18 DIAGNOSIS — J40 Bronchitis, not specified as acute or chronic: Secondary | ICD-10-CM | POA: Diagnosis not present

## 2014-03-18 DIAGNOSIS — M1990 Unspecified osteoarthritis, unspecified site: Secondary | ICD-10-CM

## 2014-03-18 MED ORDER — LOSARTAN POTASSIUM-HCTZ 100-25 MG PO TABS: 1.0000 | ORAL_TABLET | Freq: Every day | ORAL | Status: DC

## 2014-03-18 MED ORDER — AZITHROMYCIN 250 MG PO TABS: ORAL_TABLET | ORAL | Status: DC

## 2014-03-18 NOTE — Assessment & Plan Note (Signed)
Followup elsewhere, had knee  steroid shots that didn't work, currently having viscous injections but so far they are causing more pain. rec to  discuss with ortho.

## 2014-03-18 NOTE — Progress Notes (Signed)
Pre visit review using our clinic review tool, if applicable. No additional management support is needed unless otherwise documented below in the visit note. 

## 2014-03-18 NOTE — Assessment & Plan Note (Addendum)
Compliance to medications, SBPs in the 150s, 160s. She is having a lot of knee pain which may increase her BP. Plan: Discontinue fosinopril, start losartan HCT 100-25 1 tablet daily. Continue with clonidine 0.2 mg  3 times a day, carvedilol 12.5 twice a day. Continue monitoring BPs. BMP in 2 weeks Followup 2 months (Addendum, pharmacy called carvedilol 12.5 is not available, they have a 6.25 mg, i ok to prescribe a 6.25 two tablets by mouth twice a day as long as pt  Understands)

## 2014-03-18 NOTE — Progress Notes (Signed)
Subjective:    Patient ID: Alison Graves, female    DOB: [birth year], 80 y.o.   MRN: [id number]  DOS:  03/18/2014 Type of visit - description : f/u Interval history: HTN-- good med compliance, no s/e, amb BPs 150, 160, a couple of times in the 180s.  Also, has developed a cold for the last 5 days, Mostly cough with occasional green sputum. Her daughter had similar symptoms, she is recovering.  DJD, continue with significant pain in the knees, getting local injections  ROS Low-grade temperature with the onset of symptoms only, no chills. Mild sore throat. No sinus pain or congestion. No chest pain, difficulty breathing or lower extremity edema  Past Medical History  Diagnosis Date  . Hyperlipemia   . Hemorrhoids   . PMR (polymyalgia rheumatica)     ?  Marland Kitchen Osteoarthritis   . Microhematuria     s/p eval DR Patsi Sears 2004  . HTN (hypertension)     Past Surgical History  Procedure Laterality Date  . Laparoscopic hysterectomy      per Dr Lily Peer, ?oophorectomy  . Cataract extraction    . Breast biopsy  2005    benign    History   Social History  . Marital Status: Widowed    Spouse Name: N/A    Number of Children: 3  . Years of Education: N/A   Occupational History  . retired     Social History Main Topics  . Smoking status: Never Smoker   . Smokeless tobacco: Never Used  . Alcohol Use: No  . Drug Use: No  . Sexual Activity: Yes    Birth Control/ Protection: Surgical   Other Topics Concern  . Not on file   Social History Narrative   Widow,  Original from [place] , lives w/ her 3 daughters        Medication List       This list is accurate as of: 03/18/14 11:59 PM.  Always use your most recent med list.               acetaminophen 650 MG CR tablet  Commonly known as:  TYLENOL  Take 650 mg by mouth every 8 (eight) hours as needed.     aspirin 81 MG tablet  Take 81 mg by mouth daily.     azithromycin 250 MG tablet  Commonly known as:   ZITHROMAX Z-PAK  2 tabs the first day, then 1 tab a day     CALCIUM 600 + D PO  Take by mouth.     carvedilol 12.5 MG tablet  Commonly known as:  COREG  Take 1 tablet (12.5 mg total) by mouth 2 (two) times daily with a meal.     cloNIDine 0.2 MG tablet  Commonly known as:  CATAPRES  Take 1 tablet (0.2 mg total) by mouth 3 (three) times daily.     D3 ADULT PO  Take by mouth.     fish oil-omega-3 fatty acids 1000 MG capsule  Take 4 g by mouth daily.     losartan-hydrochlorothiazide 100-25 MG per tablet  Commonly known as:  HYZAAR  Take 1 tablet by mouth daily.     MULTIVITAMIN PO  Take 1 tablet by mouth daily.           Objective:   Physical Exam BP 128/78  Pulse 93  Temp(Src) 98.5 F (36.9 C) (Oral)  Wt 145 lb 6 oz (65.942 kg)  SpO2 97%  General -- alert,  well-developed, NAD.  HEENT-- Not pale. TMs normal, throat symmetric, no redness or discharge. Face symmetric, sinuses not tender to palpation. Nose slt congested.  Lungs -- normal respiratory effort, no intercostal retractions, no accessory muscle use, and few ronchi, no wheezing Heart-- normal rate, regular rhythm, no murmur.  Extremities-- no pitting pretibial edema bilaterally  Neurologic--  alert & oriented X3. Speech normal, gait appropriate for age and limited by DJD Psych-- Cognition and judgment appear intact. Cooperative with normal attention span and concentration. No anxious or depressed appearing.     Assessment & Plan:   Bronchitis, Rest, fluids, Tylenol, Z-Pak, robitussin. Call if not improving, see instructions

## 2014-03-18 NOTE — Patient Instructions (Signed)
Stop fosinopril  Start losartan HCT   Check the  blood pressure 2 or 3 times a  week be sure it is between 110/60 and 140/85. Ideal blood pressure is 120/80. If it is consistently higher or lower, let me know  Arrange for labs in 2-3 weeks: BMP dx HTN   Next visit --- 2 months    Rest, fluids , tylenol For cough, take robitussin  DM twice a day as needed  For congestion use OTC Nasocort: 2 nasal sprays on each side of the nose daily until you feel better Take the antibiotic as prescribed  (zithromax) Call if not gradually better over the next 3-4 days Call anytime if the symptoms are severe

## 2014-03-20 DIAGNOSIS — M1710 Unilateral primary osteoarthritis, unspecified knee: Secondary | ICD-10-CM | POA: Diagnosis not present

## 2014-04-03 ENCOUNTER — Telehealth: Payer: Self-pay

## 2014-04-03 ENCOUNTER — Telehealth: Payer: Self-pay | Admitting: Internal Medicine

## 2014-04-03 NOTE — Telephone Encounter (Signed)
Fax sent to pharmacy.  

## 2014-04-03 NOTE — Telephone Encounter (Signed)
Caller name: Ronda FairlyCarla Clayton Relation to pt: daughter Call back number:   [facility] [address]  Reason for call:   Patient daughter states that pharmacy told her that 12.5mg  carvedilol is no longer being manufactured. Please send alternative refill to pharmacy. Patient is out of this medication.

## 2014-04-03 NOTE — Telephone Encounter (Signed)
Opened by mistake.

## 2014-04-03 NOTE — Telephone Encounter (Signed)
Carvedilol 12.5 mg is no longer being manufactured (?) by pharmacy. Should I send rx for carvedilol 25 mg 1/2 tablet? Please advise.

## 2014-04-03 NOTE — Telephone Encounter (Signed)
We recommend  carvedilol extended-release 25 mg one by mouth daily

## 2014-04-03 NOTE — Telephone Encounter (Signed)
Pharmacy is now calling [phone] Rella Larvemmanuel - [facility]

## 2014-04-08 MED ORDER — CARVEDILOL 25 MG PO TABS: 25.0000 mg | ORAL_TABLET | Freq: Two times a day (BID) | ORAL | Status: DC

## 2014-04-08 NOTE — Addendum Note (Signed)
Addended by: Dorette GrateFAULKNER, Kieon Lawhorn C on: 04/08/2014 09:04 AM   Modules accepted: Orders

## 2014-04-08 NOTE — Telephone Encounter (Signed)
Medication resent to Pharmacy with new dosage.

## 2014-04-08 NOTE — Telephone Encounter (Signed)
Please call pharmacy. Patient is stating that pharmacy did not receive this.

## 2014-04-15 DIAGNOSIS — M170 Bilateral primary osteoarthritis of knee: Secondary | ICD-10-CM | POA: Diagnosis not present

## 2014-04-23 ENCOUNTER — Telehealth: Payer: Self-pay | Admitting: Internal Medicine

## 2014-04-23 ENCOUNTER — Other Ambulatory Visit: Payer: Self-pay

## 2014-04-23 DIAGNOSIS — I10 Essential (primary) hypertension: Secondary | ICD-10-CM

## 2014-04-23 NOTE — Telephone Encounter (Signed)
Letter printed and mailed to Pt.  

## 2014-04-23 NOTE — Telephone Encounter (Signed)
Due for a BMP, DX hypertension

## 2014-05-13 ENCOUNTER — Other Ambulatory Visit: Payer: Self-pay

## 2014-05-13 MED ORDER — LOSARTAN POTASSIUM-HCTZ 100-25 MG PO TABS: 1.0000 | ORAL_TABLET | Freq: Every day | ORAL | Status: DC

## 2014-05-21 ENCOUNTER — Encounter: Payer: Self-pay | Admitting: Internal Medicine

## 2014-06-11 ENCOUNTER — Ambulatory Visit (INDEPENDENT_AMBULATORY_CARE_PROVIDER_SITE_OTHER): Payer: Medicare Other | Admitting: Internal Medicine

## 2014-06-11 ENCOUNTER — Ambulatory Visit (INDEPENDENT_AMBULATORY_CARE_PROVIDER_SITE_OTHER): Payer: Medicare Other

## 2014-06-11 ENCOUNTER — Encounter: Payer: Self-pay | Admitting: Internal Medicine

## 2014-06-11 VITALS — BP 124/82 | HR 76 | Temp 98.2°F | Wt 146.0 lb

## 2014-06-11 DIAGNOSIS — Z23 Encounter for immunization: Secondary | ICD-10-CM | POA: Diagnosis not present

## 2014-06-11 DIAGNOSIS — I10 Essential (primary) hypertension: Secondary | ICD-10-CM

## 2014-06-11 LAB — BASIC METABOLIC PANEL
BUN: 18 mg/dL (ref 6–23)
CO2: 29 mEq/L (ref 19–32)
Calcium: 10.1 mg/dL (ref 8.4–10.5)
Chloride: 102 mEq/L (ref 96–112)
Creatinine, Ser: 0.8 mg/dL (ref 0.4–1.2)
GFR: 72.2 mL/min (ref >60.00)
GLUCOSE: 114 mg/dL — AB (ref 70–99)
Potassium: 4.4 mEq/L (ref 3.5–5.1)
Sodium: 138 mEq/L (ref 135–145)

## 2014-06-11 NOTE — Progress Notes (Signed)
Pre visit review using our clinic review tool, if applicable. No additional management support is needed unless otherwise documented below in the visit note. 

## 2014-06-11 NOTE — Assessment & Plan Note (Signed)
Hypertension, Ambulatory BPs range from 170s to 140s. Diastolic BP 80-90. Pulse in the 60s. Overall BP readings are trending down. Plan-- BMP Continue with losartan HCT Increase carvedilol 25 mg from 0.5 tablets twice a day to 1 tablet twice a day Continue monitoring BPs.

## 2014-06-11 NOTE — Patient Instructions (Signed)
Get your blood work before you leave  Carvedilol 25 mg: Take 1 tablet twice a day  Check the  blood pressure 2 or 3 times a  Week  Be sure your blood pressure is between  145/85  and 110/65.  if it is consistently higher or lower, let me know Also, be sure your pulse is not less than 55  Next visit in 3 months for a routine checkup

## 2014-06-11 NOTE — Progress Notes (Signed)
Subjective:    Patient ID: Alison Graves, female    DOB: 01/19/1932, 80 y.o.   MRN: [id number]  DOS:  06/11/2014 Type of visit - description : f/u Interval history: Hypertension, good compliance with medications, ambulatory BPs reviewed, see assessment and plan Complaining of dry mouth for few years, worse in the last few months. Needs a flu shot  ROS Denies chest pain, difficulty breathing. Lower extremity edema at baseline No nausea, vomiting, diarrhea. Occasionally has dry mouth  Past Medical History  Diagnosis Date  . Hyperlipemia   . Hemorrhoids   . PMR (polymyalgia rheumatica)     ?  Marland Kitchen. Osteoarthritis   . Microhematuria     s/p eval DR Patsi Searsannenbaum 2004  . HTN (hypertension)     Past Surgical History  Procedure Laterality Date  . Laparoscopic hysterectomy      per Dr Lily PeerFernandez, ?oophorectomy  . Cataract extraction    . Breast biopsy  2005    benign    History   Social History  . Marital Status: Widowed    Spouse Name: N/A    Number of Children: 3  . Years of Education: N/A   Occupational History  . retired     Social History Main Topics  . Smoking status: Never Smoker   . Smokeless tobacco: Never Used  . Alcohol Use: No  . Drug Use: No  . Sexual Activity: Yes    Birth Control/ Protection: Surgical   Other Topics Concern  . Not on file   Social History Narrative   Widow,  Original from [place] , lives w/ her 3 daughters        Medication List       This list is accurate as of: 06/11/14  1:56 PM.  Always use your most recent med list.               acetaminophen 650 MG CR tablet  Commonly known as:  TYLENOL  Take 650 mg by mouth every 8 (eight) hours as needed.     aspirin 81 MG tablet  Take 81 mg by mouth daily.     CALCIUM 600 + D PO  Take by mouth.     carvedilol 25 MG tablet  Commonly known as:  COREG  Take 1 tablet (25 mg total) by mouth 2 (two) times daily with a meal.     cloNIDine 0.2 MG tablet  Commonly known as:   CATAPRES  Take 1 tablet (0.2 mg total) by mouth 3 (three) times daily.     D3 ADULT PO  Take by mouth.     fish oil-omega-3 fatty acids 1000 MG capsule  Take 4 g by mouth daily.     losartan-hydrochlorothiazide 100-25 MG per tablet  Commonly known as:  HYZAAR  Take 1 tablet by mouth daily. DUE FOR APPT WITH DR Drue NovelPAZ [phone].     MULTIVITAMIN PO  Take 1 tablet by mouth daily.           Objective:   Physical Exam BP 124/82 mmHg  Pulse 76  Temp(Src) 98.2 F (36.8 C) (Oral)  Wt 146 lb (66.225 kg)  SpO2 96% General -- alert, well-developed, NAD.  HEENT--  oral membranes not particularly dry Lungs -- normal respiratory effort, no intercostal retractions, no accessory muscle use, and normal breath sounds.  Heart-- normal rate, regular rhythm, no murmur.  Extremities-- trace pretibial edema bilaterally  Neurologic--  alert & oriented X3.   Psych-- Cognition and judgment appear  intact. Cooperative with normal attention span and concentration. No anxious or depressed appearing.      Assessment & Plan:   Dry mouth Recommend OTC Orajel dry mouth gel.

## 2014-06-25 ENCOUNTER — Other Ambulatory Visit: Payer: Self-pay

## 2014-06-25 ENCOUNTER — Telehealth: Payer: Self-pay | Admitting: Internal Medicine

## 2014-06-25 MED ORDER — CARVEDILOL 25 MG PO TABS: 25.0000 mg | ORAL_TABLET | Freq: Two times a day (BID) | ORAL | Status: DC

## 2014-06-25 NOTE — Telephone Encounter (Signed)
Caller name: michelle morales Relation to pt: daughter Call back number: [phone] [facility] [address]  Reason for call:   Patient daughter states that the pharmacy does not have new updated rx for carvedilol. The pharmacy only gave patient half of the rx when she picked up yesterday

## 2014-06-25 NOTE — Telephone Encounter (Signed)
Informed patient daughter of this.  °

## 2014-06-25 NOTE — Telephone Encounter (Signed)
Carvedilol refilled with correct tablets, # 60 and 5 RF. [facility]

## 2014-07-07 ENCOUNTER — Other Ambulatory Visit: Payer: Self-pay

## 2014-07-07 MED ORDER — CLONIDINE HCL 0.2 MG PO TABS: 0.2000 mg | ORAL_TABLET | Freq: Three times a day (TID) | ORAL | Status: DC

## 2014-08-05 ENCOUNTER — Other Ambulatory Visit: Payer: Self-pay | Admitting: Internal Medicine

## 2014-09-10 ENCOUNTER — Ambulatory Visit: Payer: Medicare Other | Admitting: Internal Medicine

## 2014-10-01 ENCOUNTER — Encounter: Payer: Self-pay | Admitting: Internal Medicine

## 2014-10-01 ENCOUNTER — Ambulatory Visit (INDEPENDENT_AMBULATORY_CARE_PROVIDER_SITE_OTHER): Payer: Medicare Other | Admitting: Internal Medicine

## 2014-10-01 VITALS — BP 124/68 | HR 73 | Temp 98.2°F | Ht <= 58 in | Wt 147.4 lb

## 2014-10-01 DIAGNOSIS — M159 Polyosteoarthritis, unspecified: Secondary | ICD-10-CM

## 2014-10-01 DIAGNOSIS — M150 Primary generalized (osteo)arthritis: Secondary | ICD-10-CM | POA: Diagnosis not present

## 2014-10-01 DIAGNOSIS — I10 Essential (primary) hypertension: Secondary | ICD-10-CM

## 2014-10-01 DIAGNOSIS — M353 Polymyalgia rheumatica: Secondary | ICD-10-CM | POA: Diagnosis not present

## 2014-10-01 LAB — CBC WITH DIFFERENTIAL/PLATELET
BASOS PCT: 0.5 % (ref 0.0–3.0)
Basophils Absolute: 0 10*3/uL (ref 0.0–0.1)
EOS ABS: 0.3 10*3/uL (ref 0.0–0.7)
Eosinophils Relative: 4.1 % (ref 0.0–5.0)
HCT: 37.2 % (ref 36.0–46.0)
Hemoglobin: 12.7 g/dL (ref 12.0–15.0)
LYMPHS PCT: 34.6 % (ref 12.0–46.0)
Lymphs Abs: 2.3 10*3/uL (ref 0.7–4.0)
MCHC: 34 g/dL (ref 30.0–36.0)
MCV: 91.1 fl (ref 78.0–100.0)
MONO ABS: 0.4 10*3/uL (ref 0.1–1.0)
Monocytes Relative: 6.5 % (ref 3.0–12.0)
NEUTROS ABS: 3.5 10*3/uL (ref 1.4–7.7)
Neutrophils Relative %: 54.3 % (ref 43.0–77.0)
Platelets: 248 10*3/uL (ref 150.0–400.0)
RBC: 4.09 Mil/uL (ref 3.87–5.11)
RDW: 13.7 % (ref 11.5–15.5)
WBC: 6.5 10*3/uL (ref 4.0–10.5)

## 2014-10-01 LAB — HIGH SENSITIVITY CRP: CRP, High Sensitivity: 6.18 mg/L — ABNORMAL HIGH (ref 0.000–5.000)

## 2014-10-01 LAB — CK: CK TOTAL: 172 U/L (ref 7–177)

## 2014-10-01 LAB — SEDIMENTATION RATE: SED RATE: 32 mm/h — AB (ref 0–22)

## 2014-10-01 NOTE — Progress Notes (Signed)
Pre visit review using our clinic review tool, if applicable. No additional management support is needed unless otherwise documented below in the visit note. 

## 2014-10-01 NOTE — Patient Instructions (Signed)
Get your blood work before you leave   Tylenol  500 mg OTC 2 tabs a day every 8 hours as needed for pain  Continue checking your BPs, try to check daily, if the BP is more than 170 okay to take an extra clonidine tablet   Come back to the office in 3-4 months  for a physical exam

## 2014-10-01 NOTE — Assessment & Plan Note (Signed)
Good  Medication compliance, ambulatory BPs mostly 120, 140. this month had two readings of  170 and 190. Overall BPs well-controlled. Continue with carvedilol, losartan HCT and clonidine 3 times a day, okay to take an extra clonidine if BP is more than 170.

## 2014-10-01 NOTE — Progress Notes (Signed)
Subjective:    Patient ID: Alison Graves, female    DOB: 05-12-1932, 81 y.o.   MRN: [id number]  DOS:  10/01/2014 Type of visit - description : f/u Interval history: HTN, good compliance of medication, BP increased sometimes, she thinks related to diet and stress.  Also complaining of pain, all joints hurts on and off, they knees hurt all the time. The family is requesting a referral to Dr. Orlin Hilding a rheumatologist given her history of PMR   Review of Systems Has fever chills, headaches or weight loss No chest pain or difficulty breathing No nausea, vomiting, diarrhea. No myalgias per se  Past Medical History  Diagnosis Date  . Hyperlipemia   . Hemorrhoids   . PMR (polymyalgia rheumatica)     ?  Marland Kitchen Osteoarthritis   . Microhematuria     s/p eval DR Patsi Sears 2004  . HTN (hypertension)     Past Surgical History  Procedure Laterality Date  . Laparoscopic hysterectomy      per Dr Lily Peer, ?oophorectomy  . Cataract extraction    . Breast biopsy  2005    benign    History   Social History  . Marital Status: Widowed    Spouse Name: N/A  . Number of Children: 3  . Years of Education: N/A   Occupational History  . retired     Social History Main Topics  . Smoking status: Never Smoker   . Smokeless tobacco: Never Used  . Alcohol Use: No  . Drug Use: No  . Sexual Activity: Yes    Birth Control/ Protection: Surgical   Other Topics Concern  . Not on file   Social History Narrative   Widow,  Original from [place] , lives w/ her 3 daughters        Medication List       This list is accurate as of: 10/01/14 11:59 PM.  Always use your most recent med list.               acetaminophen 650 MG CR tablet  Commonly known as:  TYLENOL  Take 650 mg by mouth every 8 (eight) hours as needed.     aspirin 81 MG tablet  Take 81 mg by mouth daily.     CALCIUM 600 + D PO  Take by mouth.     carvedilol 25 MG tablet  Commonly known as:  COREG  Take 1  tablet (25 mg total) by mouth 2 (two) times daily with a meal.     cloNIDine 0.2 MG tablet  Commonly known as:  CATAPRES  Take 1 tablet (0.2 mg total) by mouth 3 (three) times daily.     D3 ADULT PO  Take by mouth.     fish oil-omega-3 fatty acids 1000 MG capsule  Take 4 g by mouth daily.     losartan-hydrochlorothiazide 100-25 MG per tablet  Commonly known as:  HYZAAR  Take 1 tablet by mouth once daily.     MULTIVITAMIN PO  Take 1 tablet by mouth daily.           Objective:   Physical Exam BP 124/68 mmHg  Pulse 73  Temp(Src) 98.2 F (36.8 C) (Oral)  Ht  (1.473 m)  Wt 147 lb 6 oz (66.849 kg)  BMI 30.81 kg/m2  SpO2 97% General:   Well developed, well nourished . NAD.  HEENT:  Normocephalic . Face symmetric, atraumatic Lungs:  CTA B Normal respiratory effort, no intercostal retractions,  no accessory muscle use. Heart: RRR,  no murmur.  Muscle skeletal: no pretibial edema bilaterally; normal pedal pulses B Hands and wrists without synovitis but with changes consistent with DJD Knees with significant bowing.  Skin: Not pale. Not jaundice Neurologic:  alert & oriented X3.  Speech normal, gait appropriate for age and unassisted Psych--  Cognition and judgment appear intact.  Cooperative with normal attention span and concentration.  Behavior appropriate. No anxious or depressed appearing.       Assessment & Plan:

## 2014-10-01 NOTE — Assessment & Plan Note (Signed)
Continue with generalized arthralgias, mostly at the knees. The patient's family is concerned about PMR and they request a referral to rheumatology Plan: Labs and [facility]. Continue Tylenol

## 2014-10-04 NOTE — Assessment & Plan Note (Signed)
Years ago, saw rheumatology (Dr Coral SpikesLevitin) , diagnosed with PMR with normal sedimentation rate, normal CRP but elevated CK. Responded to steroids

## 2014-12-02 ENCOUNTER — Other Ambulatory Visit: Payer: Self-pay | Admitting: Internal Medicine

## 2014-12-15 ENCOUNTER — Telehealth: Payer: Self-pay

## 2014-12-15 NOTE — Telephone Encounter (Signed)
LMOVM

## 2014-12-16 ENCOUNTER — Ambulatory Visit (INDEPENDENT_AMBULATORY_CARE_PROVIDER_SITE_OTHER): Payer: Medicare Other | Admitting: Internal Medicine

## 2014-12-16 ENCOUNTER — Encounter: Payer: Self-pay | Admitting: Internal Medicine

## 2014-12-16 ENCOUNTER — Ambulatory Visit (HOSPITAL_BASED_OUTPATIENT_CLINIC_OR_DEPARTMENT_OTHER)
Admission: RE | Admit: 2014-12-16 | Discharge: 2014-12-16 | Disposition: A | Discharge status: Home / Self Care | Payer: Medicare Other | Source: Ambulatory Visit | Attending: Internal Medicine | Admitting: Internal Medicine

## 2014-12-16 VITALS — BP 132/84 | HR 85 | Temp 97.8°F | Ht <= 58 in | Wt 153.0 lb

## 2014-12-16 DIAGNOSIS — R0602 Shortness of breath: Secondary | ICD-10-CM | POA: Diagnosis present

## 2014-12-16 DIAGNOSIS — R399 Unspecified symptoms and signs involving the genitourinary system: Secondary | ICD-10-CM

## 2014-12-16 DIAGNOSIS — I10 Essential (primary) hypertension: Secondary | ICD-10-CM

## 2014-12-16 DIAGNOSIS — I517 Cardiomegaly: Secondary | ICD-10-CM | POA: Diagnosis not present

## 2014-12-16 DIAGNOSIS — M159 Polyosteoarthritis, unspecified: Secondary | ICD-10-CM

## 2014-12-16 DIAGNOSIS — R635 Abnormal weight gain: Secondary | ICD-10-CM | POA: Diagnosis not present

## 2014-12-16 DIAGNOSIS — Z0000 Encounter for general adult medical examination without abnormal findings: Secondary | ICD-10-CM | POA: Diagnosis not present

## 2014-12-16 DIAGNOSIS — E785 Hyperlipidemia, unspecified: Secondary | ICD-10-CM | POA: Diagnosis not present

## 2014-12-16 DIAGNOSIS — M8580 Other specified disorders of bone density and structure, unspecified site: Secondary | ICD-10-CM

## 2014-12-16 DIAGNOSIS — M150 Primary generalized (osteo)arthritis: Secondary | ICD-10-CM

## 2014-12-16 MED ORDER — CARVEDILOL 25 MG PO TABS: 25.0000 mg | ORAL_TABLET | Freq: Two times a day (BID) | ORAL | Status: DC

## 2014-12-16 MED ORDER — LOSARTAN POTASSIUM-HCTZ 100-25 MG PO TABS: 1.0000 | ORAL_TABLET | Freq: Every day | ORAL | Status: DC

## 2014-12-16 MED ORDER — CLONIDINE HCL 0.2 MG PO TABS: 0.2000 mg | ORAL_TABLET | Freq: Three times a day (TID) | ORAL | Status: DC

## 2014-12-16 NOTE — Progress Notes (Signed)
Pre visit review using our clinic review tool, if applicable. No additional management support is needed unless otherwise documented below in the visit note. 

## 2014-12-16 NOTE — Assessment & Plan Note (Signed)
Td-- 2-11 pneumonia shot 2010 shingles shot 3-10 prevnar 2015  PAP: no further screening is recommended, see previous entries  Discontinue mammograms .  Cscope 2007 Dr Loreta Ave, Bx no adenomatous changes, no know  FH, we agreed no further screening Labs  Diet discussed, exercise limited by DJD

## 2014-12-16 NOTE — Patient Instructions (Signed)
Stop by the first floor and get the XR   Please schedule labs to be done within few days (fasting)   Please see the rheumatologist Dr. Lendon Colonel       Fall Prevention and Home Safety Falls cause injuries and can affect all age groups. It is possible to use preventive measures to significantly decrease the likelihood of falls. There are many simple measures which can make your home safer and prevent falls. OUTDOORS  Repair cracks and edges of walkways and driveways.  Remove high doorway thresholds.  Trim shrubbery on the main path into your home.  Have good outside lighting.  Clear walkways of tools, rocks, debris, and clutter.  Check that handrails are not broken and are securely fastened. Both sides of steps should have handrails.  Have leaves, snow, and ice cleared regularly.  Use sand or salt on walkways during winter months.  In the garage, clean up grease or oil spills. BATHROOM  Install night lights.  Install grab bars by the toilet and in the tub and shower.  Use non-skid mats or decals in the tub or shower.  Place a plastic non-slip stool in the shower to sit on, if needed.  Keep floors dry and clean up all water on the floor immediately.  Remove soap buildup in the tub or shower on a regular basis.  Secure bath mats with non-slip, double-sided rug tape.  Remove throw rugs and tripping hazards from the floors. BEDROOMS  Install night lights.  Make sure a bedside light is easy to reach.  Do not use oversized bedding.  Keep a telephone by your bedside.  Have a firm chair with side arms to use for getting dressed.  Remove throw rugs and tripping hazards from the floor. KITCHEN  Keep handles on pots and pans turned toward the center of the stove. Use back burners when possible.  Clean up spills quickly and allow time for drying.  Avoid walking on wet floors.  Avoid hot utensils and knives.  Position shelves so they are not too high or  low.  Place commonly used objects within easy reach.  If necessary, use a sturdy step stool with a grab bar when reaching.  Keep electrical cables out of the way.  Do not use floor polish or wax that makes floors slippery. If you must use wax, use non-skid floor wax.  Remove throw rugs and tripping hazards from the floor. STAIRWAYS  Never leave objects on stairs.  Place handrails on both sides of stairways and use them. Fix any loose handrails. Make sure handrails on both sides of the stairways are as long as the stairs.  Check carpeting to make sure it is firmly attached along stairs. Make repairs to worn or loose carpet promptly.  Avoid placing throw rugs at the top or bottom of stairways, or properly secure the rug with carpet tape to prevent slippage. Get rid of throw rugs, if possible.  Have an electrician put in a light switch at the top and bottom of the stairs. OTHER FALL PREVENTION TIPS  Wear low-heel or rubber-soled shoes that are supportive and fit well. Wear closed toe shoes.  When using a stepladder, make sure it is fully opened and both spreaders are firmly locked. Do not climb a closed stepladder.  Add color or contrast paint or tape to grab bars and handrails in your home. Place contrasting color strips on first and last steps.  Learn and use mobility aids as needed. Install an Lobbyist emergency  response system.  Turn on lights to avoid dark areas. Replace light bulbs that burn out immediately. Get light switches that glow.  Arrange furniture to create clear pathways. Keep furniture in the same place.  Firmly attach carpet with non-skid or double-sided tape.  Eliminate uneven floor surfaces.  Select a carpet pattern that does not visually hide the edge of steps.  Be aware of all pets. OTHER HOME SAFETY TIPS  Set the water temperature for 120 F (48.8 C).  Keep emergency numbers on or near the telephone.  Keep smoke detectors on every level of the  home and near sleeping areas. Document Released: 06/09/2002 Document Revised: 12/19/2011 Document Reviewed: 09/08/2011 [facility] Patient Information 2015 [place], [place]. This information is not intended to replace advice given to you by your health care provider. Make sure you discuss any questions you have with your health care provider.   Preventive Care for Adults Ages 4 and over  Blood pressure check.** / Every 1 to 2 years.  Lipid and cholesterol check.**/ Every 5 years beginning at age 58.  Lung cancer screening. / Every year if you are aged 34-80 years and have a 30-pack-year history of smoking and currently smoke or have quit within the past 15 years. Yearly screening is stopped once you have quit smoking for at least 15 years or develop a health problem that would prevent you from having lung cancer treatment.  Fecal occult blood test (FOBT) of stool. / Every year beginning at age 66 and continuing until age 72. You may not have to do this test if you get a colonoscopy every 10 years.  Flexible sigmoidoscopy** or colonoscopy.** / Every 5 years for a flexible sigmoidoscopy or every 10 years for a colonoscopy beginning at age 11 and continuing until age 74.  Hepatitis C blood test.** / For all people born from [birth year] through 1963 and any individual with known risks for hepatitis C.  Abdominal aortic aneurysm (AAA) screening.** / A one-time screening for ages 83 to 11 years who are current or former smokers.  Skin self-exam. / Monthly.  Influenza vaccine. / Every year.  Tetanus, diphtheria, and acellular pertussis (Tdap/Td) vaccine.** / 1 dose of Td every 10 years.  Varicella vaccine.** / Consult your health care provider.  Zoster vaccine.** / 1 dose for adults aged 59 years or older.  Pneumococcal 13-valent conjugate (PCV13) vaccine.** / Consult your health care provider.  Pneumococcal polysaccharide (PPSV23) vaccine.** / 1 dose for all adults aged 30 years and  older.  Meningococcal vaccine.** / Consult your health care provider.  Hepatitis A vaccine.** / Consult your health care provider.  Hepatitis B vaccine.** / Consult your health care provider.  Haemophilus influenzae type b (Hib) vaccine.** / Consult your health care provider. **Family history and personal history of risk and conditions may change your health care provider's recommendations. Document Released: 08/15/2001 Document Revised: 06/24/2013 Document Reviewed: 11/14/2010 [facility] Patient Information 2015 [place], [place]. This information is not intended to replace advice given to you by your health care provider. Make sure you discuss any questions you have with your health care provider.

## 2014-12-16 NOTE — Progress Notes (Signed)
Subjective:    Patient ID: Alison Graves, female    DOB: [birth year], 81 y.o.   MRN: [id number]  DOS:  12/16/2014 Type of visit - description :   Here for Medicare AWV: 1. Risk factors based on Past M, S, F history: reviewed 2. Physical Activities: sedentary, unable to walk much d/t knee pain   3. Depression/mood:  (-) screening  , occ anxiety "all life"  4. Hearing: No problemss noted or reported , no tinnitus 5. ADL's: independent, lives w/ her daughters, does not drive    6. Fall Risk: no recent falls, see AVS, prevention discussed 7. home Safety: does feel safe at home   8. Height, weight, &visual acuity: see VS, vision corrected. No recent vist, rec to schedule a visit 9. Counseling: provided 10. Labs ordered based on risk factors: if needed   11. Referral Coordination: if needed 12.  Care Plan, see assessment and plan   13.   Cognitive Assessment: motor skills limited by DJD, cognition appropriate  14. Team care updated -- does not see other MDs 15. End of life care discussed   In addition, today we discussed the following:   Hypertension, good compliance of medication, ambulatory BPs are checked frequently, usually ~130/80, 1 reading was in the 180s. Pulse consistently in the 60s DJD, question of PMR: Evolution by rheumatology pending Has noted increased weight lately as well as some difficulty breathing in the last 3-4 weeks. Not really edema or orthopnea.  Review of Systems Constitutional: No fever. No chills. No unusual sweats  HEENT: No dental problems, no ear discharge, no facial swelling, no voice changes. No eye discharge, no eye  redness , no  intolerance to light   Respiratory: No wheezing , no  difficulty breathing. Mild cough w/ mucus production  Cardiovascular: No CP, no leg swelling , no  Palpitations  GI: no nausea, no vomiting, no diarrhea , no  abdominal pain.  No blood in the stools. No dysphagia, no odynophagia    Endocrine: No polyphagia, no  polyuria , no polydipsia  GU:  + Urinary urgency and occasional frequency.  No dysuria, gross hematuria, difficulty urinating.   Musculoskeletal: No joint swellings or unusual aches or pains  Skin: No change in the color of the skin, palor , no  Rash  Allergic, immunologic: No environmental allergies , no  food allergies  Neurological: No dizziness no  syncope. No headaches. No diplopia, no slurred, no slurred speech, no motor deficits, no facial  Numbness  Hematological: No enlarged lymph nodes, no easy bruising , no unusual bleedings  Psychiatry: No suicidal ideas, no hallucinations, no beavior problems, no confusion.  History of anxiety all her life, denies need to take  Any  medication   Past Medical History  Diagnosis Date  . Hyperlipemia   . Hemorrhoids   . PMR (polymyalgia rheumatica)     ?  Marland Kitchen Osteoarthritis   . Microhematuria     s/p eval DR Patsi Sears 2004  . HTN (hypertension)     Past Surgical History  Procedure Laterality Date  . Laparoscopic hysterectomy      per Dr Lily Peer, ?oophorectomy  . Cataract extraction    . Breast biopsy  2005    benign    History   Social History  . Marital Status: Widowed    Spouse Name: N/A  . Number of Children: 3  . Years of Education: N/A   Occupational History  . retired     Chief Executive Officer  History Main Topics  . Smoking status: Never Smoker   . Smokeless tobacco: Never Used  . Alcohol Use: No  . Drug Use: No  . Sexual Activity: Yes    Birth Control/ Protection: Surgical   Other Topics Concern  . Not on file   Social History Narrative   Widow,  Original from [place] , lives w/ her 3 daughters     Family History  Problem Relation Age of Onset  . Diabetes Other     grandmother  . Coronary artery disease Mother     early in life   . Stroke Neg Hx   . Hypertension Neg Hx   . Cancer Neg Hx       Medication List       This list is accurate as of: 12/16/14 11:59 PM.  Always use your most recent med list.                 acetaminophen 650 MG CR tablet  Commonly known as:  TYLENOL  Take 650 mg by mouth every 8 (eight) hours as needed.     aspirin 81 MG tablet  Take 81 mg by mouth daily.     CALCIUM 600 + D PO  Take 3 tablets by mouth daily.     carvedilol 25 MG tablet  Commonly known as:  COREG  Take 1 tablet (25 mg total) by mouth 2 (two) times daily with a meal.     cloNIDine 0.2 MG tablet  Commonly known as:  CATAPRES  Take 1 tablet (0.2 mg total) by mouth 3 (three) times daily.     D3 ADULT PO  Take 3 tablets by mouth daily.     fish oil-omega-3 fatty acids 1000 MG capsule  Take 4 g by mouth daily.     losartan-hydrochlorothiazide 100-25 MG per tablet  Commonly known as:  HYZAAR  Take 1 tablet by mouth daily.     MULTIVITAMIN PO  Take 1 tablet by mouth daily.           Objective:   Physical Exam BP 132/84 mmHg  Pulse 85  Temp(Src) 97.8 F (36.6 C) (Oral)  Ht 4\' 10"  (1.473 m)  Wt 153 lb (69.4 kg)  BMI 31.99 kg/m2  SpO2 97%  General:   Well developed, well nourished . NAD.  Neck:  Full range of motion. Supple. No  thyromegaly , normal carotid pulse No JVD at 45 HEENT:  Normocephalic . Face symmetric, atraumatic Lungs:  CTA B Normal respiratory effort, no intercostal retractions, no accessory muscle use. Heart: RRR,  ? Mild syst  murmur.  +/+++ pretibial edema bilaterally  Abdomen:  Not distended, soft, non-tender. No rebound or rigidity.  Skin: Exposed areas without rash. Not pale. Not jaundice Neurologic:  alert & oriented X3.  Speech normal, gait very difficult due to DJD, unassisted Strength symmetric and appropriate for age.  Psych: Cognition and judgment appear intact.  Cooperative with normal attention span and concentration.  Behavior appropriate. No anxious or depressed appearing.       Assessment & Plan:     Weight gain, some shortness of breath Reports recent weight gain, she thinks may be from inactivity, on exam there is  trace edema bilaterally. We are checking a TSH, will also check a chest x-ray (CHF) Return to the office 2 months  Urinary frequency, will check a UA and urine culture

## 2014-12-17 NOTE — Assessment & Plan Note (Signed)
On no medications, check labs 

## 2014-12-17 NOTE — Assessment & Plan Note (Signed)
DJD, PMR? Was referred to rheumatology, evaluation pending.

## 2014-12-17 NOTE — Assessment & Plan Note (Signed)
Most of the readings very good, no change, check a BMP, a TSH

## 2014-12-17 NOTE — Assessment & Plan Note (Signed)
Last month density test 2- 2014, recheck a DEXA on return to the office

## 2014-12-18 ENCOUNTER — Other Ambulatory Visit (INDEPENDENT_AMBULATORY_CARE_PROVIDER_SITE_OTHER): Payer: Medicare Other

## 2014-12-18 DIAGNOSIS — R635 Abnormal weight gain: Secondary | ICD-10-CM

## 2014-12-18 DIAGNOSIS — E785 Hyperlipidemia, unspecified: Secondary | ICD-10-CM

## 2014-12-18 DIAGNOSIS — I10 Essential (primary) hypertension: Secondary | ICD-10-CM

## 2014-12-18 DIAGNOSIS — R3989 Other symptoms and signs involving the genitourinary system: Secondary | ICD-10-CM | POA: Diagnosis not present

## 2014-12-18 DIAGNOSIS — R399 Unspecified symptoms and signs involving the genitourinary system: Secondary | ICD-10-CM | POA: Diagnosis not present

## 2014-12-18 LAB — URINALYSIS, ROUTINE W REFLEX MICROSCOPIC
BILIRUBIN URINE: NEGATIVE
Ketones, ur: NEGATIVE
Nitrite: NEGATIVE
PH: 6.5 (ref 5.0–8.0)
Specific Gravity, Urine: 1.02 (ref 1.000–1.030)
TOTAL PROTEIN, URINE-UPE24: NEGATIVE
Urine Glucose: NEGATIVE
Urobilinogen, UA: 0.2 (ref 0.0–1.0)

## 2014-12-18 LAB — LIPID PANEL
Cholesterol: 219 mg/dL — ABNORMAL HIGH (ref 0–200)
HDL: 55.6 mg/dL (ref >39.00)
LDL CALC: 131 mg/dL — AB (ref 0–99)
NonHDL: 163.4
TRIGLYCERIDES: 163 mg/dL — AB (ref 0.0–149.0)
Total CHOL/HDL Ratio: 4
VLDL: 32.6 mg/dL (ref 0.0–40.0)

## 2014-12-18 LAB — BASIC METABOLIC PANEL
BUN: 22 mg/dL (ref 6–23)
CO2: 29 meq/L (ref 19–32)
Calcium: 10.2 mg/dL (ref 8.4–10.5)
Chloride: 101 mEq/L (ref 96–112)
Creatinine, Ser: 0.89 mg/dL (ref 0.40–1.20)
GFR: 64.68 mL/min (ref >60.00)
Glucose, Bld: 132 mg/dL — ABNORMAL HIGH (ref 70–99)
Potassium: 4.5 mEq/L (ref 3.5–5.1)
SODIUM: 137 meq/L (ref 135–145)

## 2014-12-18 LAB — CBC WITH DIFFERENTIAL/PLATELET
BASOS ABS: 0 10*3/uL (ref 0.0–0.1)
BASOS PCT: 0.5 % (ref 0.0–3.0)
EOS ABS: 0.2 10*3/uL (ref 0.0–0.7)
Eosinophils Relative: 3.6 % (ref 0.0–5.0)
HEMATOCRIT: 39.5 % (ref 36.0–46.0)
Hemoglobin: 13.2 g/dL (ref 12.0–15.0)
LYMPHS ABS: 2.3 10*3/uL (ref 0.7–4.0)
Lymphocytes Relative: 35.5 % (ref 12.0–46.0)
MCHC: 33.4 g/dL (ref 30.0–36.0)
MCV: 93.5 fl (ref 78.0–100.0)
Monocytes Absolute: 0.5 10*3/uL (ref 0.1–1.0)
Monocytes Relative: 7.2 % (ref 3.0–12.0)
NEUTROS ABS: 3.5 10*3/uL (ref 1.4–7.7)
Neutrophils Relative %: 53.2 % (ref 43.0–77.0)
Platelets: 259 10*3/uL (ref 150.0–400.0)
RBC: 4.22 Mil/uL (ref 3.87–5.11)
RDW: 14.1 % (ref 11.5–15.5)
WBC: 6.6 10*3/uL (ref 4.0–10.5)

## 2014-12-18 LAB — TSH: TSH: 2.17 u[IU]/mL (ref 0.35–4.50)

## 2014-12-19 LAB — URINE CULTURE: Colony Count: 80000

## 2014-12-28 ENCOUNTER — Encounter: Payer: Self-pay | Admitting: Internal Medicine

## 2014-12-28 ENCOUNTER — Ambulatory Visit (INDEPENDENT_AMBULATORY_CARE_PROVIDER_SITE_OTHER): Payer: Medicare Other | Admitting: Internal Medicine

## 2014-12-28 VITALS — BP 126/76 | HR 76 | Temp 98.1°F | Ht <= 58 in | Wt 150.4 lb

## 2014-12-28 DIAGNOSIS — J069 Acute upper respiratory infection, unspecified: Secondary | ICD-10-CM | POA: Diagnosis not present

## 2014-12-28 NOTE — Progress Notes (Signed)
Subjective:    Patient ID: Alison Graves, female    DOB: 12/07/1931, 81 y.o.   MRN: [id number]  DOS:  12/28/2014 Type of visit - description : Acute, here with her daughter who will be seen with same symptoms Interval history: 3-4 days history of sore throat, fatigue, runny nose, raspy voice. Also cough, initially with greenish sputum, now the sputum is yellow in color. Taking Robitussin OTC as needed  Review of Systems Denies fever chills No actual sinus pain or congestion. No difficulty breathing but some chest congestion. No nausea, vomiting, diarrhea. No unusual  aches or pains  Past Medical History  Diagnosis Date  . Hyperlipemia   . Hemorrhoids   . PMR (polymyalgia rheumatica)     ?  Marland Kitchen. Osteoarthritis   . Microhematuria     s/p eval DR Patsi Searsannenbaum 2004  . HTN (hypertension)     Past Surgical History  Procedure Laterality Date  . Laparoscopic hysterectomy      per Dr Lily PeerFernandez, ?oophorectomy  . Cataract extraction    . Breast biopsy  2005    benign    History   Social History  . Marital Status: Widowed    Spouse Name: N/A  . Number of Children: 3  . Years of Education: N/A   Occupational History  . retired     Social History Main Topics  . Smoking status: Never Smoker   . Smokeless tobacco: Never Used  . Alcohol Use: No  . Drug Use: No  . Sexual Activity: Yes    Birth Control/ Protection: Surgical   Other Topics Concern  . Not on file   Social History Narrative   Widow,  Original from [place] , lives w/ her 3 daughters        Medication List       This list is accurate as of: 12/28/14  5:39 PM.  Always use your most recent med list.               acetaminophen 650 MG CR tablet  Commonly known as:  TYLENOL  Take 650 mg by mouth every 8 (eight) hours as needed.     aspirin 81 MG tablet  Take 81 mg by mouth daily.     CALCIUM 600 + D PO  Take 3 tablets by mouth daily.     carvedilol 25 MG tablet  Commonly known as:  COREG  Take  1 tablet (25 mg total) by mouth 2 (two) times daily with a meal.     cloNIDine 0.2 MG tablet  Commonly known as:  CATAPRES  Take 1 tablet (0.2 mg total) by mouth 3 (three) times daily.     D3 ADULT PO  Take 3 tablets by mouth daily.     fish oil-omega-3 fatty acids 1000 MG capsule  Take 4 g by mouth daily.     losartan-hydrochlorothiazide 100-25 MG per tablet  Commonly known as:  HYZAAR  Take 1 tablet by mouth daily.     MULTIVITAMIN PO  Take 1 tablet by mouth daily.           Objective:   Physical Exam BP 126/76 mmHg  Pulse 76  Temp(Src) 98.1 F (36.7 C) (Oral)  Ht 4\' 10"  (1.473 m)  Wt 150 lb 6 oz (68.21 kg)  BMI 31.44 kg/m2  SpO2 95%  General:   Well developed, well nourished . NAD.  HEENT:  Normocephalic . Face symmetric, atraumatic TMs normal Throat symmetric, no discharge. Lungs:  Few rhonchi with cough but no crackles or wheezing. normal respiratory effort, no intercostal retractions, no accessory muscle use. Heart: RRR,  no murmur.  No pretibial edema bilaterally  Skin: Not pale. Not jaundice Neurologic:  alert & oriented X3.  Speech normal, gait appropriate for age and unassisted Psych--  Cognition and judgment appear intact.  Cooperative with normal attention span and concentration.  Behavior appropriate. No anxious or depressed appearing.       Assessment & Plan:     URI, conservative treatment, see  instructions

## 2014-12-28 NOTE — Progress Notes (Signed)
Pre visit review using our clinic review tool, if applicable. No additional management support is needed unless otherwise documented below in the visit note. 

## 2014-12-28 NOTE — Patient Instructions (Signed)
Rest, fluids , tylenol If  cough, take Robitussin DM  As needed  OTC Nasocort or Flonase : 2 nasal sprays on each side of the nose daily until you feel better   Call if not gradually better over the next  10 days Call anytime if the symptoms are severe

## 2015-02-02 DIAGNOSIS — H3531 Nonexudative age-related macular degeneration: Secondary | ICD-10-CM | POA: Diagnosis not present

## 2015-02-02 DIAGNOSIS — Z961 Presence of intraocular lens: Secondary | ICD-10-CM | POA: Diagnosis not present

## 2015-02-15 ENCOUNTER — Ambulatory Visit: Payer: Self-pay | Admitting: Internal Medicine

## 2015-02-17 ENCOUNTER — Encounter: Payer: Self-pay | Admitting: Internal Medicine

## 2015-02-17 ENCOUNTER — Ambulatory Visit (INDEPENDENT_AMBULATORY_CARE_PROVIDER_SITE_OTHER): Payer: Medicare Other | Admitting: Internal Medicine

## 2015-02-17 VITALS — BP 106/78 | HR 79 | Temp 98.0°F | Ht <= 58 in | Wt 150.5 lb

## 2015-02-17 DIAGNOSIS — M150 Primary generalized (osteo)arthritis: Secondary | ICD-10-CM | POA: Diagnosis not present

## 2015-02-17 DIAGNOSIS — Z13820 Encounter for screening for osteoporosis: Secondary | ICD-10-CM

## 2015-02-17 DIAGNOSIS — R609 Edema, unspecified: Secondary | ICD-10-CM | POA: Diagnosis not present

## 2015-02-17 DIAGNOSIS — Z780 Asymptomatic menopausal state: Secondary | ICD-10-CM

## 2015-02-17 DIAGNOSIS — M8580 Other specified disorders of bone density and structure, unspecified site: Secondary | ICD-10-CM

## 2015-02-17 DIAGNOSIS — I10 Essential (primary) hypertension: Secondary | ICD-10-CM | POA: Diagnosis not present

## 2015-02-17 DIAGNOSIS — M159 Polyosteoarthritis, unspecified: Secondary | ICD-10-CM

## 2015-02-17 DIAGNOSIS — M353 Polymyalgia rheumatica: Secondary | ICD-10-CM

## 2015-02-17 NOTE — Assessment & Plan Note (Signed)
See previous entries, history of PMR, continue with generalized joint aches likely due to DJD however she likes to see rheumatology. Used too see Dr. Coral Spikes 09-2014 sedimentation rate normal, C-reactive protein is slightly elevated at 6.1. Plan: Referral to rheumatology

## 2015-02-17 NOTE — Assessment & Plan Note (Signed)
Continue with mild peri-ankle edema, last TSH normal, last chest x-ray with no CHF type of changes except for cardiomegaly. Likely multifactorial, recommend avoid salt and leg elevation.

## 2015-02-17 NOTE — Assessment & Plan Note (Signed)
rx  a bone density test

## 2015-02-17 NOTE — Assessment & Plan Note (Addendum)
BP controlled in general, occasionally BP goes high or low, she remains asymptomatic. Recommend no change.

## 2015-02-17 NOTE — Progress Notes (Signed)
Subjective:    Patient ID: Alison Graves, female    DOB: 05-30-1932, 81 y.o.   MRN: [id number]  DOS:  02/17/2015 Type of visit - description : Follow-up Interval history: The last time she was here, she complained of weight gain and shortness of breath, chest x-ray showed cardiomegaly, TSH normal.  Wt Readings from Last 3 Encounters:  02/17/15 150 lb 8 oz (68.266 kg)  12/28/14 150 lb 6 oz (68.21 kg)  12/16/14 153 lb (69.4 kg)   History of DJD: Has not been able to see rheumatology Hypertension: Good compliance of medication, BPs in the 100-170 range but usually 120, 140.  She has been consistently asymptomatic with either blood pressures-- low or high .  Review of Systems Denies chest pain, mild shortness of breath at baseline Occasional nausea without vomiting, diarrhea or blood in the stools.   Past Medical History  Diagnosis Date  . Hyperlipemia   . Hemorrhoids   . PMR (polymyalgia rheumatica)     ?  Marland Kitchen Osteoarthritis   . Microhematuria     s/p eval DR Patsi Sears 2004  . HTN (hypertension)     Past Surgical History  Procedure Laterality Date  . Laparoscopic hysterectomy      per Dr Lily Peer, ?oophorectomy  . Cataract extraction    . Breast biopsy  2005    benign    Social History   Social History  . Marital Status: Widowed    Spouse Name: N/A  . Number of Children: 3  . Years of Education: N/A   Occupational History  . retired     Social History Main Topics  . Smoking status: Never Smoker   . Smokeless tobacco: Never Used  . Alcohol Use: No  . Drug Use: No  . Sexual Activity: Yes    Birth Control/ Protection: Surgical   Other Topics Concern  . Not on file   Social History Narrative   Widow,  Original from [place] , lives w/ her 3 daughters        Medication List       This list is accurate as of: 02/17/15 11:59 PM.  Always use your most recent med list.               acetaminophen 650 MG CR tablet  Commonly known as:  TYLENOL    Take 650 mg by mouth every 8 (eight) hours as needed.     aspirin 81 MG tablet  Take 81 mg by mouth daily.     CALCIUM 600 + D PO  Take 3 tablets by mouth daily.     carvedilol 25 MG tablet  Commonly known as:  COREG  Take 1 tablet (25 mg total) by mouth 2 (two) times daily with a meal.     cloNIDine 0.2 MG tablet  Commonly known as:  CATAPRES  Take 1 tablet (0.2 mg total) by mouth 3 (three) times daily.     D3 ADULT PO  Take 3 tablets by mouth daily.     fish oil-omega-3 fatty acids 1000 MG capsule  Take 4 g by mouth daily.     losartan-hydrochlorothiazide 100-25 MG per tablet  Commonly known as:  HYZAAR  Take 1 tablet by mouth daily.     MULTIVITAMIN PO  Take 1 tablet by mouth daily.           Objective:   Physical Exam BP 106/78 mmHg  Pulse 79  Temp(Src) 98 F (36.7 C) (Oral)  Ht   (1.473 m)  Wt 150 lb 8 oz (68.266 kg)  BMI 31.46 kg/m2  SpO2 97% General:   Well developed, well nourished . NAD.  HEENT:  Normocephalic . Face symmetric, atraumatic Lungs:  CTA B Normal respiratory effort, no intercostal retractions, no accessory muscle use. Heart: RRR,  no murmur.  Trace edema around the ankles   bilaterally  Skin: Not pale. Not jaundice Neurologic:  alert & oriented X3.  Speech normal, gait appropriate for age and unassisted Psych--  Cognition and judgment appear intact.  Cooperative with normal attention span and concentration.  Behavior appropriate. No anxious or depressed appearing.      Assessment & Plan:

## 2015-02-17 NOTE — Progress Notes (Signed)
Pre visit review using our clinic review tool, if applicable. No additional management support is needed unless otherwise documented below in the visit note. 

## 2015-04-09 ENCOUNTER — Other Ambulatory Visit: Payer: Self-pay

## 2015-04-09 DIAGNOSIS — Z1231 Encounter for screening mammogram for malignant neoplasm of breast: Secondary | ICD-10-CM

## 2015-04-19 ENCOUNTER — Ambulatory Visit
Admission: RE | Admit: 2015-04-19 | Discharge: 2015-04-19 | Disposition: A | Discharge status: Home / Self Care | Payer: Medicare Other | Source: Ambulatory Visit

## 2015-04-19 DIAGNOSIS — Z1231 Encounter for screening mammogram for malignant neoplasm of breast: Secondary | ICD-10-CM

## 2015-05-13 ENCOUNTER — Encounter: Payer: Self-pay | Admitting: Internal Medicine

## 2015-05-14 ENCOUNTER — Telehealth: Payer: Self-pay

## 2015-05-14 NOTE — Telephone Encounter (Signed)
-----   Message from Wanda PlumpJose E Paz, MD sent at 05/13/2015  7:22 PM EST ----- Regarding: Send a letter [place], you are due for a bone density test, I don't see that has been done, please call and schedule one.

## 2015-05-14 NOTE — Telephone Encounter (Signed)
Letter printed and mailed to Pt.  

## 2015-05-20 ENCOUNTER — Encounter: Payer: Self-pay | Admitting: Rheumatology

## 2015-06-10 ENCOUNTER — Telehealth: Payer: Self-pay | Admitting: Internal Medicine

## 2015-06-10 NOTE — Telephone Encounter (Signed)
LM to schedule flu shot or update records. °

## 2015-06-24 ENCOUNTER — Ambulatory Visit (INDEPENDENT_AMBULATORY_CARE_PROVIDER_SITE_OTHER): Payer: Medicare Other | Admitting: Family Medicine

## 2015-06-24 ENCOUNTER — Encounter: Payer: Self-pay | Admitting: Family Medicine

## 2015-06-24 VITALS — BP 189/91 | HR 79 | Temp 98.2°F | Resp 16 | Ht <= 58 in | Wt 152.0 lb

## 2015-06-24 DIAGNOSIS — IMO0001 Reserved for inherently not codable concepts without codable children: Secondary | ICD-10-CM

## 2015-06-24 DIAGNOSIS — J111 Influenza due to unidentified influenza virus with other respiratory manifestations: Secondary | ICD-10-CM

## 2015-06-24 DIAGNOSIS — R030 Elevated blood-pressure reading, without diagnosis of hypertension: Secondary | ICD-10-CM | POA: Diagnosis not present

## 2015-06-24 DIAGNOSIS — R69 Illness, unspecified: Principal | ICD-10-CM

## 2015-06-24 MED ORDER — OSELTAMIVIR PHOSPHATE 75 MG PO CAPS: ORAL_CAPSULE | ORAL | Status: DC

## 2015-06-24 NOTE — Patient Instructions (Signed)
Get otc generic robitussin DM OR Mucinex DM and use as directed on the packaging for cough and congestion. Use otc generic saline nasal spray 2-3 times per day to irrigate/moisturize your nasal passages.   

## 2015-06-24 NOTE — Progress Notes (Signed)
OFFICE VISIT  06/24/2015   CC:  Chief Complaint  Patient presents with  . Cough    x 2 days   . Headache    x 2 days   HPI:    Patient is a 81 y.o. Caucasian female who presents with her daughter (interpreter) for respiratory complaints. Onset of HA 2d/a, next day started coughing and aching in her body, subjective fever (thermometer working?? Per daugher--temp 100.8 yest).  Feeling some SOB.  Took coricidin HPB yesterday.  No n/v.  Mild umbillical region abd pains. No diarrhea.  No rash.  NO ST but some left sided anterior neck discomfort around submandibular region. +runny nose.  She forgot to take her bp med today.   Pt has not had a flu vaccine this season.  Past Medical History  Diagnosis Date  . Hyperlipemia   . Hemorrhoids   . PMR (polymyalgia rheumatica) (HCC)     ?  Marland Kitchen Osteoarthritis   . Microhematuria     s/p eval DR Patsi Sears 2004  . HTN (hypertension)     Past Surgical History  Procedure Laterality Date  . Laparoscopic hysterectomy      per Dr Lily Peer, ?oophorectomy  . Cataract extraction    . Breast biopsy  2005    benign    Outpatient Prescriptions Prior to Visit  Medication Sig Dispense Refill  . acetaminophen (TYLENOL) 650 MG CR tablet Take 650 mg by mouth every 8 (eight) hours as needed.      Marland Kitchen aspirin 81 MG tablet Take 81 mg by mouth daily.      . Calcium Carbonate-Vitamin D (CALCIUM 600 + D PO) Take 3 tablets by mouth daily.     . carvedilol (COREG) 25 MG tablet Take 1 tablet (25 mg total) by mouth 2 (two) times daily with a meal. 60 tablet 6  . Cholecalciferol (D3 ADULT PO) Take 3 tablets by mouth daily.     . cloNIDine (CATAPRES) 0.2 MG tablet Take 1 tablet (0.2 mg total) by mouth 3 (three) times daily. 90 tablet 6  . fish oil-omega-3 fatty acids 1000 MG capsule Take 4 g by mouth daily.     Marland Kitchen losartan-hydrochlorothiazide (HYZAAR) 100-25 MG per tablet Take 1 tablet by mouth daily. 30 tablet 6  . Multiple Vitamin (MULTIVITAMIN PO) Take 1  tablet by mouth daily.      No facility-administered medications prior to visit.    No Known Allergies  ROS As per HPI  PE: Blood pressure 189/91, pulse 79, temperature 98.2 F (36.8 C), temperature source Oral, resp. rate 16, height  (1.473 m), weight 152 lb (68.947 kg), SpO2 93 %. VS: noted--normal. Gen: alert, NAD, NONTOXIC APPEARING. HEENT: eyes without injection, drainage, or swelling.  Ears: EACs clear, TMs with normal light reflex and landmarks.  Nose: Clear rhinorrhea, with some dried, crusty exudate adherent to mildly injected mucosa.  No purulent d/c.  No paranasal sinus TTP.  No facial swelling.  Throat and mouth without focal lesion.  No pharyngial swelling, erythema, or exudate.   Neck: supple, no LAD.  No tenderness anywhere.  LUNGS: CTA bilat, nonlabored resps.  Good aeration, no tachypnea. CV: RRR, soft systolic ejection murmur noted, no r/g. EXT: no clubbing or cyanosis.  Trace bilat LE pitting edema. SKIN: no rash  LABS:  None today   Chemistry      Component Value Date/Time   NA 137 12/18/2014 1003   K 4.5 12/18/2014 1003   CL 101 12/18/2014 1003  CO2 29 12/18/2014 1003   BUN 22 12/18/2014 1003   CREATININE 0.89 12/18/2014 1003   CREATININE 0.72 08/09/2012 1552      Component Value Date/Time   CALCIUM 10.2 12/18/2014 1003   ALKPHOS 71 11/05/2013 0908   AST 25 11/05/2013 0908   ALT 18 11/05/2013 0908   BILITOT 0.3 11/05/2013 0908     IMPRESSION AND PLAN:  1) Influenza-like illness. Start tamiflu 75mg  bid x 5d with food. Push fluids. Get otc generic robitussin DM OR Mucinex DM and use as directed on the packaging for cough and congestion. Use otc generic saline nasal spray 2-3 times per day to irrigate/moisturize your nasal passages.  2) HTN, elevated bp in office today: she did not take her bp meds today. She was instructed to go home and take her bp meds and maintain good hydration as best she can.  An After Visit Summary was printed and  given to the patient.  FOLLOW UP: Return if symptoms worsen or fail to improve.

## 2015-06-24 NOTE — Progress Notes (Signed)
Pre visit review using our clinic review tool, if applicable. No additional management support is needed unless otherwise documented below in the visit note. 

## 2015-08-04 ENCOUNTER — Other Ambulatory Visit: Payer: Self-pay | Admitting: Internal Medicine

## 2015-08-20 ENCOUNTER — Ambulatory Visit: Payer: Medicare Other | Admitting: Internal Medicine

## 2015-09-03 ENCOUNTER — Other Ambulatory Visit: Payer: Self-pay | Admitting: Internal Medicine

## 2015-09-06 NOTE — Telephone Encounter (Signed)
Medication filled to pharmacy as requested.  Pt has an appt the end of this month.

## 2015-09-24 ENCOUNTER — Encounter: Payer: Self-pay | Admitting: Internal Medicine

## 2015-09-24 ENCOUNTER — Ambulatory Visit (INDEPENDENT_AMBULATORY_CARE_PROVIDER_SITE_OTHER): Payer: Medicare Other | Admitting: Internal Medicine

## 2015-09-24 VITALS — BP 116/76 | HR 76 | Temp 97.6°F | Ht <= 58 in | Wt 153.1 lb

## 2015-09-24 DIAGNOSIS — I10 Essential (primary) hypertension: Secondary | ICD-10-CM

## 2015-09-24 DIAGNOSIS — E119 Type 2 diabetes mellitus without complications: Secondary | ICD-10-CM | POA: Diagnosis not present

## 2015-09-24 DIAGNOSIS — M810 Age-related osteoporosis without current pathological fracture: Secondary | ICD-10-CM | POA: Diagnosis not present

## 2015-09-24 DIAGNOSIS — Z09 Encounter for follow-up examination after completed treatment for conditions other than malignant neoplasm: Secondary | ICD-10-CM

## 2015-09-24 LAB — HEMOGLOBIN A1C
HEMOGLOBIN A1C: 8.5 % — AB (ref <5.7)
Mean Plasma Glucose: 197 mg/dL — ABNORMAL HIGH (ref <117)

## 2015-09-24 NOTE — Progress Notes (Signed)
Pre visit review using our clinic review tool, if applicable. No additional management support is needed unless otherwise documented below in the visit note. 

## 2015-09-24 NOTE — Patient Instructions (Addendum)
GO TO THE LAB :      Get the blood work     GO TO THE FRONT DESK Schedule your next appointment for a  Yearly check up When?   By June 2017  Fasting?  Yes    We will schedule a bone density test

## 2015-09-24 NOTE — Progress Notes (Signed)
Subjective:    Patient ID: Alison Graves, female    DOB: 11/02/1931, 82 y.o.   MRN: [id number]  DOS:  09/24/2015 Type of visit - description : Routine visit, here with one of her daughters Interval history: HTN: Good compliance with medications, ambulatory BPs 120, 140. Very rarely has a systolic BP of 160. DJD, continue with on and off aches and pains at the knees and wrists.   Review of Systems  Denies fever chills or weight loss No headaches Past Medical History  Diagnosis Date  . Hyperlipemia   . Hemorrhoids   . PMR (polymyalgia rheumatica) (HCC)     ?  Marland Kitchen. Osteoarthritis   . Microhematuria     s/p eval DR Patsi Searsannenbaum 2004  . HTN (hypertension)     Past Surgical History  Procedure Laterality Date  . Laparoscopic hysterectomy      per Dr Lily PeerFernandez, ?oophorectomy  . Cataract extraction    . Breast biopsy  2005    benign    Social History   Social History  . Marital Status: Widowed    Spouse Name: N/A  . Number of Children: 3  . Years of Education: N/A   Occupational History  . retired     Social History Main Topics  . Smoking status: Never Smoker   . Smokeless tobacco: Never Used  . Alcohol Use: No  . Drug Use: No  . Sexual Activity: Yes    Birth Control/ Protection: Surgical   Other Topics Concern  . Not on file   Social History Narrative   Widow,  [place] from [place] , lives w/ her 3 daughters        Medication List       This list is accurate as of: 09/24/15  4:37 PM.  Always use your most recent med list.               acetaminophen 650 MG CR tablet  Commonly known as:  TYLENOL  Take 650 mg by mouth every 8 (eight) hours as needed.     aspirin 81 MG tablet  Take 81 mg by mouth daily.     CALCIUM 600 + D PO  Take 3 tablets by mouth daily.     carvedilol 25 MG tablet  Commonly known as:  COREG  Take 1 tablet (25 mg total) by mouth 2 (two) times daily with a meal.     cloNIDine 0.2 MG tablet  Commonly known as:  CATAPRES    Take 1 tablet (0.2 mg total) by mouth 3 (three) times daily.     fish oil-omega-3 fatty acids 1000 MG capsule  Take 4 g by mouth daily.     losartan-hydrochlorothiazide 100-25 MG tablet  Commonly known as:  HYZAAR  TAKE ONE TABLET BY MOUTH ONCE DAILY     MULTIVITAMIN PO  Take 1 tablet by mouth daily.           Objective:   Physical Exam BP 116/76 mmHg  Pulse 76  Temp(Src) 97.6 F (36.4 C) (Oral)  Ht 4\' 10"  (1.473 m)  Wt 153 lb 2 oz (69.457 kg)  BMI 32.01 kg/m2  SpO2 99% General:   Well developed, well nourished . NAD.  HEENT:  Normocephalic . Face symmetric, atraumatic Lungs:  CTA B Normal respiratory effort, no intercostal retractions, no accessory muscle use. Heart: RRR,  no murmur.  Minimal edema around the ankles bilaterally. Skin: Not pale. Not jaundice Neurologic:  alert & oriented X3.  Speech  normal, gait appropriate for age and unassisted Psych--  Cognition and judgment appear intact.  Cooperative with normal attention span and concentration.  Behavior appropriate. No anxious or depressed appearing.      Assessment & Plan:   Assessment HTN Hyperlipidemia LE edema  Microhematuria, urology eval 2004 Osteopenia DJD H/o PMR  Years ago, saw rheumatology (Dr Coral Spikes) , dx PMR with normal sed rate- CRP but elevated CK. Responded to steroids  Plan: HTN: Continue with carvedilol, clonidine and Hyzaar. Check a BMP Hyperglycemia: Check A1c RTC June 2017, CPX

## 2015-09-25 LAB — BASIC METABOLIC PANEL
BUN: 22 mg/dL (ref 7–25)
CALCIUM: 10.5 mg/dL — AB (ref 8.6–10.4)
CO2: 30 mmol/L (ref 20–31)
Chloride: 98 mmol/L (ref 98–110)
Creat: 0.94 mg/dL — ABNORMAL HIGH (ref 0.60–0.88)
GLUCOSE: 117 mg/dL — AB (ref 65–99)
Potassium: 4.2 mmol/L (ref 3.5–5.3)
SODIUM: 138 mmol/L (ref 135–146)

## 2015-09-26 DIAGNOSIS — Z09 Encounter for follow-up examination after completed treatment for conditions other than malignant neoplasm: Secondary | ICD-10-CM | POA: Insufficient documentation

## 2015-09-26 NOTE — Assessment & Plan Note (Signed)
HTN: Continue with carvedilol, clonidine and Hyzaar. Check a BMP Hyperglycemia: Check A1c RTC June 2017, CPX

## 2015-09-27 MED ORDER — METFORMIN HCL 500 MG PO TABS: 500.0000 mg | ORAL_TABLET | Freq: Two times a day (BID) | ORAL | Status: DC

## 2015-09-27 NOTE — Addendum Note (Signed)
Addended byConrad : Sherida Dobkins D on: 09/27/2015 11:07 AM   Modules accepted: Orders

## 2015-10-01 ENCOUNTER — Telehealth: Payer: Self-pay | Admitting: Internal Medicine

## 2015-10-01 NOTE — Telephone Encounter (Signed)
Caller name:Carla Relationship to patient:daughter Can be [phone] Pharmacy:  Reason for call:patient is returning your call

## 2015-10-01 NOTE — Telephone Encounter (Signed)
Daughter calling checking on the status of message below, advised patient computer system is down and CMA will follow up on Monday. Daughter states she's a teacher please leave detail message on her voicemail regarding instructions from PCP

## 2015-10-04 ENCOUNTER — Other Ambulatory Visit: Payer: Self-pay | Admitting: Internal Medicine

## 2015-10-04 NOTE — Telephone Encounter (Signed)
LMOM for Alison FellingCarla, Pt's daughter, at [phone], informing her of Pt's lab results and recommendations. Informed her I would mail them information regarding DM diet. Instructed her to call if questions or concerns.

## 2015-10-04 NOTE — Telephone Encounter (Signed)
Notes Recorded by Conrad BurlingtonKaylyn Durrell Barajas, CMA on 09/27/2015 at 1:56 PM [facility] informing Albin FellingCarla to return call regarding Pt's lab results. Notes Recorded by Conrad BurlingtonKaylyn Tykeshia Tourangeau, CMA on 09/27/2015 at 11:08 AM Rx sent. Notes Recorded by Wanda PlumpJose E Paz, MD on 09/27/2015 at 11:06 AM [facility], call metformin 500 mg one tablet twice a day #60 and 6 refills; call the patient's daughter me chills [phone] and discussed the plan (I already discussed it with the patient): Has mild diabetes, start metformin 500 mg one tablet twice a day, the first week take only one tablet with breakfast. Potential side effects include nausea, vomiting, diarrhea. Follow-up in few months as already scheduled  Notes Recorded by Wanda PlumpJose E Paz, MD on 09/27/2015 at 10:50 AM [facility]

## 2015-10-12 ENCOUNTER — Telehealth: Payer: Self-pay | Admitting: Internal Medicine

## 2015-10-12 DIAGNOSIS — E119 Type 2 diabetes mellitus without complications: Secondary | ICD-10-CM

## 2015-10-12 NOTE — Telephone Encounter (Signed)
LMOM informing Albin FellingCarla that okay per Dr. Drue NovelPaz to recheck Pt's A1c, informed her insurance may not pay. Instructed her to call and schedule lab appt for Pt.

## 2015-10-12 NOTE — Telephone Encounter (Signed)
Advise patient's daughter, will confirm the diagnosis of diabetes by repeating the A1c. Be aware insurance may not pay for it. DX DM

## 2015-10-12 NOTE — Telephone Encounter (Signed)
Caller name: Albin FellingCarla Relationship to patient: daughter Can be reached: [phone]   Reason for call: Pt is concerned about recent DX of diabetes. She said the day before labs she ate a lot of chocolate and wants to come tomorrow for repeat labs to recheck for diabetes. Please advise daughter.

## 2015-10-13 ENCOUNTER — Ambulatory Visit (INDEPENDENT_AMBULATORY_CARE_PROVIDER_SITE_OTHER)
Admission: RE | Admit: 2015-10-13 | Discharge: 2015-10-13 | Disposition: A | Discharge status: Home / Self Care | Payer: Medicare Other | Source: Ambulatory Visit | Attending: Internal Medicine | Admitting: Internal Medicine

## 2015-10-13 DIAGNOSIS — M810 Age-related osteoporosis without current pathological fracture: Secondary | ICD-10-CM | POA: Diagnosis not present

## 2015-12-04 ENCOUNTER — Other Ambulatory Visit: Payer: Self-pay | Admitting: Internal Medicine

## 2015-12-24 ENCOUNTER — Encounter: Payer: Medicare Other | Admitting: Internal Medicine

## 2015-12-28 ENCOUNTER — Telehealth: Payer: Self-pay | Admitting: Internal Medicine

## 2015-12-28 NOTE — Telephone Encounter (Signed)
Patient was No Show for CPE 6/23. Charge or No Charge?

## 2015-12-29 ENCOUNTER — Encounter: Payer: Self-pay | Admitting: Internal Medicine

## 2015-12-29 NOTE — Telephone Encounter (Signed)
No Charge, patient showed late, she has been re-scheduled.

## 2015-12-31 ENCOUNTER — Encounter: Payer: Self-pay | Admitting: Internal Medicine

## 2016-01-18 ENCOUNTER — Encounter: Payer: Self-pay | Admitting: Internal Medicine

## 2016-01-18 ENCOUNTER — Ambulatory Visit (INDEPENDENT_AMBULATORY_CARE_PROVIDER_SITE_OTHER): Payer: Medicare Other | Admitting: Internal Medicine

## 2016-01-18 VITALS — BP 122/70 | HR 77 | Temp 97.9°F | Ht <= 58 in | Wt 143.0 lb

## 2016-01-18 DIAGNOSIS — Z09 Encounter for follow-up examination after completed treatment for conditions other than malignant neoplasm: Secondary | ICD-10-CM | POA: Diagnosis not present

## 2016-01-18 DIAGNOSIS — E785 Hyperlipidemia, unspecified: Secondary | ICD-10-CM

## 2016-01-18 DIAGNOSIS — Z0000 Encounter for general adult medical examination without abnormal findings: Secondary | ICD-10-CM

## 2016-01-18 DIAGNOSIS — E118 Type 2 diabetes mellitus with unspecified complications: Secondary | ICD-10-CM

## 2016-01-18 DIAGNOSIS — M159 Polyosteoarthritis, unspecified: Secondary | ICD-10-CM

## 2016-01-18 NOTE — Patient Instructions (Signed)
Schedule labs to be done next week, fasting  Limit your  vitamin D to 1000 units a day  Next visit in 4 months to check your blood sugar  Please consider use a walker instead of a cane.     Fall Prevention and Home Safety Falls cause injuries and can affect all age groups. It is possible to use preventive measures to significantly decrease the likelihood of falls. There are many simple measures which can make your home safer and prevent falls. OUTDOORS  Repair cracks and edges of walkways and driveways.  Remove high doorway thresholds.  Trim shrubbery on the main path into your home.  Have good outside lighting.  Clear walkways of tools, rocks, debris, and clutter.  Check that handrails are not broken and are securely fastened. Both sides of steps should have handrails.  Have leaves, snow, and ice cleared regularly.  Use sand or salt on walkways during winter months.  In the garage, clean up grease or oil spills. BATHROOM  Install night lights.  Install grab bars by the toilet and in the tub and shower.  Use non-skid mats or decals in the tub or shower.  Place a plastic non-slip stool in the shower to sit on, if needed.  Keep floors dry and clean up all water on the floor immediately.  Remove soap buildup in the tub or shower on a regular basis.  Secure bath mats with non-slip, double-sided rug tape.  Remove throw rugs and tripping hazards from the floors. BEDROOMS  Install night lights.  Make sure a bedside light is easy to reach.  Do not use oversized bedding.  Keep a telephone by your bedside.  Have a firm chair with side arms to use for getting dressed.  Remove throw rugs and tripping hazards from the floor. KITCHEN  Keep handles on pots and pans turned toward the center of the stove. Use back burners when possible.  Clean up spills quickly and allow time for drying.  Avoid walking on wet floors.  Avoid hot utensils and knives.  Position  shelves so they are not too high or low.  Place commonly used objects within easy reach.  If necessary, use a sturdy step stool with a grab bar when reaching.  Keep electrical cables out of the way.  Do not use floor polish or wax that makes floors slippery. If you must use wax, use non-skid floor wax.  Remove throw rugs and tripping hazards from the floor. STAIRWAYS  Never leave objects on stairs.  Place handrails on both sides of stairways and use them. Fix any loose handrails. Make sure handrails on both sides of the stairways are as long as the stairs.  Check carpeting to make sure it is firmly attached along stairs. Make repairs to worn or loose carpet promptly.  Avoid placing throw rugs at the top or bottom of stairways, or properly secure the rug with carpet tape to prevent slippage. Get rid of throw rugs, if possible.  Have an electrician put in a light switch at the top and bottom of the stairs. OTHER FALL PREVENTION TIPS  Wear low-heel or rubber-soled shoes that are supportive and fit well. Wear closed toe shoes.  When using a stepladder, make sure it is fully opened and both spreaders are firmly locked. Do not climb a closed stepladder.  Add color or contrast paint or tape to grab bars and handrails in your home. Place contrasting color strips on first and last steps.  Learn and  use mobility aids as needed. Install an electrical emergency response system.  Turn on lights to avoid dark areas. Replace light bulbs that burn out immediately. Get light switches that glow.  Arrange furniture to create clear pathways. Keep furniture in the same place.  Firmly attach carpet with non-skid or double-sided tape.  Eliminate uneven floor surfaces.  Select a carpet pattern that does not visually hide the edge of steps.  Be aware of all pets. OTHER HOME SAFETY TIPS  Set the water temperature for 120 F (48.8 C).  Keep emergency numbers on or near the telephone.  Keep  smoke detectors on every level of the home and near sleeping areas. Document Released: 06/09/2002 Document Revised: 12/19/2011 Document Reviewed: 09/08/2011 [facility] Patient Information 2015 [place], [place]. This information is not intended to replace advice given to you by your health care provider. Make sure you discuss any questions you have with your health care provider.   Preventive Care for Adults Ages 17 and over  Blood pressure check.** / Every 1 to 2 years.  Lipid and cholesterol check.**/ Every 5 years beginning at age 50.  Lung cancer screening. / Every year if you are aged 55-80 years and have a 30-pack-year history of smoking and currently smoke or have quit within the past 15 years. Yearly screening is stopped once you have quit smoking for at least 15 years or develop a health problem that would prevent you from having lung cancer treatment.  Fecal occult blood test (FOBT) of stool. / Every year beginning at age 58 and continuing until age 22. You may not have to do this test if you get a colonoscopy every 10 years.  Flexible sigmoidoscopy** or colonoscopy.** / Every 5 years for a flexible sigmoidoscopy or every 10 years for a colonoscopy beginning at age 43 and continuing until age 49.  Hepatitis C blood test.** / For all people born from [birth year] through 1963 and any individual with known risks for hepatitis C.  Abdominal aortic aneurysm (AAA) screening.** / A one-time screening for ages 39 to 61 years who are current or former smokers.  Skin self-exam. / Monthly.  Influenza vaccine. / Every year.  Tetanus, diphtheria, and acellular pertussis (Tdap/Td) vaccine.** / 1 dose of Td every 10 years.  Varicella vaccine.** / Consult your health care provider.  Zoster vaccine.** / 1 dose for adults aged 36 years or older.  Pneumococcal 13-valent conjugate (PCV13) vaccine.** / Consult your health care provider.  Pneumococcal polysaccharide (PPSV23) vaccine.** / 1 dose for all  adults aged 38 years and older.  Meningococcal vaccine.** / Consult your health care provider.  Hepatitis A vaccine.** / Consult your health care provider.  Hepatitis B vaccine.** / Consult your health care provider.  Haemophilus influenzae type b (Hib) vaccine.** / Consult your health care provider. **Family history and personal history of risk and conditions may change your health care provider's recommendations. Document Released: 08/15/2001 Document Revised: 06/24/2013 Document Reviewed: 11/14/2010 [facility] Patient Information 2015 [place], [place]. This information is not intended to replace advice given to you by your health care provider. Make sure you discuss any questions you have with your health care provider.

## 2016-01-18 NOTE — Progress Notes (Signed)
Pre visit review using our clinic review tool, if applicable. No additional management support is needed unless otherwise documented below in the visit note. 

## 2016-01-18 NOTE — Progress Notes (Signed)
Subjective:    Patient ID: Alison Graves, female    DOB: [birth year], 82 y.o.   MRN: [id number]  DOS:  01/18/2016: Type of visit - description :    Here for Medicare AWV: 1. Risk factors based on Past M, S, F history: reviewed 2. Physical Activities: sedentary, unable to walk much d/t knee pain   3. Depression/mood:  (-) screening  , occ anxiety "all life"   4. Hearing: No problemss noted or reported , no tinnitus 5. ADL's: independent, lives w/ her daughters, does not drive    6. Fall Risk: no recent falls, see AVS, prevention discussed 7. home Safety: does feel safe at home   8. Height, weight, &visual acuity: see VS, vision corrected, last visit ~ 1 year, rec to check w/ them for a f/u 9. Counseling: provided 10. Labs ordered based on risk factors: if needed   11. Referral Coordination: if needed 12.  Care Plan, see assessment and plan   13.   Cognitive Assessment: motor skills limited by DJD, cognition appropriate   14. Team care updated -- Dr Dione Booze 15. End of life care discussed , states has a HC POA  In addition, today we discussed the following: HTN: Good compliance of medication, ambulatory BPs within normal DM: Started metformin, no apparent side effects, no CBGs. Taking multiple vitamin D supplements a day DJD: Ongoing pain, mostly at the knees, likes a referral    Review of Systems Constitutional: No fever. No chills. No unexplained wt changes. No unusual sweats  HEENT: No dental problems, no ear discharge, no facial swelling, no voice changes. Occasional eye tearing, not a new symptom no eye  redness , no  intolerance to light   Respiratory: No wheezing , no  difficulty breathing. No cough , no mucus production  Cardiovascular: No CP, bilateral ankle swelling at baseline , no  Palpitations  GI: no nausea, no vomiting, no diarrhea , no  abdominal pain.  No blood in the stools. No dysphagia, no odynophagia    Endocrine: No polyphagia, no polyuria , no  polydipsia  GU: No dysuria, gross hematuria, difficulty urinating. + Urinary urgency and occasional leaking, not a new symptom:.  Musculoskeletal: History of DJD  Skin: No change in the color of the skin, palor , no  Rash  Allergic, immunologic: No environmental allergies , no  food allergies  Neurological: No dizziness no  syncope. No headaches. No diplopia, no slurred, no slurred speech, no motor deficits, no facial  Numbness  Hematological: No enlarged lymph nodes, no easy bruising , no unusual bleedings  Psychiatry: No suicidal ideas, no hallucinations, no beavior problems, no confusion.  No unusual/severe anxiety, no depression   Past Medical History  Diagnosis Date  . Hyperlipemia   . Hemorrhoids   . PMR (polymyalgia rheumatica) (HCC)     ?  Marland Kitchen Osteoarthritis   . Microhematuria     s/p eval DR Patsi Sears 2004  . HTN (hypertension)     Past Surgical History  Procedure Laterality Date  . Laparoscopic hysterectomy      per Dr Lily Peer, ?oophorectomy  . Cataract extraction    . Breast biopsy  2005    benign    Social History   Social History  . Marital Status: Widowed    Spouse Name: N/A  . Number of Children: 3  . Years of Education: N/A   Occupational History  . retired     Social History Main Topics  . Smoking status:  Never Smoker   . Smokeless tobacco: Never Used  . Alcohol Use: No  . Drug Use: No  . Sexual Activity: Yes    Birth Control/ Protection: Surgical   Other Topics Concern  . Not on file   Social History Narrative   Widow,  Original from [place] , lives w/ 2 of her 3 daughters     Family History  Problem Relation Age of Onset  . Diabetes Other     grandmother  . Coronary artery disease Mother     early in life   . Stroke Neg Hx   . Hypertension Neg Hx   . Cancer Neg Hx        Medication List       This list is accurate as of: 01/18/16 11:59 PM.  Always use your most recent med list.               acetaminophen 650  MG CR tablet  Commonly known as:  TYLENOL  Take 650 mg by mouth every 8 (eight) hours as needed. Reported on 01/18/2016     aspirin 81 MG tablet  Take 81 mg by mouth daily.     CALCIUM 600 + D PO  Take 3 tablets by mouth daily.     carvedilol 25 MG tablet  Commonly known as:  COREG  Take 1 tablet (25 mg total) by mouth 2 (two) times daily with a meal.     cloNIDine 0.2 MG tablet  Commonly known as:  CATAPRES  Take 1 tablet (0.2 mg total) by mouth 3 (three) times daily.     fish oil-omega-3 fatty acids 1000 MG capsule  Take 4 g by mouth daily. Reported on 01/18/2016     losartan-hydrochlorothiazide 100-25 MG tablet  Commonly known as:  HYZAAR  Take 1 tablet by mouth daily.     metFORMIN 500 MG tablet  Commonly known as:  GLUCOPHAGE  Take 1 tablet (500 mg total) by mouth 2 (two) times daily with a meal.     MULTIVITAMIN PO  Take 1 tablet by mouth daily.     Vitamin D3 5000 units Tabs  Take 4 tablets by mouth daily.           Objective:   Physical Exam BP 122/70 mmHg  Pulse 77  Temp(Src) 97.9 F (36.6 C) (Oral)  Ht  (1.473 m)  Wt 143 lb (64.864 kg)  BMI 29.89 kg/m2  SpO2 98%  General:   Well developed, well nourished . NAD.  Neck: No  thyromegaly  HEENT:  Normocephalic . Face symmetric, atraumatic Lungs:  CTA B Normal respiratory effort, no intercostal retractions, no accessory muscle use. Heart: RRR,  no murmur.  No pretibial edema bilaterally  Abdomen:  Not distended, soft, non-tender. No rebound or rigidity.   Skin: Exposed areas without rash. Not pale. Not jaundice MSK: Changes consistent with DJD without synovitis throughout Neurologic:  alert & oriented X3.  Speech normal, gait : Seems unsteady even though she is assisted by a cane   Strength symmetric and appropriate for age.  Psych: Cognition and judgment appear intact.  Cooperative with normal attention span and concentration.  Behavior appropriate. No anxious or depressed appearing.     Assessment & Plan:   Assessment DM HTN Hyperlipidemia LE edema  Microhematuria, urology eval 2004 Osteopenia-- t score -1.1 2011, t score normal 10-2015 DJD - knees H/o PMR  Years ago, saw rheumatology (Dr Coral Spikes) , dx PMR with normal sed rate- CRP  but elevated CK. Responded to steroids   PLAN: DM: A1c was 8.5 09-2015, was Rx metformin, good compliance and tolerance. Check labs HTN: Controlled, continue carvedilol, Hyzaar, Catapres. Hyperlipidemia: On no medications, checking labs DJD: Ongoing problem, on Tylenol only, strongly request to see rheumatology Dr. Victory Dakinavenshwar, referral done (although I'm not sure if she could do much more for her as pain is from DJD) Taking multiple vitamin D supplements daily, recommend not to exceed thousand units daily RTC 4 months Labs: BMP, AST, ALT, A1c, FLP.

## 2016-01-18 NOTE — Assessment & Plan Note (Addendum)
Td-- 2-11; pneumonia shot 2010; prevnar 2015  ; shingles shot 3-10  No further  MMG-PAP --see previous entries    Cscope 2007 Dr Loreta AveMann, Bx no adenomatous changes, no know  FH, we agreed no further screening Diet discussed, exercise limited by DJD   Gait unsteady, encourage to upgrade to a walker, she is reluctant, declined a PT referral

## 2016-01-19 NOTE — Assessment & Plan Note (Signed)
DM: A1c was 8.5 09-2015, was Rx metformin, good compliance and tolerance. Check labs HTN: Controlled, continue carvedilol, Hyzaar, Catapres. Hyperlipidemia: On no medications, checking labs DJD: Ongoing problem, on Tylenol only, strongly request to see rheumatology Dr. Victory Dakinavenshwar, referral done (although I'm not sure if she could do much more for her as pain is from DJD) Taking multiple vitamin D supplements daily, recommend not to exceed thousand units daily RTC 4 months Labs: BMP, AST, ALT, A1c, FLP.

## 2016-01-24 ENCOUNTER — Telehealth: Payer: Self-pay | Admitting: *Deleted

## 2016-01-24 NOTE — Telephone Encounter (Signed)
[employer] note received via fax  Call:   Date: 01/23/16 Time: 1134   Caller: Lorna Few, daughter Return number: [phone]  Nurse: Jarrett Soho, RN  Chief Complaint: OVERDOSE took too much medication at once  Reason for call: Caller states mother took her meds at 10:30 and took them again half an hour later. BP and anxiety meds she took clonidine, carvedilol.  Related visit to physician within the last 2 weeks: No  Guideline: Poisoning; All other potentially harmful substances (e.g., nearly all chemicals, plants, more than a double dose of a drug)  Disposition: Call Poison Center Now    **Called pt's daughter, Windell Moulding, at number listed above to follow-up and left message to return call.**

## 2016-01-24 NOTE — Telephone Encounter (Signed)
Spoke w/ pt's daughter, Windell Moulding. She reports she called poison control as directed and her mother had no adverse effects from overdose of medication and she is currently in her normal state of health and doing well. No further needs from our office at this time.

## 2016-01-28 ENCOUNTER — Other Ambulatory Visit (INDEPENDENT_AMBULATORY_CARE_PROVIDER_SITE_OTHER): Payer: Medicare Other

## 2016-01-28 ENCOUNTER — Other Ambulatory Visit: Payer: Medicare Other

## 2016-01-28 DIAGNOSIS — E118 Type 2 diabetes mellitus with unspecified complications: Principal | ICD-10-CM

## 2016-01-28 DIAGNOSIS — E1165 Type 2 diabetes mellitus with hyperglycemia: Secondary | ICD-10-CM

## 2016-01-28 DIAGNOSIS — E785 Hyperlipidemia, unspecified: Secondary | ICD-10-CM | POA: Diagnosis not present

## 2016-01-28 LAB — BASIC METABOLIC PANEL
BUN: 21 mg/dL (ref 6–23)
CALCIUM: 10.1 mg/dL (ref 8.4–10.5)
CHLORIDE: 102 meq/L (ref 96–112)
CO2: 32 meq/L (ref 19–32)
Creatinine, Ser: 0.88 mg/dL (ref 0.40–1.20)
GFR: 65.35 mL/min (ref >60.00)
Glucose, Bld: 134 mg/dL — ABNORMAL HIGH (ref 70–99)
POTASSIUM: 4.3 meq/L (ref 3.5–5.1)
SODIUM: 139 meq/L (ref 135–145)

## 2016-01-28 LAB — LIPID PANEL
CHOLESTEROL: 211 mg/dL — AB (ref 0–200)
HDL: 58.2 mg/dL (ref >39.00)
LDL Cholesterol: 122 mg/dL — ABNORMAL HIGH (ref 0–99)
NonHDL: 152.53
TRIGLYCERIDES: 155 mg/dL — AB (ref 0.0–149.0)
Total CHOL/HDL Ratio: 4
VLDL: 31 mg/dL (ref 0.0–40.0)

## 2016-01-28 LAB — HEMOGLOBIN A1C
HEMOGLOBIN A1C: 7 % — AB (ref <5.7)
Mean Plasma Glucose: 154 mg/dL

## 2016-01-28 LAB — ALT: ALT: 20 U/L (ref 0–35)

## 2016-01-28 LAB — AST: AST: 21 U/L (ref 0–37)

## 2016-01-31 ENCOUNTER — Emergency Department (HOSPITAL_COMMUNITY): Payer: Medicare Other

## 2016-01-31 ENCOUNTER — Emergency Department (HOSPITAL_COMMUNITY)
Admission: EM | Admit: 2016-01-31 | Discharge: 2016-02-01 | Disposition: A | Discharge status: Home / Self Care | Payer: Medicare Other | Attending: Emergency Medicine | Admitting: Emergency Medicine

## 2016-01-31 DIAGNOSIS — S8991XA Unspecified injury of right lower leg, initial encounter: Secondary | ICD-10-CM | POA: Diagnosis not present

## 2016-01-31 DIAGNOSIS — E785 Hyperlipidemia, unspecified: Secondary | ICD-10-CM | POA: Diagnosis not present

## 2016-01-31 DIAGNOSIS — S199XXA Unspecified injury of neck, initial encounter: Secondary | ICD-10-CM | POA: Diagnosis not present

## 2016-01-31 DIAGNOSIS — I10 Essential (primary) hypertension: Secondary | ICD-10-CM | POA: Insufficient documentation

## 2016-01-31 DIAGNOSIS — Y929 Unspecified place or not applicable: Secondary | ICD-10-CM | POA: Insufficient documentation

## 2016-01-31 DIAGNOSIS — Z7984 Long term (current) use of oral hypoglycemic drugs: Secondary | ICD-10-CM | POA: Diagnosis not present

## 2016-01-31 DIAGNOSIS — Z7982 Long term (current) use of aspirin: Secondary | ICD-10-CM | POA: Insufficient documentation

## 2016-01-31 DIAGNOSIS — S60229A Contusion of unspecified hand, initial encounter: Secondary | ICD-10-CM | POA: Insufficient documentation

## 2016-01-31 DIAGNOSIS — W19XXXA Unspecified fall, initial encounter: Secondary | ICD-10-CM

## 2016-01-31 DIAGNOSIS — W108XXA Fall (on) (from) other stairs and steps, initial encounter: Secondary | ICD-10-CM | POA: Diagnosis not present

## 2016-01-31 DIAGNOSIS — S60811A Abrasion of right wrist, initial encounter: Secondary | ICD-10-CM | POA: Diagnosis not present

## 2016-01-31 DIAGNOSIS — S299XXA Unspecified injury of thorax, initial encounter: Secondary | ICD-10-CM | POA: Diagnosis not present

## 2016-01-31 DIAGNOSIS — S2001XA Contusion of right breast, initial encounter: Secondary | ICD-10-CM | POA: Insufficient documentation

## 2016-01-31 DIAGNOSIS — S8000XA Contusion of unspecified knee, initial encounter: Secondary | ICD-10-CM | POA: Diagnosis not present

## 2016-01-31 DIAGNOSIS — Y939 Activity, unspecified: Secondary | ICD-10-CM | POA: Diagnosis not present

## 2016-01-31 DIAGNOSIS — S60222A Contusion of left hand, initial encounter: Secondary | ICD-10-CM | POA: Diagnosis not present

## 2016-01-31 DIAGNOSIS — Y999 Unspecified external cause status: Secondary | ICD-10-CM | POA: Diagnosis not present

## 2016-01-31 DIAGNOSIS — M25531 Pain in right wrist: Secondary | ICD-10-CM | POA: Diagnosis not present

## 2016-01-31 DIAGNOSIS — Y92009 Unspecified place in unspecified non-institutional (private) residence as the place of occurrence of the external cause: Secondary | ICD-10-CM

## 2016-01-31 DIAGNOSIS — S0990XA Unspecified injury of head, initial encounter: Secondary | ICD-10-CM | POA: Diagnosis present

## 2016-01-31 DIAGNOSIS — M25561 Pain in right knee: Secondary | ICD-10-CM | POA: Diagnosis not present

## 2016-01-31 DIAGNOSIS — S0083XA Contusion of other part of head, initial encounter: Secondary | ICD-10-CM | POA: Insufficient documentation

## 2016-01-31 DIAGNOSIS — S8001XA Contusion of right knee, initial encounter: Secondary | ICD-10-CM | POA: Diagnosis not present

## 2016-01-31 DIAGNOSIS — S60221A Contusion of right hand, initial encounter: Secondary | ICD-10-CM | POA: Diagnosis not present

## 2016-01-31 DIAGNOSIS — S8002XA Contusion of left knee, initial encounter: Secondary | ICD-10-CM | POA: Diagnosis not present

## 2016-01-31 NOTE — ED Notes (Signed)
Bed: [facility] Expected date:  Expected time:  Means of arrival:  Comments: EMS 

## 2016-01-31 NOTE — ED Triage Notes (Signed)
Pt states that she lost her footing tonight and fell on the brick stairs, into the flower bed. R wrist pain and swelling, Abrasions to her R breast and a hematoma on her R forehead. Denies LOC. Pt is taking an aspirin. Alert and oriented.

## 2016-02-01 ENCOUNTER — Telehealth: Payer: Self-pay | Admitting: Behavioral Health

## 2016-02-01 ENCOUNTER — Emergency Department (HOSPITAL_COMMUNITY): Payer: Medicare Other

## 2016-02-01 DIAGNOSIS — S199XXA Unspecified injury of neck, initial encounter: Secondary | ICD-10-CM | POA: Diagnosis not present

## 2016-02-01 DIAGNOSIS — S8002XA Contusion of left knee, initial encounter: Secondary | ICD-10-CM | POA: Diagnosis not present

## 2016-02-01 DIAGNOSIS — S299XXA Unspecified injury of thorax, initial encounter: Secondary | ICD-10-CM | POA: Diagnosis not present

## 2016-02-01 DIAGNOSIS — S60222A Contusion of left hand, initial encounter: Secondary | ICD-10-CM | POA: Diagnosis not present

## 2016-02-01 DIAGNOSIS — S0083XA Contusion of other part of head, initial encounter: Secondary | ICD-10-CM | POA: Diagnosis not present

## 2016-02-01 DIAGNOSIS — M25561 Pain in right knee: Secondary | ICD-10-CM | POA: Diagnosis not present

## 2016-02-01 DIAGNOSIS — S8991XA Unspecified injury of right lower leg, initial encounter: Secondary | ICD-10-CM | POA: Diagnosis not present

## 2016-02-01 DIAGNOSIS — S0990XA Unspecified injury of head, initial encounter: Secondary | ICD-10-CM | POA: Diagnosis not present

## 2016-02-01 DIAGNOSIS — S2001XA Contusion of right breast, initial encounter: Secondary | ICD-10-CM | POA: Diagnosis not present

## 2016-02-01 MED ORDER — TRAMADOL HCL 50 MG PO TABS: 50.0000 mg | ORAL_TABLET | Freq: Four times a day (QID) | ORAL | 0 refills | Status: DC | PRN

## 2016-02-01 NOTE — ED Notes (Signed)
Patient transported to X-ray 

## 2016-02-01 NOTE — Telephone Encounter (Signed)
[employer] note received via fax  Call Date: 01/31/16 Time: 8:14 PM   Caller: Lorna Few (Patient's daughter) Return number: [phone]  Nurse: Adrian Prince, RN  Chief Complaint: Bruise  Reason for call: Caller states her mother fell sideways on stairs into a flower bed. There are bruises/hematomas that are blue on her hand and her breast. The patient hit her head- front right side.  Related visit to physician within the last 2 weeks: No  Guideline: Head Injury  Disposition: Go to ED Now (or PCP triage)  Per patient's chart, she was seen at [facility] on yesterday, 01/31/16.

## 2016-02-01 NOTE — ED Provider Notes (Signed)
[facility]-EMERGENCY DEPT Provider Note   CSN: [id number] Arrival date & time: 01/31/16  2235  First Provider Contact:  First MD Initiated Contact with Patient 02/01/16 0211   By signing my name below, I, Bridgette Habermann, attest that this documentation has been prepared under the direction and in the presence of Dione Booze, MD. Electronically Signed: Bridgette Habermann, ED Scribe. 02/01/16. 2:20 AM.  History   Chief Complaint Chief Complaint  Patient presents with  . Fall   HPI Comments: Alison Graves is a 82 y.o. female with h/o osteoarthritis who presents to the Emergency Department complaining of right wrist pain s/p mechanical fall today. Pt states she lost her footing tonight and fell on the brick stairs into the flower bed. No LOC. Pt also has abrasions on her right breast and a hematoma on her right forehead. Pt is on Aspirin with mild relief to the pain. Denies any modifying factors. Pt denies any additional injuries. Denies numbness, paresthesia, or any other associated symptoms. Pt's Tdap is UTD.  The history is provided by the patient. No language interpreter was used.   Past Medical History:  Diagnosis Date  . Hemorrhoids   . HTN (hypertension)   . Hyperlipemia   . Microhematuria    s/p eval DR Patsi Sears 2004  . Osteoarthritis   . PMR (polymyalgia rheumatica) (HCC)    ?   Patient Active Problem List   Diagnosis Date Noted  . PCP NOTES >>>>>>>>>>>>>>>>>>>>>>>>> 09/26/2015  . Edema 02/17/2015  . Anxiety 11/16/2011  . Annual physical exam 10/04/2010  . Osteopenia 10/04/2010  . MENOPAUSE-RELATED VASOMOTOR SYMPTOMS 04/19/2009  . Osteoarthritis 12/16/2008  . Polymyalgia rheumatica (HCC) 01/10/2008  . Dyslipidemia 11/29/2006  . HTN (hypertension) 11/20/2006  . HEMORRHOIDS 07/25/2006   Past Surgical History:  Procedure Laterality Date  . BREAST BIOPSY  2005   benign  . CATARACT EXTRACTION    . LAPAROSCOPIC HYSTERECTOMY     per Dr Lily Peer, ?oophorectomy   OB History    No  data available     Home Medications    Prior to Admission medications   Medication Sig Start Date End Date Taking? Authorizing Provider  acetaminophen (TYLENOL) 650 MG CR tablet Take 650 mg by mouth every 8 (eight) hours as needed. Reported on 01/18/2016   Yes Historical Provider, MD  aspirin 81 MG tablet Take 81 mg by mouth daily.     Yes Historical Provider, MD  Calcium Carbonate-Vitamin D (CALCIUM 600 + D PO) Take 3 tablets by mouth daily.    Yes Historical Provider, MD  carvedilol (COREG) 25 MG tablet Take 1 tablet (25 mg total) by mouth 2 (two) times daily with a meal. 12/06/15  Yes Wanda Plump, MD  Cholecalciferol (VITAMIN D3) 5000 units TABS Take 4 tablets by mouth daily.   Yes Historical Provider, MD  cloNIDine (CATAPRES) 0.2 MG tablet Take 1 tablet (0.2 mg total) by mouth 3 (three) times daily. 12/06/15  Yes Wanda Plump, MD  fish oil-omega-3 fatty acids 1000 MG capsule Take 4 g by mouth daily. Reported on 01/18/2016   Yes Historical Provider, MD  losartan-hydrochlorothiazide (HYZAAR) 100-25 MG tablet Take 1 tablet by mouth daily. 10/05/15  Yes Wanda Plump, MD  metFORMIN (GLUCOPHAGE) 500 MG tablet Take 1 tablet (500 mg total) by mouth 2 (two) times daily with a meal. 09/27/15  Yes Wanda Plump, MD  Multiple Vitamin (MULTIVITAMIN PO) Take 1 tablet by mouth daily.    Yes Historical Provider, MD   Family  History Family History  Problem Relation Age of Onset  . Diabetes Other     grandmother  . Coronary artery disease Mother     early in life   . Stroke Neg Hx   . Hypertension Neg Hx   . Cancer Neg Hx    Social History Social History  Substance Use Topics  . Smoking status: Never Smoker  . Smokeless tobacco: Never Used  . Alcohol use No   Allergies   Review of patient's allergies indicates no known allergies. Review of Systems Review of Systems  Constitutional: Negative for fever.  Musculoskeletal: Positive for arthralgias.  Neurological: Negative for numbness.  All other systems  reviewed and are negative.  Physical Exam Updated Vital Signs BP 197/83 (BP Location: Right Arm)   Pulse 74   Temp 98.1 F (36.7 C) (Oral)   Resp 17   SpO2 93%   Physical Exam  Constitutional: She is oriented to person, place, and time. She appears well-developed and well-nourished.  HENT:  Head: Normocephalic and atraumatic.  Ecchymosis right side of forehead.  Eyes: EOM are normal. Pupils are equal, round, and reactive to light.  Neck: Normal range of motion. Neck supple. No JVD present.  Mild tenderness diffusely.  Cardiovascular: Normal rate, regular rhythm and normal heart sounds.   No murmur heard. Pulmonary/Chest: Effort normal and breath sounds normal. She has no wheezes. She has no rales. She exhibits tenderness.  Ecchymosis over right chest. Mild anterior chest wall tenderness, no crepitus.  Abdominal: Soft. She exhibits no distension and no mass. There is no tenderness.  Musculoskeletal: Normal range of motion. She exhibits edema and tenderness.  Abrasion over right ulnar styloid. Ecchymosis over dorsum of right hand. Ecchymosis and mild tenderness over the left fourth and fifth MCP joints. Minor ecchymosis over anterior aspect of bilateral knees, no swelling. 2+ pre-tibial edema.  Lymphadenopathy:    She has no cervical adenopathy.  Neurological: She is alert and oriented to person, place, and time. No cranial nerve deficit. She exhibits normal muscle tone. Coordination normal.  Skin: Skin is warm and dry. No rash noted.  Psychiatric: She has a normal mood and affect. Her behavior is normal. Judgment and thought content normal.  Nursing note and vitals reviewed.  ED Treatments / Results  DIAGNOSTIC STUDIES: Oxygen Saturation is 93% on RA, poor by my interpretation.    COORDINATION OF CARE: 2:14 AM Discussed treatment plan with pt at bedside which includes CXR and head CT and pt agreed to plan.  Radiology Dg Chest 2 View  Result Date: 02/01/2016 CLINICAL DATA:   82 year old female with fall and right breast area. EXAM: CHEST  2 VIEW COMPARISON:  Chest radiograph dated 12/16/2014 FINDINGS: Top-normal cardiac size mild central vascular prominent. Minimal bibasilar atelectatic changes. No focal consolidation, pleural effusion, or pneumothorax. There is mild scoliosis with degenerative changes of the spine. No acute fracture. IMPRESSION: Mild central vascular congestion. No focal consolidation or pneumothorax. Electronically Signed   By: Elgie Collard M.D.   On: 02/01/2016 03:13   Dg Wrist Complete Right  Result Date: 01/31/2016 CLINICAL DATA:  82 year old female with fall and right wrist pain. EXAM: RIGHT WRIST - COMPLETE 3+ VIEW COMPARISON:  None. FINDINGS: There is no definite acute fracture or dislocation. The bones are osteopenic. There is advanced osteoarthritic changes with subcortical cyst formation in the distal radius ulna and carpal bones. There is osteoarthritic changes of the base of the thumb. The soft tissues appear unremarkable. No radiopaque foreign object identified.  IMPRESSION: No acute fracture or dislocation. Osteoarthritic changes. Electronically Signed   By: Elgie Collard M.D.   On: 01/31/2016 23:30   Ct Head Wo Contrast  Result Date: 02/01/2016 CLINICAL DATA:  Fall into flower bed, right forehead hematoma. No loss of consciousness. EXAM: CT HEAD WITHOUT CONTRAST CT CERVICAL SPINE WITHOUT CONTRAST TECHNIQUE: Multidetector CT imaging of the head and cervical spine was performed following the standard protocol without intravenous contrast. Multiplanar CT image reconstructions of the cervical spine were also generated. COMPARISON:  Head CT 06/06/2013 FINDINGS: CT HEAD FINDINGS No intracranial hemorrhage, mass effect, or midline shift. No hydrocephalus. The basilar cisterns are patent. Age related atrophy. Scattered basal ganglia calcifications are unchanged. No evidence of territorial infarct. No intracranial fluid collection. Atherosclerosis of  skullbase vasculature. Calvarium is intact. Minimal mucosal thickening of inferior maxillary sinuses. Mastoid air cells are well aerated. CT CERVICAL SPINE FINDINGS No fracture or acute subluxation. The dens is intact. There are no jumped or perched facets. Multilevel degenerative disc disease and facet arthropathy. Anterolisthesis of C6 on C7, C7 on T1 and to a lesser extent C5 on C6 and C4 on C5 appears degenerative. Multilevel disc space narrowing most prominent at C6-C7. Advanced multilevel facet arthropathy with bony ankylosis on the left and CT C3 and C3-C4. Chronic change at the C1-C2 articulation. No prevertebral soft tissue edema. IMPRESSION: 1.  No acute intracranial abnormality. 2. Multilevel degenerative disc disease and facet arthropathy throughout cervical spine. No acute fracture or subluxation. Electronically Signed   By: Rubye Oaks M.D.   On: 02/01/2016 03:16   Ct Cervical Spine Wo Contrast  Result Date: 02/01/2016 CLINICAL DATA:  Fall into flower bed, right forehead hematoma. No loss of consciousness. EXAM: CT HEAD WITHOUT CONTRAST CT CERVICAL SPINE WITHOUT CONTRAST TECHNIQUE: Multidetector CT imaging of the head and cervical spine was performed following the standard protocol without intravenous contrast. Multiplanar CT image reconstructions of the cervical spine were also generated. COMPARISON:  Head CT 06/06/2013 FINDINGS: CT HEAD FINDINGS No intracranial hemorrhage, mass effect, or midline shift. No hydrocephalus. The basilar cisterns are patent. Age related atrophy. Scattered basal ganglia calcifications are unchanged. No evidence of territorial infarct. No intracranial fluid collection. Atherosclerosis of skullbase vasculature. Calvarium is intact. Minimal mucosal thickening of inferior maxillary sinuses. Mastoid air cells are well aerated. CT CERVICAL SPINE FINDINGS No fracture or acute subluxation. The dens is intact. There are no jumped or perched facets. Multilevel degenerative  disc disease and facet arthropathy. Anterolisthesis of C6 on C7, C7 on T1 and to a lesser extent C5 on C6 and C4 on C5 appears degenerative. Multilevel disc space narrowing most prominent at C6-C7. Advanced multilevel facet arthropathy with bony ankylosis on the left and CT C3 and C3-C4. Chronic change at the C1-C2 articulation. No prevertebral soft tissue edema. IMPRESSION: 1.  No acute intracranial abnormality. 2. Multilevel degenerative disc disease and facet arthropathy throughout cervical spine. No acute fracture or subluxation. Electronically Signed   By: Rubye Oaks M.D.   On: 02/01/2016 03:16   Dg Knee Complete 4 Views Left  Result Date: 02/01/2016 CLINICAL DATA:  Lost balance and fall into flower bed. Pain and bruising. EXAM: LEFT KNEE - COMPLETE 4+ VIEW COMPARISON:  None. FINDINGS: No acute fracture or dislocation. Moderate to advanced tricompartmental osteoarthritis most prominent in the medial compartment. There is meniscal chondrocalcinosis. No large joint effusion. IMPRESSION: 1. No acute fracture or subluxation. 2. Moderate to advanced tricompartmental osteoarthritis. Chondrocalcinosis. Electronically Signed   By: Lujean Rave.D.  On: 02/01/2016 03:00   Dg Knee Complete 4 Views Right  Result Date: 02/01/2016 CLINICAL DATA:  Lost balance and fell into flower bed. Right knee pain. EXAM: RIGHT KNEE - COMPLETE 4+ VIEW COMPARISON:  None. FINDINGS: No acute fracture or subluxation. Moderate to advanced tricompartmental osteoarthritis most prominent in the medial compartment. Chondrocalcinosis of the menisci. No definite joint effusion. Capsular calcification on probable intra-articular bodies posteriorly. IMPRESSION: 1. No acute fracture or subluxation of the right knee. 2. Moderate to advanced tricompartmental degenerative change. Electronically Signed   By: Rubye Oaks M.D.   On: 02/01/2016 03:23   Dg Hand Complete Left  Result Date: 02/01/2016 CLINICAL DATA:  Lost balance and  fell into flower bed. Left hand pain and bruising. EXAM: LEFT HAND - COMPLETE 3+ VIEW COMPARISON:  None. FINDINGS: No evidence of acute fracture or dislocation. Advanced multifocal inflammatory arthropathy. Central erosions with Gull wing deformity involves the DIP joints of the digits. There is radial subluxation of the thumb distal phalanx. Multiple erosions involving the carpals and distal radius. Fluffy periarticular soft tissue calcifications. Chondrocalcinosis of the triangular fibrocartilage. IMPRESSION: 1. No evidence of acute fracture or dislocation. 2. Moderate to advanced inflammatory arthropathy. Inflammatory osteoarthritis is considered, however proximal involvement can be seen in the setting of rheumatoid arthritis or other inflammatory arthropathies. Electronically Signed   By: Rubye Oaks M.D.   On: 02/01/2016 03:05    Procedures Procedures (including critical care time)  Medications Ordered in ED Medications - No data to display   Initial Impression / Assessment and Plan / ED Course  I have reviewed the triage vital signs and the nursing notes.  Pertinent labs & imaging results that were available during my care of the patient were reviewed by me and considered in my medical decision making (see chart for details).  Clinical Course    Fall with multiple contusions but no evidence of serious injury. She sent for CT of head and cervical spine as well as plain films of chest, right wrist, left hand, both knees. All radiology reports show no evidence of fracture and were visualized by me independently. She is discharged with prescription for tramadol.  Final Clinical Impressions(s) / ED Diagnoses   Final diagnoses:  Fall at home, initial encounter  Contusion of right breast, initial encounter  Contusion of forehead, initial encounter  Abrasion of right wrist, initial encounter  Contusion, knee, unspecified laterality, initial encounter  Contusion, hand, unspecified  laterality, initial encounter    New Prescriptions New Prescriptions   TRAMADOL (ULTRAM) 50 MG TABLET    Take 1 tablet (50 mg total) by mouth every 6 (six) hours as needed.  I personally performed the services described in this documentation, which was scribed in my presence. The recorded information has been reviewed and is accurate.       Dione Booze, MD 02/01/16 [phone]

## 2016-02-02 ENCOUNTER — Other Ambulatory Visit: Payer: Self-pay | Admitting: Internal Medicine

## 2016-02-16 ENCOUNTER — Encounter: Payer: Self-pay | Admitting: Rheumatology

## 2016-02-16 DIAGNOSIS — M79641 Pain in right hand: Secondary | ICD-10-CM | POA: Diagnosis not present

## 2016-02-16 DIAGNOSIS — M25511 Pain in right shoulder: Secondary | ICD-10-CM | POA: Diagnosis not present

## 2016-02-16 DIAGNOSIS — E559 Vitamin D deficiency, unspecified: Secondary | ICD-10-CM | POA: Diagnosis not present

## 2016-02-16 DIAGNOSIS — Z79899 Other long term (current) drug therapy: Secondary | ICD-10-CM | POA: Diagnosis not present

## 2016-02-16 DIAGNOSIS — M79671 Pain in right foot: Secondary | ICD-10-CM | POA: Diagnosis not present

## 2016-02-16 DIAGNOSIS — R5381 Other malaise: Secondary | ICD-10-CM | POA: Diagnosis not present

## 2016-02-16 DIAGNOSIS — M79642 Pain in left hand: Secondary | ICD-10-CM | POA: Diagnosis not present

## 2016-02-16 DIAGNOSIS — M2550 Pain in unspecified joint: Secondary | ICD-10-CM | POA: Diagnosis not present

## 2016-02-16 DIAGNOSIS — M25562 Pain in left knee: Secondary | ICD-10-CM | POA: Diagnosis not present

## 2016-02-16 DIAGNOSIS — M25512 Pain in left shoulder: Secondary | ICD-10-CM | POA: Diagnosis not present

## 2016-02-16 DIAGNOSIS — M810 Age-related osteoporosis without current pathological fracture: Secondary | ICD-10-CM | POA: Diagnosis not present

## 2016-02-16 DIAGNOSIS — R5383 Other fatigue: Secondary | ICD-10-CM | POA: Diagnosis not present

## 2016-02-16 DIAGNOSIS — M79672 Pain in left foot: Secondary | ICD-10-CM | POA: Diagnosis not present

## 2016-02-16 DIAGNOSIS — M25561 Pain in right knee: Secondary | ICD-10-CM | POA: Diagnosis not present

## 2016-02-16 LAB — POCT ERYTHROCYTE SEDIMENTATION RATE, NON-AUTOMATED: SED RATE: 14 mm

## 2016-02-16 LAB — BASIC METABOLIC PANEL
BUN: 24 mg/dL — AB (ref 4–21)
Creatinine: 1 mg/dL (ref 0.5–1.1)
GLUCOSE: 124 mg/dL
Potassium: 4.2 mmol/L (ref 3.4–5.3)
SODIUM: 134 mmol/L — AB (ref 137–147)

## 2016-02-16 LAB — HEPATIC FUNCTION PANEL
ALT: 28 U/L (ref 7–35)
AST: 30 U/L (ref 13–35)
Alkaline Phosphatase: 54 U/L (ref 25–125)
BILIRUBIN, TOTAL: 1.2 mg/dL

## 2016-02-16 LAB — CBC AND DIFFERENTIAL
HCT: 39 % (ref 36–46)
Hemoglobin: 13 g/dL (ref 12.0–16.0)
NEUTROS ABS: 17836 /uL
PLATELETS: 219 10*3/uL (ref 150–399)
WBC: 19.6 10^3/mL

## 2016-02-16 LAB — TSH: TSH: 1.31 u[IU]/mL (ref 0.41–5.90)

## 2016-02-24 ENCOUNTER — Ambulatory Visit (INDEPENDENT_AMBULATORY_CARE_PROVIDER_SITE_OTHER): Payer: Medicare Other | Admitting: Internal Medicine

## 2016-02-24 ENCOUNTER — Encounter: Payer: Self-pay | Admitting: Internal Medicine

## 2016-02-24 VITALS — BP 132/78 | HR 66 | Temp 97.8°F | Resp 14 | Ht <= 58 in | Wt 140.4 lb

## 2016-02-24 DIAGNOSIS — E611 Iron deficiency: Secondary | ICD-10-CM

## 2016-02-24 DIAGNOSIS — R21 Rash and other nonspecific skin eruption: Secondary | ICD-10-CM | POA: Diagnosis not present

## 2016-02-24 DIAGNOSIS — D509 Iron deficiency anemia, unspecified: Secondary | ICD-10-CM | POA: Diagnosis not present

## 2016-02-24 DIAGNOSIS — D72829 Elevated white blood cell count, unspecified: Secondary | ICD-10-CM

## 2016-02-24 LAB — CBC WITH DIFFERENTIAL/PLATELET
Basophils Absolute: 0 10*3/uL (ref 0.0–0.1)
Basophils Relative: 0.4 % (ref 0.0–3.0)
EOS PCT: 3.7 % (ref 0.0–5.0)
Eosinophils Absolute: 0.3 10*3/uL (ref 0.0–0.7)
HEMATOCRIT: 39 % (ref 36.0–46.0)
Hemoglobin: 13.2 g/dL (ref 12.0–15.0)
LYMPHS ABS: 2.1 10*3/uL (ref 0.7–4.0)
LYMPHS PCT: 30.2 % (ref 12.0–46.0)
MCHC: 33.9 g/dL (ref 30.0–36.0)
MCV: 93.2 fl (ref 78.0–100.0)
MONOS PCT: 7 % (ref 3.0–12.0)
Monocytes Absolute: 0.5 10*3/uL (ref 0.1–1.0)
NEUTROS ABS: 4 10*3/uL (ref 1.4–7.7)
NEUTROS PCT: 58.7 % (ref 43.0–77.0)
PLATELETS: 301 10*3/uL (ref 150.0–400.0)
RBC: 4.19 Mil/uL (ref 3.87–5.11)
RDW: 13.4 % (ref 11.5–15.5)
WBC: 6.9 10*3/uL (ref 4.0–10.5)

## 2016-02-24 NOTE — Progress Notes (Signed)
Subjective:    Patient ID: Alison Graves, female    DOB: 09/14/1931, 82 y.o.   MRN: [id number]  DOS:  02/24/2016 Type of visit - description : acute, here w/ one of her daughters Interval history: Patient went to see rheumatology regards DJD, some of the labs were abnormal, see below. Very concerned about a low iron and elevated white count. The day prior to the blood work, she had fever, malaise. The day after the blood check, she developed redness, swelling and tenderness at the left pretibial and calf area. They think he was related to a mosquito bite, did not seek medical attention, is resolving spontaneously, no further fever.  Labs on 02/16/2016: Sodium 134, potassium 4.2, creatinine 0.9 CBC: WBC 19.6, hemoglobin 13, platelets 219. Sedimentation rate 14, uric acid 6.2, rheumatoid factor negative, M.D. elevated at 137. Iron 14, ferritin 166 normal  Review of Systems At this point she is feeling back to baseline. No fever chills. Had a fall recently, went to the ER, x-rays negative. Denies headache, neck pain or unusual joint pains. No nausea, vomiting, diarrhea. No abdominal pain or blood in the stools.  Past Medical History:  Diagnosis Date  . Hemorrhoids   . HTN (hypertension)   . Hyperlipemia   . Microhematuria    s/p eval DR Patsi Searsannenbaum 2004  . Osteoarthritis   . PMR (polymyalgia rheumatica) (HCC)    ?    Past Surgical History:  Procedure Laterality Date  . BREAST BIOPSY  2005   benign  . CATARACT EXTRACTION    . LAPAROSCOPIC HYSTERECTOMY     per Dr Lily PeerFernandez, ?oophorectomy    Social History   Social History  . Marital status: Widowed    Spouse name: N/A  . Number of children: 3  . Years of education: N/A   Occupational History  . retired     Social History Main Topics  . Smoking status: Never Smoker  . Smokeless tobacco: Never Used  . Alcohol use No  . Drug use: No  . Sexual activity: Yes    Birth control/ protection: Surgical   Other Topics  Concern  . Not on file   Social History Narrative   Widow,  Original from [place] , lives w/ 2 of her 3 daughters        Medication List       Accurate as of 02/24/16  2:09 PM. Always use your most recent med list.          acetaminophen 650 MG CR tablet Commonly known as:  TYLENOL Take 650 mg by mouth every 8 (eight) hours as needed. Reported on 01/18/2016   aspirin 81 MG tablet Take 81 mg by mouth daily.   CALCIUM 600 + D PO Take 3 tablets by mouth daily.   carvedilol 25 MG tablet Commonly known as:  COREG Take 1 tablet (25 mg total) by mouth 2 (two) times daily with a meal.   cloNIDine 0.2 MG tablet Commonly known as:  CATAPRES Take 1 tablet (0.2 mg total) by mouth 3 (three) times daily.   fish oil-omega-3 fatty acids 1000 MG capsule Take 4 g by mouth daily. Reported on 01/18/2016   losartan-hydrochlorothiazide 100-25 MG tablet Commonly known as:  HYZAAR Take 1 tablet by mouth daily.   metFORMIN 500 MG tablet Commonly known as:  GLUCOPHAGE Take 1 tablet (500 mg total) by mouth 2 (two) times daily with a meal.   MULTIVITAMIN PO Take 1 tablet by mouth daily.  Vitamin D3 5000 units Tabs Take 4 tablets by mouth daily.          Objective:   Physical Exam  Skin:      BP 132/78 (BP Location: Left Arm, Patient Position: Sitting, Cuff Size: Normal)   Pulse 66   Temp 97.8 F (36.6 C) (Oral)   Resp 14   Ht 4\' 10"  (1.473 m)   Wt 140 lb 6 oz (63.7 kg)   SpO2 96%   BMI 29.34 kg/m  General:   Well developed, well nourished . NAD.  HEENT:  Normocephalic . Face symmetric, atraumatic Lungs:  CTA B Normal respiratory effort, no intercostal retractions, no accessory muscle use. Heart: RRR,  no murmur.  No pretibial edema bilaterally  Neurologic:  alert & oriented X3.  Speech normal, gait assisted by her daughter and quite limited due to DJD Psych--  Cognition and judgment appear intact.  Cooperative with normal attention span and concentration.    Behavior appropriate. No anxious or depressed appearing.      Assessment & Plan:    Assessment DM HTN Hyperlipidemia LE edema  Microhematuria, urology eval 2004 Osteopenia-- t score -1.1 2011, t score normal 10-2015 DJD - knees H/o PMR  Years ago, saw rheumatology (Dr Coral SpikesLevitin) , dx PMR with normal sed rate- CRP but elevated CK. Responded to steroids   PLAN: Low iron: Labs reviewed, see HPI, hemoglobin is normal, iron is low, TIBC , MCV, MCH normal. Ferritin normal Likely low low iron is due to  chronic disease. Recommend observation for now. Elevated white count: WBCs  elevated in the context of patient having fever and a rash at the left leg. The rash is actually resolving spontaneously without any antibiotics.  We'll recheck a CBC today, anticipate it will be better. Also because the rash the family strongly request Lyme disease serology. Will do. They do not recall any tick bites. DJD: Saw rheumatology. OV report pending

## 2016-02-24 NOTE — Progress Notes (Signed)
Pre visit review using our clinic review tool, if applicable. No additional management support is needed unless otherwise documented below in the visit note. 

## 2016-02-24 NOTE — Assessment & Plan Note (Signed)
Low iron: Labs reviewed, see HPI, hemoglobin is normal, iron is low, TIBC , MCV, MCH normal. Ferritin normal Likely low low iron is due to  chronic disease. Recommend observation for now. Elevated white count: WBCs  elevated in the context of patient having fever and a rash at the left leg. The rash is actually resolving spontaneously without any antibiotics.  We'll recheck a CBC today, anticipate it will be better. Also because the rash the family strongly request Lyme disease serology. Will do. They do not recall any tick bites. DJD: Saw rheumatology. OV report pending

## 2016-02-24 NOTE — Patient Instructions (Signed)
Go to the lab.

## 2016-02-25 LAB — LYME AB/WESTERN BLOT REFLEX: B burgdorferi Ab IgG+IgM: <0.9 Index (ref <0.90)

## 2016-02-29 ENCOUNTER — Telehealth: Payer: Self-pay | Admitting: Internal Medicine

## 2016-02-29 NOTE — Telephone Encounter (Signed)
Pt's daughter called in for lab results. She says that mom doesn't speak very good english.   CB: [phone] - Ronda FairlyCarla Clayton -daughter

## 2016-03-01 NOTE — Telephone Encounter (Signed)
LMOM informing Pt's daughter, Albin FellingCarla, of lab results. Instructed her to call if she has any further questions or concerns.

## 2016-03-01 NOTE — Telephone Encounter (Signed)
Notes Recorded by Conrad BurlingtonKaylyn Nicolas Banh, CMA on 02/25/2016 at 2:17 PM EDT Results released to MyChart. ------  Notes Recorded by Wanda PlumpJose E Paz, MD on 02/25/2016 at 1:59 PM EDT Release these results to MyChart with attached comments  TurkeyVictoria, the Lyme serology came back negative. Good results ------  Notes Recorded by Conrad BurlingtonKaylyn Zahlia Deshazer, CMA on 02/25/2016 at 11:20 AM EDT Results released to MyChart. ------  Notes Recorded by Wanda PlumpJose E Paz, MD on 02/25/2016 at 11:10 AM EDT Release these results to MyChart with attached comments  Tur[place]Victoria, your blood count is back to normal. The serology for Lyme disease is still pending

## 2016-04-24 DIAGNOSIS — M170 Bilateral primary osteoarthritis of knee: Secondary | ICD-10-CM | POA: Insufficient documentation

## 2016-04-24 DIAGNOSIS — M19072 Primary osteoarthritis, left ankle and foot: Secondary | ICD-10-CM

## 2016-04-24 DIAGNOSIS — M19071 Primary osteoarthritis, right ankle and foot: Secondary | ICD-10-CM | POA: Insufficient documentation

## 2016-04-24 DIAGNOSIS — M19041 Primary osteoarthritis, right hand: Secondary | ICD-10-CM | POA: Insufficient documentation

## 2016-04-24 DIAGNOSIS — M1120 Other chondrocalcinosis, unspecified site: Secondary | ICD-10-CM | POA: Insufficient documentation

## 2016-04-24 DIAGNOSIS — M19042 Primary osteoarthritis, left hand: Secondary | ICD-10-CM

## 2016-04-25 ENCOUNTER — Ambulatory Visit (INDEPENDENT_AMBULATORY_CARE_PROVIDER_SITE_OTHER): Payer: Medicare Other | Admitting: Rheumatology

## 2016-04-25 ENCOUNTER — Encounter: Payer: Self-pay | Admitting: Rheumatology

## 2016-04-25 VITALS — BP 147/69 | HR 70 | Resp 12 | Ht <= 58 in | Wt 143.0 lb

## 2016-04-25 DIAGNOSIS — M19042 Primary osteoarthritis, left hand: Secondary | ICD-10-CM

## 2016-04-25 DIAGNOSIS — M19041 Primary osteoarthritis, right hand: Secondary | ICD-10-CM

## 2016-04-25 DIAGNOSIS — M170 Bilateral primary osteoarthritis of knee: Secondary | ICD-10-CM

## 2016-04-25 DIAGNOSIS — M8580 Other specified disorders of bone density and structure, unspecified site: Secondary | ICD-10-CM

## 2016-04-25 DIAGNOSIS — M19072 Primary osteoarthritis, left ankle and foot: Secondary | ICD-10-CM

## 2016-04-25 DIAGNOSIS — M1120 Other chondrocalcinosis, unspecified site: Secondary | ICD-10-CM

## 2016-04-25 DIAGNOSIS — M19071 Primary osteoarthritis, right ankle and foot: Secondary | ICD-10-CM

## 2016-04-25 NOTE — Patient Instructions (Signed)
Supplements for OA Natural anti-inflammatories  You can purchase these at [employer], [employer] or online.  . Turmeric (capsules)  . Ginger (ginger root or capsules)  . Omega 3 (Fish, flax seeds, chia seeds, walnuts, almonds)  . Tart cherry (dried or extract)   Patient should be under the care of a physician while taking these supplements. This may not be reproduced without the permission of Dr. Kiva Norland.  

## 2016-04-25 NOTE — Progress Notes (Signed)
*IMAGE* Office Visit Note  Patient: Alison Graves             Date of Birth: 05/28/1932           MRN: [id number]             PCP: Willow Ora, MD Referring: Wanda Plump, MD Visit Date: 04/25/2016    Subjective:  No chief complaint on file.   History of Present Illness: MARGIE URBANOWICZ is a 82 y.o. female with osteoarthritis. She continues to have ongoing pain in her bilateral knee joints. In her hands and her both feet. She complains of pedal edema but no joint swelling.  Activities of Daily Living:  Patient reports morning stiffness for 30 minutes.   Patient Reports nocturnal pain.  Difficulty dressing/grooming: Reports Difficulty climbing stairs: Reports Difficulty getting out of chair: Reports Difficulty using hands for taps, buttons, cutlery, and/or writing: Denies   Review of Systems  Constitutional: Positive for fatigue.  HENT: Positive for mouth dryness.   Eyes: Negative.  Negative for dryness.  Respiratory: Negative.   Cardiovascular: Negative.  Positive for hypertension.  Gastrointestinal: Negative.   Endocrine: Negative.   Genitourinary: Negative.   Musculoskeletal: Positive for arthralgias, joint pain, joint swelling, muscle weakness and morning stiffness.  Allergic/Immunologic: Negative.   Neurological: Negative.   Hematological: Negative.   Psychiatric/Behavioral: The patient is nervous/anxious.     PMFS History:  Patient Active Problem List   Diagnosis Date Noted  . Chondrocalcinosis 04/24/2016  . Osteoarthritis of patellofemoral joints of both knees 04/24/2016  . Osteoarthritis of both hands 04/24/2016  . Osteoarthritis of feet, bilateral 04/24/2016  . PCP NOTES >>>>>>>>>>>>>>>>>>>>>>>>> 09/26/2015  . Edema 02/17/2015  . Anxiety 11/16/2011  . Annual physical exam 10/04/2010  . Osteopenia 10/04/2010  . MENOPAUSE-RELATED VASOMOTOR SYMPTOMS 04/19/2009  . Osteoarthritis of both knees 12/16/2008  . Polymyalgia rheumatica (HCC) 01/10/2008  .  Dyslipidemia 11/29/2006  . HTN (hypertension) 11/20/2006  . HEMORRHOIDS 07/25/2006    Past Medical History:  Diagnosis Date  . Hemorrhoids   . HTN (hypertension)   . Hyperlipemia   . Microhematuria    s/p eval DR Patsi Sears 2004  . Osteoarthritis   . PMR (polymyalgia rheumatica) (HCC)    ?    Family History  Problem Relation Age of Onset  . Coronary artery disease Mother     early in life   . Diabetes Other     grandmother  . Stroke Neg Hx   . Hypertension Neg Hx   . Cancer Neg Hx    Past Surgical History:  Procedure Laterality Date  . BREAST BIOPSY  2005   benign  . CATARACT EXTRACTION    . LAPAROSCOPIC HYSTERECTOMY     per Dr Lily Peer, ?oophorectomy   Social History   Social History Narrative   Widow,  Original from [place] , lives w/ 2 of her 3 daughters     Objective: Vital Signs: BP (!) 147/69 (BP Location: Left Arm, Patient Position: Sitting, Cuff Size: Large)   Pulse 70   Resp 12   Ht 4\' 10"  (1.473 m)   Wt 143 lb (64.9 kg)   BMI 29.89 kg/m    Physical Exam  Constitutional: She is oriented to person, place, and time. She appears well-developed and well-nourished.  HENT:  Head: Normocephalic and atraumatic.  Eyes: EOM are normal. Pupils are equal, round, and reactive to light.  Neck: Normal range of motion. Neck supple.  Cardiovascular: Normal rate, regular rhythm and normal heart  sounds.   Pulmonary/Chest: Effort normal and breath sounds normal.  Abdominal: Soft. Bowel sounds are normal.  Lymphadenopathy:    She has no cervical adenopathy.  Neurological: She is alert and oriented to person, place, and time.  Skin: Skin is warm and dry. Capillary refill takes 2 to 3 seconds.  Psychiatric: She has a normal mood and affect. Her behavior is normal.     Musculoskeletal Exam:  She has good range of motion of her C-spine. She has good range of motion of her shoulders elbows joints wrist joints she has thickening of bilateral CMC PIP/DIP joint with  subluxation of bilateral first PIP joint. These findings were consistent with severe osteoarthritis of her hands. Her knee joints have full extension no warmth swelling or effusion was noted. Ankle joints have pitting edema but no swelling in the joint itself PIP/DIP thickening and feet was noted. CDAI Exam: No CDAI exam completed.    Investigation: No additional findings.   Imaging: No results found.  Speciality Comments: No specialty comments available.    Procedures:  No procedures performed Allergies: Review of patient's allergies indicates no known allergies.   Assessment / Plan: Visit Diagnoses: Chondrocalcinosis She has chondrocalcinosis. She is not having any acute flare with no synovitis or swelling.  Osteoarthritis of patellofemoral joints of both knees She has severe chondromalacia patella which continues to be a problem.  Primary osteoarthritis of both hands She has severe osteoarthritis of her bilateral hands. Joint protection and muscle strengthening was discussed.  Primary osteoarthritis of both feet She has osteoarthritis of bilateral feet, proper fitting shoes will be useful.  Osteopenia, unspecified location Use of calcium and vitamin D and muscle strengthening was discussed.  Primary osteoarthritis of both knees She has severe osteoarthritis of bilateral knee joints. It causes problems with difficulty with daily activities . We will refer her to orthopedics for total knee replacement. She has tried injections in the past without good results. She had no results with visco supplement injections.  No problem-specific Assessment & Plan notes found for this encounter.   Follow-Up Instructions: Return in about 6 months (around 10/24/2016) for Osteoarthritis.  Orders: Orders Placed This Encounter  Procedures  . Ambulatory referral to Orthopedic Surgery   No orders of the defined types were placed in this encounter.

## 2016-05-05 ENCOUNTER — Other Ambulatory Visit: Payer: Self-pay | Admitting: Internal Medicine

## 2016-05-13 ENCOUNTER — Telehealth: Payer: Self-pay | Admitting: Internal Medicine

## 2016-05-13 NOTE — Telephone Encounter (Signed)
complete

## 2016-05-17 ENCOUNTER — Ambulatory Visit: Payer: Medicare Other | Admitting: Internal Medicine

## 2016-05-24 ENCOUNTER — Ambulatory Visit (INDEPENDENT_AMBULATORY_CARE_PROVIDER_SITE_OTHER): Payer: Medicare Other | Admitting: Internal Medicine

## 2016-05-24 ENCOUNTER — Encounter: Payer: Self-pay | Admitting: Internal Medicine

## 2016-05-24 ENCOUNTER — Other Ambulatory Visit: Payer: Medicare Other

## 2016-05-24 ENCOUNTER — Telehealth: Payer: Self-pay

## 2016-05-24 VITALS — BP 132/76 | HR 71 | Temp 98.1°F | Resp 12 | Ht <= 58 in | Wt 138.5 lb

## 2016-05-24 DIAGNOSIS — Z23 Encounter for immunization: Secondary | ICD-10-CM | POA: Diagnosis not present

## 2016-05-24 DIAGNOSIS — H538 Other visual disturbances: Secondary | ICD-10-CM

## 2016-05-24 DIAGNOSIS — H5315 Visual distortions of shape and size: Secondary | ICD-10-CM | POA: Diagnosis not present

## 2016-05-24 DIAGNOSIS — G43909 Migraine, unspecified, not intractable, without status migrainosus: Secondary | ICD-10-CM | POA: Diagnosis not present

## 2016-05-24 DIAGNOSIS — E1165 Type 2 diabetes mellitus with hyperglycemia: Secondary | ICD-10-CM | POA: Diagnosis not present

## 2016-05-24 DIAGNOSIS — E118 Type 2 diabetes mellitus with unspecified complications: Secondary | ICD-10-CM

## 2016-05-24 DIAGNOSIS — I10 Essential (primary) hypertension: Secondary | ICD-10-CM | POA: Diagnosis not present

## 2016-05-24 DIAGNOSIS — H353131 Nonexudative age-related macular degeneration, bilateral, early dry stage: Secondary | ICD-10-CM | POA: Diagnosis not present

## 2016-05-24 DIAGNOSIS — Z961 Presence of intraocular lens: Secondary | ICD-10-CM | POA: Diagnosis not present

## 2016-05-24 DIAGNOSIS — T887XXA Unspecified adverse effect of drug or medicament, initial encounter: Secondary | ICD-10-CM

## 2016-05-24 DIAGNOSIS — Q738 Other reduction defects of unspecified limb(s): Secondary | ICD-10-CM

## 2016-05-24 LAB — BASIC METABOLIC PANEL
BUN: 19 mg/dL (ref 6–23)
CALCIUM: 10.2 mg/dL (ref 8.4–10.5)
CHLORIDE: 101 meq/L (ref 96–112)
CO2: 30 meq/L (ref 19–32)
Creatinine, Ser: 0.82 mg/dL (ref 0.40–1.20)
GFR: 70.84 mL/min (ref >60.00)
GLUCOSE: 125 mg/dL — AB (ref 70–99)
POTASSIUM: 3.9 meq/L (ref 3.5–5.1)
SODIUM: 140 meq/L (ref 135–145)

## 2016-05-24 LAB — SEDIMENTATION RATE: Sed Rate: 10 mm/hr (ref 0–30)

## 2016-05-24 LAB — HEMOGLOBIN A1C
HEMOGLOBIN A1C: 6.7 % — AB (ref <5.7)
MEAN PLASMA GLUCOSE: 146 mg/dL

## 2016-05-24 LAB — HIGH SENSITIVITY CRP: CRP HIGH SENSITIVITY: 2.5 mg/L (ref 0.000–5.000)

## 2016-05-24 MED ORDER — ATORVASTATIN CALCIUM 10 MG PO TABS: 10.0000 mg | ORAL_TABLET | Freq: Every day | ORAL | 1 refills | Status: DC

## 2016-05-24 NOTE — Progress Notes (Signed)
Pre visit review using our clinic review tool, if applicable. No additional management support is needed unless otherwise documented below in the visit note. 

## 2016-05-24 NOTE — Telephone Encounter (Signed)
[facility] No answer, LMOM informing them that we were trying to contact them to get mutual Pt in to see Dr. Dione BoozeGroat regarding blurred vision. Requested they call back to our office or Pt number.  Called Dr. Randon Goldsmithigby's office-no appts available. Given number to Dr. Allena KatzPatel at [phone]. They can see Pt today at 12:45:  [address] [place], [place]  [phone]  Pt informed of appt time and location.

## 2016-05-24 NOTE — Assessment & Plan Note (Signed)
Visual disturbances: As described above, bilateral, episodic. She has a history of PMR but currently with no fever or headaches. Plan: Ophthalmology referral for today. Check a sedimentation rate, CRP;  ER if symptoms severe or associated neurological symptoms. Discussed with the patient and her daughter. DM: Check A1c HTN: Controlled, check a BMP Hyperlipidemia: LDL 122, will set more aggressive LDL goal ~  100. Start low-dose of Lipitor, 10 mg. Elevated white count: Last CBC normal. See AVS, RTC 2 months. Fasting

## 2016-05-24 NOTE — Progress Notes (Signed)
Subjective:    Patient ID: Alison Graves, female    DOB: 09/04/1931, 82 y.o.   MRN: [id number]  DOS:  05/24/2016 Type of visit - description : Routine office visit, here with her daughter. Interval history:  DM: Good compliance of medication HTN: Good compliance of medication, ambulatory BPs reportedly normal. The patient is concerned because for the last 2 weeks she is having visual disturbances: Had about 4 episodes were both visual fields seem distorted, no amaurosis fugax, no unilateral symptoms. Symptoms lasted 20 minutes. No associated headache, slurred speech, focal deficit or facial numbness.  Review of Systems  Denies fevers, some weight loss but she has been trying to do better with diet No chest pain or palpitations No nausea or vomiting.   Past Medical History:  Diagnosis Date  . Hemorrhoids   . HTN (hypertension)   . Hyperlipemia   . Microhematuria    s/p eval DR Patsi Searsannenbaum 2004  . Osteoarthritis   . PMR (polymyalgia rheumatica) (HCC)    ?    Past Surgical History:  Procedure Laterality Date  . BREAST BIOPSY  2005   benign  . CATARACT EXTRACTION    . LAPAROSCOPIC HYSTERECTOMY     per Dr Lily PeerFernandez, ?oophorectomy    Social History   Social History  . Marital status: Widowed    Spouse name: N/A  . Number of children: 3  . Years of education: N/A   Occupational History  . retired     Social History Main Topics  . Smoking status: Never Smoker  . Smokeless tobacco: Never Used  . Alcohol use No  . Drug use: No  . Sexual activity: Yes    Birth control/ protection: Surgical   Other Topics Concern  . Not on file   Social History Narrative   Widow,  Original from [place] , lives w/ 2 of her 3 daughters        Medication List       Accurate as of 05/24/16  6:22 PM. Always use your most recent med list.          acetaminophen 650 MG CR tablet Commonly known as:  TYLENOL Take 650 mg by mouth every 8 (eight) hours as needed. Reported  on 01/18/2016   aspirin 81 MG tablet Take 81 mg by mouth daily.   atorvastatin 10 MG tablet Commonly known as:  LIPITOR Take 1 tablet (10 mg total) by mouth daily.   CALCIUM 600 + D PO Take 3 tablets by mouth daily.   carvedilol 25 MG tablet Commonly known as:  COREG Take 1 tablet (25 mg total) by mouth 2 (two) times daily with a meal.   cloNIDine 0.2 MG tablet Commonly known as:  CATAPRES Take 1 tablet (0.2 mg total) by mouth 3 (three) times daily.   fish oil-omega-3 fatty acids 1000 MG capsule Take 4 g by mouth daily. Reported on 01/18/2016   losartan-hydrochlorothiazide 100-25 MG tablet Commonly known as:  HYZAAR Take 1 tablet by mouth daily.   metFORMIN 500 MG tablet Commonly known as:  GLUCOPHAGE Take 1 tablet (500 mg total) by mouth 2 (two) times daily with a meal.   MULTIVITAMIN PO Take 1 tablet by mouth daily.   Vitamin D3 5000 units Tabs Take 4 tablets by mouth daily.          Objective:   Physical Exam BP 132/76 (BP Location: Left Arm, Patient Position: Sitting, Cuff Size: Small)   Pulse 71   Temp 98.1 F (  36.7 C) (Oral)   Resp 12   Ht 4\' 10"  (1.473 m)   Wt 138 lb 8 oz (62.8 kg)   SpO2 97%   BMI 28.95 kg/m  General:   Well developed, well nourished . NAD.  HEENT:  Normocephalic . Face symmetric, atraumatic. No TTP at the temples. EOMI, pupils equal and reactive. Undilated funduscopy is limited but I saw no major vascular problems. No papilledema that I can tell. Neck: Normal carotid pulses, no bruit. Lungs:  CTA B Normal respiratory effort, no intercostal retractions, no accessory muscle use. Heart: RRR,  no murmur.  No pretibial edema bilaterally  Skin: Not pale. Not jaundice Neurologic:  alert & oriented X3.  Speech normal, gait appropriate for age and unassisted Psych--  Cognition and judgment appear intact.  Cooperative with normal attention span and concentration.  Behavior appropriate. No anxious or depressed appearing.        Assessment & Plan:   Assessment DM HTN Hyperlipidemia LE edema  Microhematuria, urology eval 2004 Osteopenia-- t score -1.1 2011, t score normal 10-2015 DJD - knees H/o PMR  Years ago, saw rheumatology (Dr Coral SpikesLevitin) , dx PMR with normal sed rate- CRP but elevated CK. Responded to steroids   PLAN: Visual disturbances: As described above, bilateral, episodic. She has a history of PMR but currently with no fever or headaches. Plan: Ophthalmology referral for today. Check a sedimentation rate, CRP;  ER if symptoms severe or associated neurological symptoms. Discussed with the patient and her daughter. DM: Check A1c HTN: Controlled, check a BMP Hyperlipidemia: LDL 122, will set more aggressive LDL goal ~  100. Start low-dose of Lipitor, 10 mg. Elevated white count: Last CBC normal. See AVS, RTC 2 months. Fasting

## 2016-05-24 NOTE — Patient Instructions (Addendum)
GO TO THE LAB : Get the blood work     GO TO THE FRONT DESK Schedule your next appointment for a  routine checkup in 2 months, fasting  Start taking Lipitor 10 mg one tablet at bedtime. Call if side effects (increased aches and pains)  We are trying to get the eye doctor to see you today.  If you have severe problems with your vision or you have any stroke like symptoms (Difficulty with your speech, facial numbness, weak extremities: call  911 or go to the ER.

## 2016-05-29 ENCOUNTER — Telehealth: Payer: Self-pay

## 2016-05-29 NOTE — Telephone Encounter (Signed)
[facility] back from [facility]'s daughter regarding ophthalmology appointment on 05/24/2016.

## 2016-06-02 NOTE — Telephone Encounter (Signed)
LMOM requesting call back

## 2016-07-26 ENCOUNTER — Ambulatory Visit: Payer: Medicare Other | Admitting: Internal Medicine

## 2016-08-02 ENCOUNTER — Other Ambulatory Visit: Payer: Self-pay | Admitting: Internal Medicine

## 2016-09-06 ENCOUNTER — Telehealth: Payer: Self-pay | Admitting: Internal Medicine

## 2016-09-06 MED ORDER — LOSARTAN POTASSIUM-HCTZ 100-25 MG PO TABS: 1.0000 | ORAL_TABLET | Freq: Every day | ORAL | 0 refills | Status: DC

## 2016-09-06 MED ORDER — ATORVASTATIN CALCIUM 10 MG PO TABS: 10.0000 mg | ORAL_TABLET | Freq: Every day | ORAL | 0 refills | Status: DC

## 2016-09-06 MED ORDER — METFORMIN HCL 500 MG PO TABS: 500.0000 mg | ORAL_TABLET | Freq: Two times a day (BID) | ORAL | 0 refills | Status: DC

## 2016-09-06 MED ORDER — CARVEDILOL 25 MG PO TABS: 25.0000 mg | ORAL_TABLET | Freq: Two times a day (BID) | ORAL | 0 refills | Status: DC

## 2016-09-06 NOTE — Telephone Encounter (Signed)
Caller name: Albin FellingCarla Relationship to patient: Daughter Can be reached: [phone] Pharmacy:  [facility] [address] - [place], [place] - [address] [phone] (Phone) [phone] (Fax)     Reason for call: Refill metFORMIN (GLUCOPHAGE) 500 MG tablet  atorvastatin (LIPITOR) 10 MG tablet  carvedilol (COREG) 25 MG tablet  losartan-hydrochlorothiazide (HYZAAR) 100-25 MG tablet [[id number]][[id number]] Patient has not had medication in 2 or 3 days.

## 2016-09-06 NOTE — Telephone Encounter (Signed)
Pt overdue for OV. Appt scheduled 09/08/2016. Will send 30 day supply of requested meds.

## 2016-09-08 ENCOUNTER — Ambulatory Visit (INDEPENDENT_AMBULATORY_CARE_PROVIDER_SITE_OTHER): Payer: Medicare Other | Admitting: Internal Medicine

## 2016-09-08 ENCOUNTER — Encounter: Payer: Self-pay | Admitting: Internal Medicine

## 2016-09-08 VITALS — BP 128/74 | HR 81 | Temp 97.6°F | Resp 14 | Ht <= 58 in | Wt 138.2 lb

## 2016-09-08 DIAGNOSIS — E785 Hyperlipidemia, unspecified: Secondary | ICD-10-CM | POA: Diagnosis not present

## 2016-09-08 DIAGNOSIS — H538 Other visual disturbances: Secondary | ICD-10-CM | POA: Diagnosis not present

## 2016-09-08 DIAGNOSIS — E118 Type 2 diabetes mellitus with unspecified complications: Secondary | ICD-10-CM

## 2016-09-08 LAB — LIPID PANEL
CHOL/HDL RATIO: 2
CHOLESTEROL: 135 mg/dL (ref 0–200)
HDL: 54.6 mg/dL (ref >39.00)
LDL Cholesterol: 57 mg/dL (ref 0–99)
NonHDL: 80.48
TRIGLYCERIDES: 119 mg/dL (ref 0.0–149.0)
VLDL: 23.8 mg/dL (ref 0.0–40.0)

## 2016-09-08 MED ORDER — METFORMIN HCL 500 MG PO TABS: 500.0000 mg | ORAL_TABLET | Freq: Two times a day (BID) | ORAL | 6 refills | Status: DC

## 2016-09-08 MED ORDER — LOSARTAN POTASSIUM-HCTZ 100-25 MG PO TABS: 1.0000 | ORAL_TABLET | Freq: Every day | ORAL | 6 refills | Status: DC

## 2016-09-08 MED ORDER — ATORVASTATIN CALCIUM 10 MG PO TABS
10.0000 mg | ORAL_TABLET | Freq: Every day | ORAL | 6 refills | Status: DC
Start: 2016-09-08 — End: 2017-05-05

## 2016-09-08 MED ORDER — CARVEDILOL 25 MG PO TABS: 25.0000 mg | ORAL_TABLET | Freq: Two times a day (BID) | ORAL | 6 refills | Status: DC

## 2016-09-08 MED ORDER — CLONIDINE HCL 0.2 MG PO TABS: 0.2000 mg | ORAL_TABLET | Freq: Three times a day (TID) | ORAL | 6 refills | Status: DC

## 2016-09-08 NOTE — Progress Notes (Signed)
Pre visit review using our clinic review tool, if applicable. No additional management support is needed unless otherwise documented below in the visit note. 

## 2016-09-08 NOTE — Patient Instructions (Signed)
GO TO THE LAB : Get the blood work     GO TO THE FRONT DESK Schedule your next appointment for a  Yearly check up in 4 to 6 months, fasting

## 2016-09-08 NOTE — Progress Notes (Signed)
Subjective:    Patient ID: Alison Graves, female    DOB: 19-May-1932, 83 y.o.   MRN: [id number]  DOS:  09/08/2016 Type of visit - description : rov, Here with her granddaughter Interval history: Since the last time she was here, her visual disturbances resolved, she saw ophthalmology and she was told everything was okay. Good medication compliance. Ambulatory BPs are usually good and she has had dietary indiscretion. Good compliance with metformin, no ambulatory CBGs.   Review of Systems  Occasional dry mouth, denies dry eyes.  Past Medical History:  Diagnosis Date  . Hemorrhoids   . HTN (hypertension)   . Hyperlipemia   . Microhematuria    s/p eval DR Patsi Sears 2004  . Osteoarthritis   . PMR (polymyalgia rheumatica) (HCC)    ?    Past Surgical History:  Procedure Laterality Date  . BREAST BIOPSY  2005   benign  . CATARACT EXTRACTION    . LAPAROSCOPIC HYSTERECTOMY     per Dr Lily Peer, ?oophorectomy    Social History   Social History  . Marital status: Widowed    Spouse name: N/A  . Number of children: 3  . Years of education: N/A   Occupational History  . retired     Social History Main Topics  . Smoking status: Never Smoker  . Smokeless tobacco: Never Used  . Alcohol use No  . Drug use: No  . Sexual activity: Yes    Birth control/ protection: Surgical   Other Topics Concern  . Not on file   Social History Narrative   Widow,  Original from [place] , lives w/ 2 of her 3 daughters      Allergies as of 09/08/2016   No Known Allergies     Medication List       Accurate as of 09/08/16 11:59 PM. Always use your most recent med list.          acetaminophen 650 MG CR tablet Commonly known as:  TYLENOL Take 650 mg by mouth every 8 (eight) hours as needed. Reported on 01/18/2016   aspirin 81 MG tablet Take 81 mg by mouth daily.   atorvastatin 10 MG tablet Commonly known as:  LIPITOR Take 1 tablet (10 mg total) by mouth daily.   CALCIUM 600  + D PO Take 3 tablets by mouth daily.   carvedilol 25 MG tablet Commonly known as:  COREG Take 1 tablet (25 mg total) by mouth 2 (two) times daily with a meal.   cloNIDine 0.2 MG tablet Commonly known as:  CATAPRES Take 1 tablet (0.2 mg total) by mouth 3 (three) times daily.   fish oil-omega-3 fatty acids 1000 MG capsule Take 4 g by mouth daily. Reported on 01/18/2016   losartan-hydrochlorothiazide 100-25 MG tablet Commonly known as:  HYZAAR Take 1 tablet by mouth daily.   metFORMIN 500 MG tablet Commonly known as:  GLUCOPHAGE Take 1 tablet (500 mg total) by mouth 2 (two) times daily with a meal.   MULTIVITAMIN PO Take 1 tablet by mouth daily.   Vitamin D3 5000 units Tabs Take 4 tablets by mouth daily.          Objective:   Physical Exam BP 128/74 (BP Location: Left Arm, Patient Position: Sitting, Cuff Size: Normal)   Pulse 81   Temp 97.6 F (36.4 C) (Oral)   Resp 14   Ht 4\' 10"  (1.473 m)   Wt 138 lb 4 oz (62.7 kg)   SpO2 95%  BMI 28.89 kg/m  General:   Well developed, well nourished . NAD.  HEENT:  Normocephalic . Face symmetric, atraumatic Lungs:  CTA B Normal respiratory effort, no intercostal retractions, no accessory muscle use. Heart: RRR,  no murmur.  No pretibial edema bilaterally  Skin: Not pale. Not jaundice Neurologic:  alert & oriented X3.  Speech normal, gait very difficult due to DJD, needs assistance transferring and walking. Uses a cane. Psych--  Cognition and judgment appear intact.  Cooperative with normal attention span and concentration.  Behavior appropriate. No anxious or depressed appearing.      Assessment & Plan:   Assessment DM HTN Hyperlipidemia LE edema  Microhematuria, urology eval 2004 Osteopenia-- t score -1.1 2011, t score normal 10-2015 DJD - knees H/o PMR  Years ago, saw rheumatology (Dr Coral SpikesLevitin) , dx PMR with normal sed rate- CRP but elevated CK. Responded to steroids  PLAN: DM: Last A1c satisfactory,  continue metformin HTN: Seems control with Hyzaar, clonidine, carvedilol. Last BMP satisfactory. RF meds High cholesterol: Last LDL 122, on Lipitor, check labs, consider adjustments Lipitor. RF meds Visual disturbances: See  last OV, sx self resolved,eye exam was negative per patient RTC 4-6 months

## 2016-09-10 NOTE — Assessment & Plan Note (Signed)
DM: Last A1c satisfactory, continue metformin HTN: Seems control with Hyzaar, clonidine, carvedilol. Last BMP satisfactory. RF meds High cholesterol: Last LDL 122, on Lipitor, check labs, consider adjustments Lipitor. RF meds Visual disturbances: See  last OV, sx self resolved,eye exam was negative per patient RTC 4-6 months

## 2016-10-26 ENCOUNTER — Ambulatory Visit: Payer: Medicare Other | Admitting: Rheumatology

## 2016-11-29 DIAGNOSIS — J209 Acute bronchitis, unspecified: Secondary | ICD-10-CM | POA: Diagnosis not present

## 2017-01-18 NOTE — Progress Notes (Signed)
Subjective:   Alison Graves is a 83 y.o. female who presents for Medicare Annual (Subsequent) preventive examination. Here with daughter Ronda Fairly  Review of Systems:  No ROS.  Medicare Wellness Visit. Additional risk factors are reflected in the social history.  Cardiac Risk Factors include: advanced age (>89men, >50 women);dyslipidemia;hypertension;sedentary lifestyle Sleep patterns:  Sleeps from midnight until 7am. Naps everyday. Home Safety/Smoke Alarms: Feels safe in home. Smoke alarms in place.  Living environment; residence and Firearm Safety: Lives with 2 daughters.  2 story home. Bedroom upstairs. No guns. Seat Belt Safety/Bike Helmet: Wears seat belt.   Counseling:   Eye Exam- Wearing glasses. Only for reading . Dr.Groat every 2 yrs.  Dental- No dentist. Only goes as needed.  Female:   Pap-   Hysterectomy    Mammo- Last 04/19/15: BI-RADS CATEGORY  1: Negative. ORDERED TODAY. Dexa scan- Last 10/14/15: Normal        CCS-No longer doing routine screening due to age.     Objective:     Vitals: BP (!) 156/82 (BP Location: Right Arm, Patient Position: Sitting, Cuff Size: Normal)   Pulse 68   Ht 4' 9.5" (1.461 m)   Wt 135 lb 3.2 oz (61.3 kg)   SpO2 96%   BMI 28.75 kg/m   Body mass index is 28.75 kg/m.   Tobacco History  Smoking Status  . Never Smoker  Smokeless Tobacco  . Never Used     Counseling given: Not Answered   Past Medical History:  Diagnosis Date  . Hemorrhoids   . HTN (hypertension)   . Hyperlipemia   . Microhematuria    s/p eval DR Patsi Sears 2004  . Osteoarthritis   . PMR (polymyalgia rheumatica) (HCC)    ?   Past Surgical History:  Procedure Laterality Date  . BREAST BIOPSY  2005   benign  . CATARACT EXTRACTION    . LAPAROSCOPIC HYSTERECTOMY     per Dr Lily Peer, ?oophorectomy   Family History  Problem Relation Age of Onset  . Coronary artery disease Mother        early in life   . Diabetes Other        grandmother  .  Stroke Neg Hx   . Hypertension Neg Hx   . Cancer Neg Hx    History  Sexual Activity  . Sexual activity: No    Outpatient Encounter Prescriptions as of 01/25/2017  Medication Sig  . acetaminophen (TYLENOL) 650 MG CR tablet Take 650 mg by mouth every 8 (eight) hours as needed. Reported on 01/18/2016  . aspirin 81 MG tablet Take 81 mg by mouth daily.    Marland Kitchen atorvastatin (LIPITOR) 10 MG tablet Take 1 tablet (10 mg total) by mouth daily.  . Calcium Carbonate-Vitamin D (CALCIUM 600 + D PO) Take 3 tablets by mouth daily.   . carvedilol (COREG) 25 MG tablet Take 1 tablet (25 mg total) by mouth 2 (two) times daily with a meal.  . Cholecalciferol (VITAMIN D3) 5000 units TABS Take 4 tablets by mouth daily.  . cloNIDine (CATAPRES) 0.2 MG tablet Take 1 tablet (0.2 mg total) by mouth 3 (three) times daily.  . fish oil-omega-3 fatty acids 1000 MG capsule Take 4 g by mouth daily. Reported on 01/18/2016  . losartan-hydrochlorothiazide (HYZAAR) 100-25 MG tablet Take 1 tablet by mouth daily.  . metFORMIN (GLUCOPHAGE) 500 MG tablet Take 1 tablet (500 mg total) by mouth 2 (two) times daily with a meal.  . Multiple Vitamin (MULTIVITAMIN  PO) Take 1 tablet by mouth daily.    No facility-administered encounter medications on file as of 01/25/2017.     Activities of Daily Living In your present state of health, do you have any difficulty performing the following activities: 01/25/2017  Hearing? N  Vision? N  Difficulty concentrating or making decisions? N  Walking or climbing stairs? Y  Dressing or bathing? N  Doing errands, shopping? Y  Preparing Food and eating ? N  Using the Toilet? N  In the past six months, have you accidently leaked urine? Y  Do you have problems with loss of bowel control? N  Managing your Medications? N  Managing your Finances? N  Housekeeping or managing your Housekeeping? N  Some recent data might be hidden    Patient Care Team: Wanda PlumpPaz, Jose E, MD as PCP - General Ernesto RutherfordGroat, Robert,  MD as Consulting Physician (Ophthalmology) Pollyann Savoyeveshwar, Shaili, MD as Consulting Physician (Rheumatology)    Assessment:    Physical assessment deferred to PCP.  Exercise Activities and Dietary recommendations Current Exercise Habits: The patient does not participate in regular exercise at present, Exercise limited by: None identified Diet (meal preparation, eat out, water intake, caffeinated beverages, dairy products, fruits and vegetables): in general, a "healthy" diet        Goals    . Maintain current health       Fall Risk Fall Risk  01/25/2017 01/18/2016 12/16/2014 11/05/2013 08/09/2012  Falls in the past year? Yes No No No No  Number falls in past yr: 1 - - - -  Follow up Education provided;Falls prevention discussed - - - -   Depression Screen PHQ 2/9 Scores 01/25/2017 01/18/2016 12/16/2014 11/05/2013  PHQ - 2 Score 0 0 0 0     Cognitive Function MMSE - Mini Mental State Exam 01/25/2017  Orientation to time 5  Orientation to Place 5  Registration 3  Attention/ Calculation 5  Recall 3  Language- name 2 objects 2  Language- repeat 1  Language- follow 3 step command 3  Language- read & follow direction 1  Write a sentence 1        Immunization History  Administered Date(s) Administered  . H1N1 07/29/2008  . Influenza Whole 06/13/2007, 04/24/2008, 04/19/2009, 04/06/2010  . Influenza, High Dose Seasonal PF 05/24/2016  . Influenza, Seasonal, Injecte, Preservative Fre 05/02/2013  . Influenza,inj,Quad PF,36+ Mos 06/11/2014  . Pneumococcal Conjugate-13 11/05/2013  . Pneumococcal Polysaccharide-23 07/29/2008  . Td 08/17/2009  . Zoster 09/25/2008   Screening Tests Health Maintenance  Topic Date Due  . FOOT EXAM  11/05/1941  . OPHTHALMOLOGY EXAM  11/05/1941  . HEMOGLOBIN A1C  11/21/2016  . INFLUENZA VACCINE  01/31/2017  . TETANUS/TDAP  08/18/2019  . DEXA SCAN  Completed  . PNA vac Low Risk Adult  Completed      Plan:  Follow up with Dr.Paz today as  scheduled.  Continue to eat heart healthy diet (full of fruits, vegetables, whole grains, lean protein, water--limit salt, fat, and sugar intake) and increase physical activity as tolerated.  Continue doing brain stimulating activities (puzzles, reading, adult coloring books, staying active) to keep memory sharp.   Mammogram ordered. They will call you to schedule appointment.  I have personally reviewed and noted the following in the patient's chart:   . Medical and social history . Use of alcohol, tobacco or illicit drugs  . Current medications and supplements . Functional ability and status . Nutritional status . Physical activity . Advanced directives . List  of other physicians . Hospitalizations, surgeries, and ER visits in previous 12 months . Vitals . Screenings to include cognitive, depression, and falls . Referrals and appointments  In addition, I have reviewed and discussed with patient certain preventive protocols, quality metrics, and best practice recommendations. A written personalized care plan for preventive services as well as general preventive health recommendations were provided to patient.     [place]ter[place], Franta [place], [place]  01/25/2017   Willow Ora, MD

## 2017-01-25 ENCOUNTER — Other Ambulatory Visit: Payer: Medicare Other

## 2017-01-25 ENCOUNTER — Ambulatory Visit (INDEPENDENT_AMBULATORY_CARE_PROVIDER_SITE_OTHER): Payer: Medicare Other | Admitting: Internal Medicine

## 2017-01-25 ENCOUNTER — Encounter: Payer: Self-pay | Admitting: Internal Medicine

## 2017-01-25 VITALS — BP 156/82 | HR 68 | Ht <= 58 in | Wt 135.2 lb

## 2017-01-25 DIAGNOSIS — E118 Type 2 diabetes mellitus with unspecified complications: Secondary | ICD-10-CM

## 2017-01-25 DIAGNOSIS — E119 Type 2 diabetes mellitus without complications: Secondary | ICD-10-CM | POA: Insufficient documentation

## 2017-01-25 DIAGNOSIS — R799 Abnormal finding of blood chemistry, unspecified: Secondary | ICD-10-CM | POA: Diagnosis not present

## 2017-01-25 DIAGNOSIS — E785 Hyperlipidemia, unspecified: Secondary | ICD-10-CM

## 2017-01-25 DIAGNOSIS — H9193 Unspecified hearing loss, bilateral: Secondary | ICD-10-CM | POA: Diagnosis not present

## 2017-01-25 DIAGNOSIS — Z1231 Encounter for screening mammogram for malignant neoplasm of breast: Secondary | ICD-10-CM

## 2017-01-25 DIAGNOSIS — Z0000 Encounter for general adult medical examination without abnormal findings: Secondary | ICD-10-CM

## 2017-01-25 DIAGNOSIS — I10 Essential (primary) hypertension: Secondary | ICD-10-CM | POA: Diagnosis not present

## 2017-01-25 LAB — CBC WITH DIFFERENTIAL/PLATELET
BASOS PCT: 0.9 % (ref 0.0–3.0)
Basophils Absolute: 0.1 10*3/uL (ref 0.0–0.1)
EOS ABS: 0.2 10*3/uL (ref 0.0–0.7)
EOS PCT: 3 % (ref 0.0–5.0)
HEMATOCRIT: 40.9 % (ref 36.0–46.0)
HEMOGLOBIN: 13.6 g/dL (ref 12.0–15.0)
LYMPHS PCT: 32.8 % (ref 12.0–46.0)
Lymphs Abs: 2.2 10*3/uL (ref 0.7–4.0)
MCHC: 33.2 g/dL (ref 30.0–36.0)
MCV: 93.3 fl (ref 78.0–100.0)
Monocytes Absolute: 0.4 10*3/uL (ref 0.1–1.0)
Monocytes Relative: 5.7 % (ref 3.0–12.0)
Neutro Abs: 3.9 10*3/uL (ref 1.4–7.7)
Neutrophils Relative %: 57.6 % (ref 43.0–77.0)
Platelets: 233 10*3/uL (ref 150.0–400.0)
RBC: 4.38 Mil/uL (ref 3.87–5.11)
RDW: 13.7 % (ref 11.5–15.5)
WBC: 6.8 10*3/uL (ref 4.0–10.5)

## 2017-01-25 LAB — BASIC METABOLIC PANEL
BUN: 17 mg/dL (ref 6–23)
CO2: 31 mEq/L (ref 19–32)
CREATININE: 0.91 mg/dL (ref 0.40–1.20)
Calcium: 10.2 mg/dL (ref 8.4–10.5)
Chloride: 101 mEq/L (ref 96–112)
GFR: 62.72 mL/min (ref >60.00)
Glucose, Bld: 128 mg/dL — ABNORMAL HIGH (ref 70–99)
Potassium: 4 mEq/L (ref 3.5–5.1)
Sodium: 138 mEq/L (ref 135–145)

## 2017-01-25 LAB — ALT: ALT: 16 U/L (ref 0–35)

## 2017-01-25 LAB — AST: AST: 20 U/L (ref 0–37)

## 2017-01-25 NOTE — Assessment & Plan Note (Signed)
DM: Continue metformin, check A1c HTN: BP today slightly elevated, rec ambulatory BPs. Continue carvedilol, Catapres, Hyzaar. Check a BMP and CBC. High cholesterol: Continue Lipitor, check LFTs. Nosebleeds: Recommend Vaseline at night, avoid "picking" at the nose. Call if symptoms persistent. HOH: Refer to audiology. RTC 5 months.

## 2017-01-25 NOTE — Progress Notes (Signed)
Subjective:    Patient ID: Alison Graves, female    DOB: 07/01/1932, 83 y.o.   MRN: [id number]  DOS:  01/25/2017 Type of visit - description : rov, here w/ one of her daughters Interval history: DM: No ambulatory CBGs. Takes medications correctly. HTN: BP slightly elevated today, reports she takes her BPs at home and it "fluctuates". Could not tell me exact readings HOH: For a few months, HOH is affecting to some extent her quality of life. Complaining of steady runny nose, clear discharge for several months. Has not used any medication. Occasionally has sneezing, and watery eyes. Also, she frequently tries to clean her  distal nstrils and noticed bleeding from the L side. The bleeding is mild and easily stops.  Review of Systems   Past Medical History:  Diagnosis Date  . Hemorrhoids   . HTN (hypertension)   . Hyperlipemia   . Microhematuria    s/p eval DR Patsi Searsannenbaum 2004  . Osteoarthritis   . PMR (polymyalgia rheumatica) (HCC)    ?    Past Surgical History:  Procedure Laterality Date  . BREAST BIOPSY  2005   benign  . CATARACT EXTRACTION    . LAPAROSCOPIC HYSTERECTOMY     per Dr Lily PeerFernandez, ?oophorectomy    Social History   Social History  . Marital status: Widowed    Spouse name: N/A  . Number of children: 3  . Years of education: N/A   Occupational History  . retired     Social History Main Topics  . Smoking status: Never Smoker  . Smokeless tobacco: Never Used  . Alcohol use No  . Drug use: No  . Sexual activity: No   Other Topics Concern  . Not on file   Social History Narrative   Widow,  [place] from [place] , lives w/ 2 of her 3 daughters      Allergies as of 01/25/2017   No Known Allergies     Medication List       Accurate as of 01/25/17  7:57 PM. Always use your most recent med list.          acetaminophen 650 MG CR tablet Commonly known as:  TYLENOL Take 650 mg by mouth every 8 (eight) hours as needed. Reported on 01/18/2016   aspirin 81 MG tablet Take 81 mg by mouth daily.   atorvastatin 10 MG tablet Commonly known as:  LIPITOR Take 1 tablet (10 mg total) by mouth daily.   CALCIUM 600 + D PO Take 3 tablets by mouth daily.   carvedilol 25 MG tablet Commonly known as:  COREG Take 1 tablet (25 mg total) by mouth 2 (two) times daily with a meal.   cloNIDine 0.2 MG tablet Commonly known as:  CATAPRES Take 1 tablet (0.2 mg total) by mouth 3 (three) times daily.   fish oil-omega-3 fatty acids 1000 MG capsule Take 4 g by mouth daily. Reported on 01/18/2016   losartan-hydrochlorothiazide 100-25 MG tablet Commonly known as:  HYZAAR Take 1 tablet by mouth daily.   metFORMIN 500 MG tablet Commonly known as:  GLUCOPHAGE Take 1 tablet (500 mg total) by mouth 2 (two) times daily with a meal.   MULTIVITAMIN PO Take 1 tablet by mouth daily.   Vitamin D3 5000 units Tabs Take 4 tablets by mouth daily.          Objective:   Physical Exam BP (!) 156/82 (BP Location: Right Arm, Patient Position: Sitting, Cuff Size: Normal)  Pulse 68   Ht 4' 9.5" (1.461 m)   Wt 135 lb 3.2 oz (61.3 kg)   SpO2 96%   BMI 28.75 kg/m  General:   Well developed, well nourished . NAD.  HEENT:  Normocephalic . Face symmetric, atraumatic. TMs: Normal, no mass noted. Nostrils: Normal to inspection. No active bleeding. Lungs:  CTA B Normal respiratory effort, no intercostal retractions, no accessory muscle use. Heart: RRR,  no murmur.  No pretibial edema bilaterally  Skin: Not pale. Not jaundice Neurologic:  alert & oriented X3.  Speech normal, gait limited by DJD, assisted by a cane Psych--  Cognition and judgment appear intact.  Cooperative with normal attention span and concentration.  Behavior appropriate. No anxious or depressed appearing.      Assessment & Plan:    Assessment DM HTN Hyperlipidemia LE edema  Microhematuria, urology eval 2004 Osteopenia-- t score -1.1 2011, t score normal 10-2015 DJD -  knees H/o PMR  Years ago, saw rheumatology (Dr Coral SpikesLevitin) , dx PMR with normal sed rate- CRP but elevated CK. Responded to steroids  PLAN: DM: Continue metformin, check A1c HTN: BP today slightly elevated, rec ambulatory BPs. Continue carvedilol, Catapres, Hyzaar. Check a BMP and CBC. High cholesterol: Continue Lipitor, check LFTs. Nosebleeds: Recommend Vaseline at night, avoid "picking" at the nose. Call if symptoms persistent. HOH: Refer to audiology. RTC 5 months.

## 2017-01-25 NOTE — Patient Instructions (Addendum)
GO TO THE LAB : Get the blood work     GO TO THE FRONT DESK Schedule your next appointment for a  Check up in 5 months   Over-the-counter Flonase 2 sprays on his side of the nose for nasal congestion. If you are not better in the next few weeks let me know  Use Vaseline a small amount in the nose at night If you continue seeing nosebleeds let me know    Check the  blood pressure 2 or 3 times a month   Be sure your blood pressure is between 110/65 and  145/85. If it is consistently higher or lower, let me know      Alison Graves , Thank you for taking time to come for your Medicare Wellness Visit. I appreciate your ongoing commitment to your health goals. Please review the following plan we discussed and let me know if I can assist you in the future.   These are the goals we discussed: Goals    . Maintain current health        This is a list of the screening recommended for you and due dates:  Health Maintenance  Topic Date Due  . Complete foot exam   11/05/1941  . Eye exam for diabetics  11/05/1941  . Hemoglobin A1C  11/21/2016  . Flu Shot  01/31/2017  . Tetanus Vaccine  08/18/2019  . DEXA scan (bone density measurement)  Completed  . Pneumonia vaccines  Completed   Continue to eat heart healthy diet (full of fruits, vegetables, whole grains, lean protein, water--limit salt, fat, and sugar intake) and increase physical activity as tolerated.  Continue doing brain stimulating activities (puzzles, reading, adult coloring books, staying active) to keep memory sharp.   Mammogram ordered. They will call you to schedule appointment.   Health Maintenance for Postmenopausal Women Menopause is a normal process in which your reproductive ability comes to an end. This process happens gradually over a span of months to years, usually between the ages of 8 and 2. Menopause is complete when you have missed 12 consecutive menstrual periods. It is important to talk with your health  care provider about some of the most common conditions that affect postmenopausal women, such as heart disease, cancer, and bone loss (osteoporosis). Adopting a healthy lifestyle and getting preventive care can help to promote your health and wellness. Those actions can also lower your chances of developing some of these common conditions. What should I know about menopause? During menopause, you may experience a number of symptoms, such as:  Moderate-to-severe hot flashes.  Night sweats.  Decrease in sex drive.  Mood swings.  Headaches.  Tiredness.  Irritability.  Memory problems.  Insomnia.  Choosing to treat or not to treat menopausal changes is an individual decision that you make with your health care provider. What should I know about hormone replacement therapy and supplements? Hormone therapy products are effective for treating symptoms that are associated with menopause, such as hot flashes and night sweats. Hormone replacement carries certain risks, especially as you become older. If you are thinking about using estrogen or estrogen with progestin treatments, discuss the benefits and risks with your health care provider. What should I know about heart disease and stroke? Heart disease, heart attack, and stroke become more likely as you age. This may be due, in part, to the hormonal changes that your body experiences during menopause. These can affect how your body processes dietary fats, triglycerides, and cholesterol. Heart attack  and stroke are both medical emergencies. There are many things that you can do to help prevent heart disease and stroke:  Have your blood pressure checked at least every 1-2 years. High blood pressure causes heart disease and increases the risk of stroke.  If you are 34-14 years old, ask your health care provider if you should take aspirin to prevent a heart attack or a stroke.  Do not use any tobacco products, including cigarettes, chewing  tobacco, or electronic cigarettes. If you need help quitting, ask your health care provider.  It is important to eat a healthy diet and maintain a healthy weight. ? Be sure to include plenty of vegetables, fruits, low-fat dairy products, and lean protein. ? Avoid eating foods that are high in solid fats, added sugars, or salt (sodium).  Get regular exercise. This is one of the most important things that you can do for your health. ? Try to exercise for at least 150 minutes each week. The type of exercise that you do should increase your heart rate and make you sweat. This is known as moderate-intensity exercise. ? Try to do strengthening exercises at least twice each week. Do these in addition to the moderate-intensity exercise.  Know your numbers.Ask your health care provider to check your cholesterol and your blood glucose. Continue to have your blood tested as directed by your health care provider.  What should I know about cancer screening? There are several types of cancer. Take the following steps to reduce your risk and to catch any cancer development as early as possible. Breast Cancer  Practice breast self-awareness. ? This means understanding how your breasts normally appear and feel. ? It also means doing regular breast self-exams. Let your health care provider know about any changes, no matter how small.  If you are 80 or older, have a clinician do a breast exam (clinical breast exam or CBE) every year. Depending on your age, family history, and medical history, it may be recommended that you also have a yearly breast X-ray (mammogram).  If you have a family history of breast cancer, talk with your health care provider about genetic screening.  If you are at high risk for breast cancer, talk with your health care provider about having an MRI and a mammogram every year.  Breast cancer (BRCA) gene test is recommended for women who have family members with BRCA-related cancers.  Results of the assessment will determine the need for genetic counseling and BRCA1 and for BRCA2 testing. BRCA-related cancers include these types: ? Breast. This occurs in males or females. ? Ovarian. ? Tubal. This may also be called fallopian tube cancer. ? Cancer of the abdominal or pelvic lining (peritoneal cancer). ? Prostate. ? Pancreatic.  Cervical, Uterine, and Ovarian Cancer Your health care provider may recommend that you be screened regularly for cancer of the pelvic organs. These include your ovaries, uterus, and vagina. This screening involves a pelvic exam, which includes checking for microscopic changes to the surface of your cervix (Pap test).  For women ages 21-65, health care providers may recommend a pelvic exam and a Pap test every three years. For women ages 8-65, they may recommend the Pap test and pelvic exam, combined with testing for human papilloma virus (HPV), every five years. Some types of HPV increase your risk of cervical cancer. Testing for HPV may also be done on women of any age who have unclear Pap test results.  Other health care providers may not recommend any  screening for nonpregnant women who are considered low risk for pelvic cancer and have no symptoms. Ask your health care provider if a screening pelvic exam is right for you.  If you have had past treatment for cervical cancer or a condition that could lead to cancer, you need Pap tests and screening for cancer for at least 20 years after your treatment. If Pap tests have been discontinued for you, your risk factors (such as having a new sexual partner) need to be reassessed to determine if you should start having screenings again. Some women have medical problems that increase the chance of getting cervical cancer. In these cases, your health care provider may recommend that you have screening and Pap tests more often.  If you have a family history of uterine cancer or ovarian cancer, talk with your  health care provider about genetic screening.  If you have vaginal bleeding after reaching menopause, tell your health care provider.  There are currently no reliable tests available to screen for ovarian cancer.  Lung Cancer Lung cancer screening is recommended for adults 17-68 years old who are at high risk for lung cancer because of a history of smoking. A yearly low-dose CT scan of the lungs is recommended if you:  Currently smoke.  Have a history of at least 30 pack-years of smoking and you currently smoke or have quit within the past 15 years. A pack-year is smoking an average of one pack of cigarettes per day for one year.  Yearly screening should:  Continue until it has been 15 years since you quit.  Stop if you develop a health problem that would prevent you from having lung cancer treatment.  Colorectal Cancer  This type of cancer can be detected and can often be prevented.  Routine colorectal cancer screening usually begins at age 52 and continues through age 83.  If you have risk factors for colon cancer, your health care provider may recommend that you be screened at an earlier age.  If you have a family history of colorectal cancer, talk with your health care provider about genetic screening.  Your health care provider may also recommend using home test kits to check for hidden blood in your stool.  A small camera at the end of a tube can be used to examine your colon directly (sigmoidoscopy or colonoscopy). This is done to check for the earliest forms of colorectal cancer.  Direct examination of the colon should be repeated every 5-10 years until age 52. However, if early forms of precancerous polyps or small growths are found or if you have a family history or genetic risk for colorectal cancer, you may need to be screened more often.  Skin Cancer  Check your skin from head to toe regularly.  Monitor any moles. Be sure to tell your health care provider: ? About  any new moles or changes in moles, especially if there is a change in a mole's shape or color. ? If you have a mole that is larger than the size of a pencil eraser.  If any of your family members has a history of skin cancer, especially at a young age, talk with your health care provider about genetic screening.  Always use sunscreen. Apply sunscreen liberally and repeatedly throughout the day.  Whenever you are outside, protect yourself by wearing long sleeves, pants, a wide-brimmed hat, and sunglasses.  What should I know about osteoporosis? Osteoporosis is a condition in which bone destruction happens more quickly than  new bone creation. After menopause, you may be at an increased risk for osteoporosis. To help prevent osteoporosis or the bone fractures that can happen because of osteoporosis, the following is recommended:  If you are 13-85 years old, get at least 1,000 mg of calcium and at least 600 mg of vitamin D per day.  If you are older than age 65 but younger than age 43, get at least 1,200 mg of calcium and at least 600 mg of vitamin D per day.  If you are older than age 61, get at least 1,200 mg of calcium and at least 800 mg of vitamin D per day.  Smoking and excessive alcohol intake increase the risk of osteoporosis. Eat foods that are rich in calcium and vitamin D, and do weight-bearing exercises several times each week as directed by your health care provider. What should I know about how menopause affects my mental health? Depression may occur at any age, but it is more common as you become older. Common symptoms of depression include:  Low or sad mood.  Changes in sleep patterns.  Changes in appetite or eating patterns.  Feeling an overall lack of motivation or enjoyment of activities that you previously enjoyed.  Frequent crying spells.  Talk with your health care provider if you think that you are experiencing depression. What should I know about  immunizations? It is important that you get and maintain your immunizations. These include:  Tetanus, diphtheria, and pertussis (Tdap) booster vaccine.  Influenza every year before the flu season begins.  Pneumonia vaccine.  Shingles vaccine.  Your health care provider may also recommend other immunizations. This information is not intended to replace advice given to you by your health care provider. Make sure you discuss any questions you have with your health care provider. Document Released: 08/11/2005 Document Revised: 01/07/2016 Document Reviewed: 03/23/2015 Elsevier Interactive Patient Education  2018 [employer].

## 2017-01-26 ENCOUNTER — Ambulatory Visit: Payer: Medicare Other | Admitting: Internal Medicine

## 2017-01-26 LAB — HEMOGLOBIN A1C
HEMOGLOBIN A1C: 6.8 % — AB (ref <5.7)
Mean Plasma Glucose: 148 mg/dL

## 2017-01-30 ENCOUNTER — Telehealth: Payer: Self-pay | Admitting: Rheumatology

## 2017-01-30 ENCOUNTER — Other Ambulatory Visit: Payer: Self-pay | Admitting: Internal Medicine

## 2017-01-30 DIAGNOSIS — Z1231 Encounter for screening mammogram for malignant neoplasm of breast: Secondary | ICD-10-CM

## 2017-01-30 NOTE — Telephone Encounter (Signed)
Patientts daughter calling wanting to know where Dr. Corliss Skainseveshwar wanted patient to go to for a consult back in October 2017. Patient did not go, and daughter would like the information now. Please call to advise.

## 2017-02-01 NOTE — Telephone Encounter (Signed)
Ambulatory referral to Orthopedic Surgery/ for severe OA knees.  I have advised her. If she wants to see ortho, she can just call for appt, should not need a referral. Left message.

## 2017-02-09 ENCOUNTER — Ambulatory Visit
Admission: RE | Admit: 2017-02-09 | Discharge: 2017-02-09 | Disposition: A | Discharge status: Home / Self Care | Payer: Medicare Other | Source: Ambulatory Visit | Attending: Internal Medicine | Admitting: Internal Medicine

## 2017-02-09 DIAGNOSIS — Z1231 Encounter for screening mammogram for malignant neoplasm of breast: Secondary | ICD-10-CM | POA: Diagnosis not present

## 2017-04-01 IMAGING — CR DG CHEST 2V
2 series · 2 of 2 positions shown · non-contrast
Comparison: Chest radiograph dated 12/16/2014

CLINICAL DATA: 84-year-old female with fall and right breast area.

EXAM:
CHEST  2 VIEW

[w chest pa]
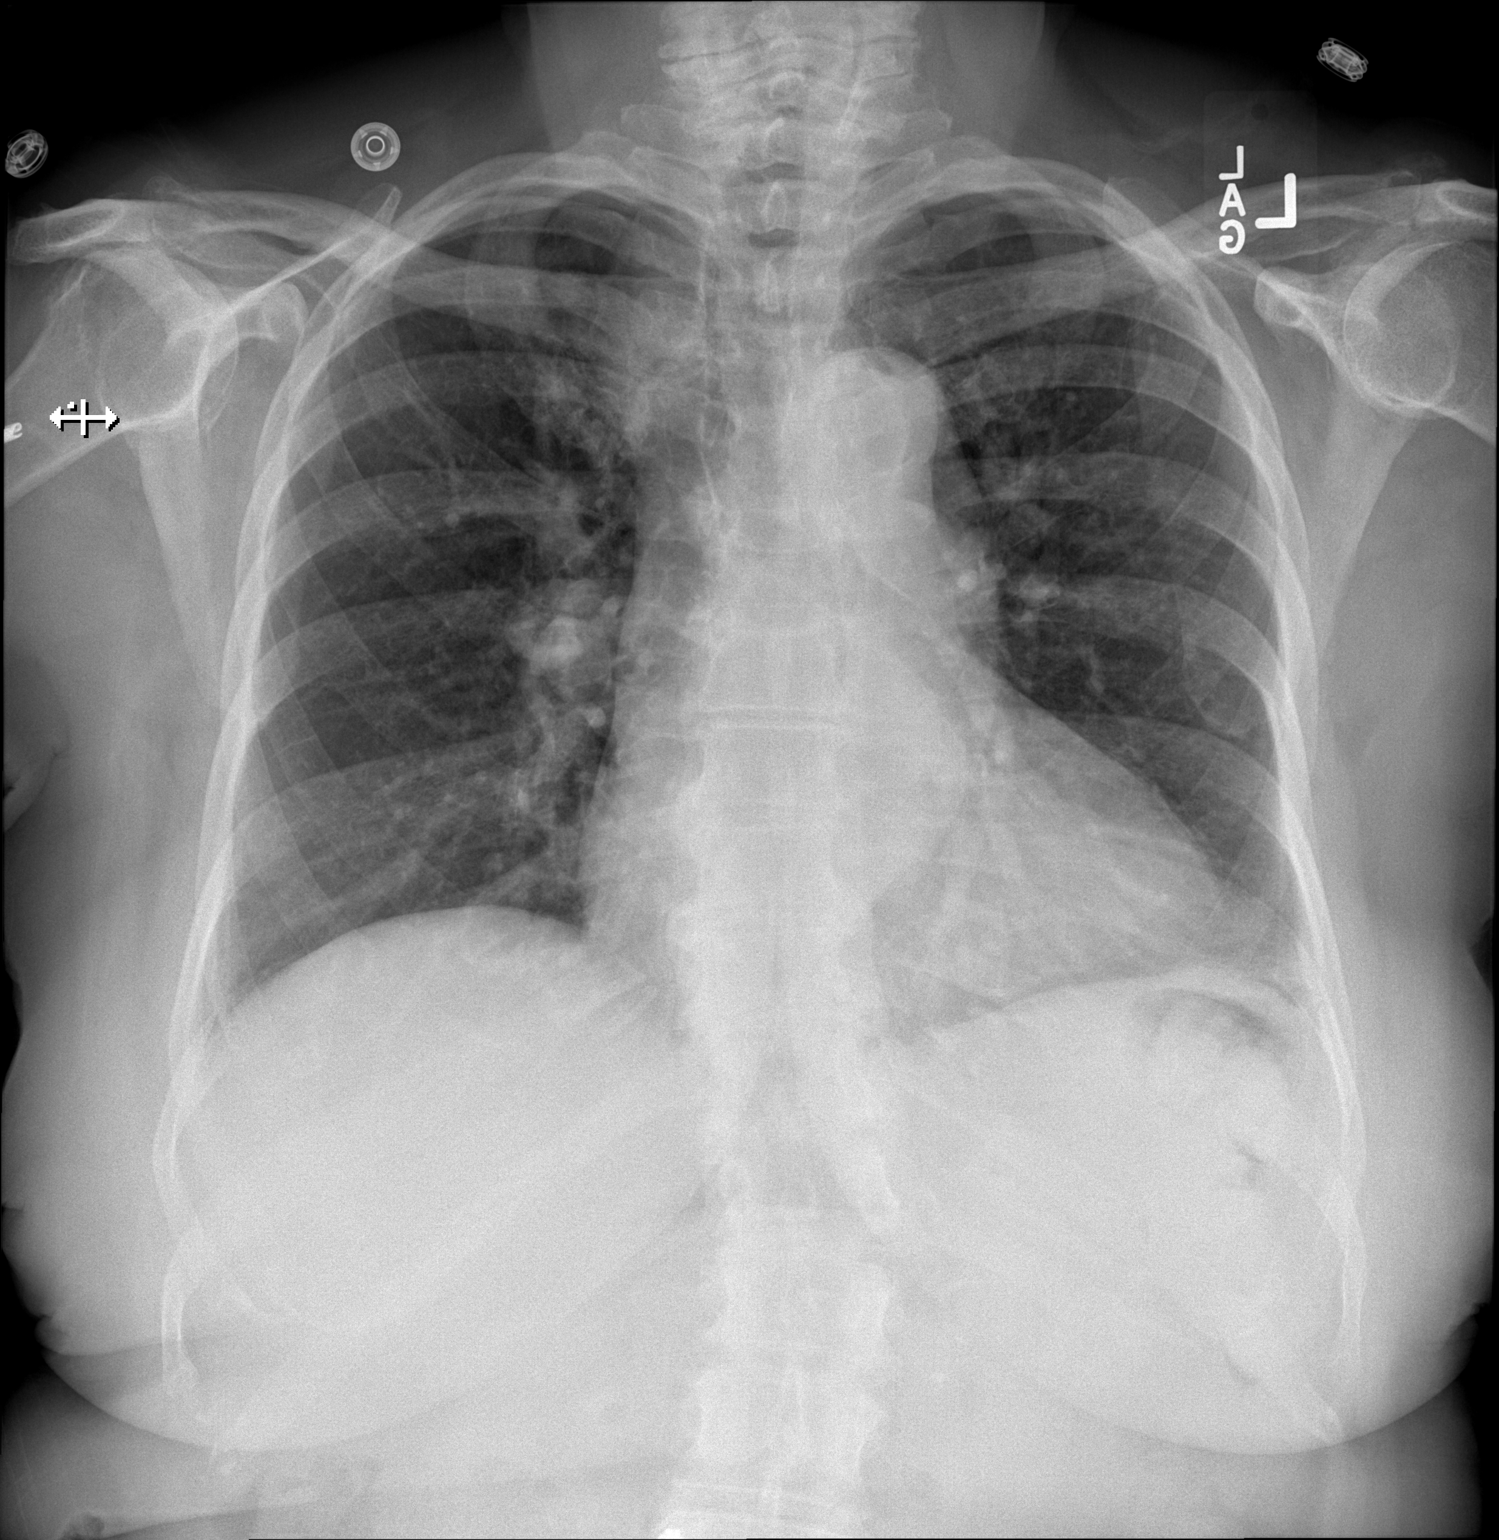

[w chest lat]
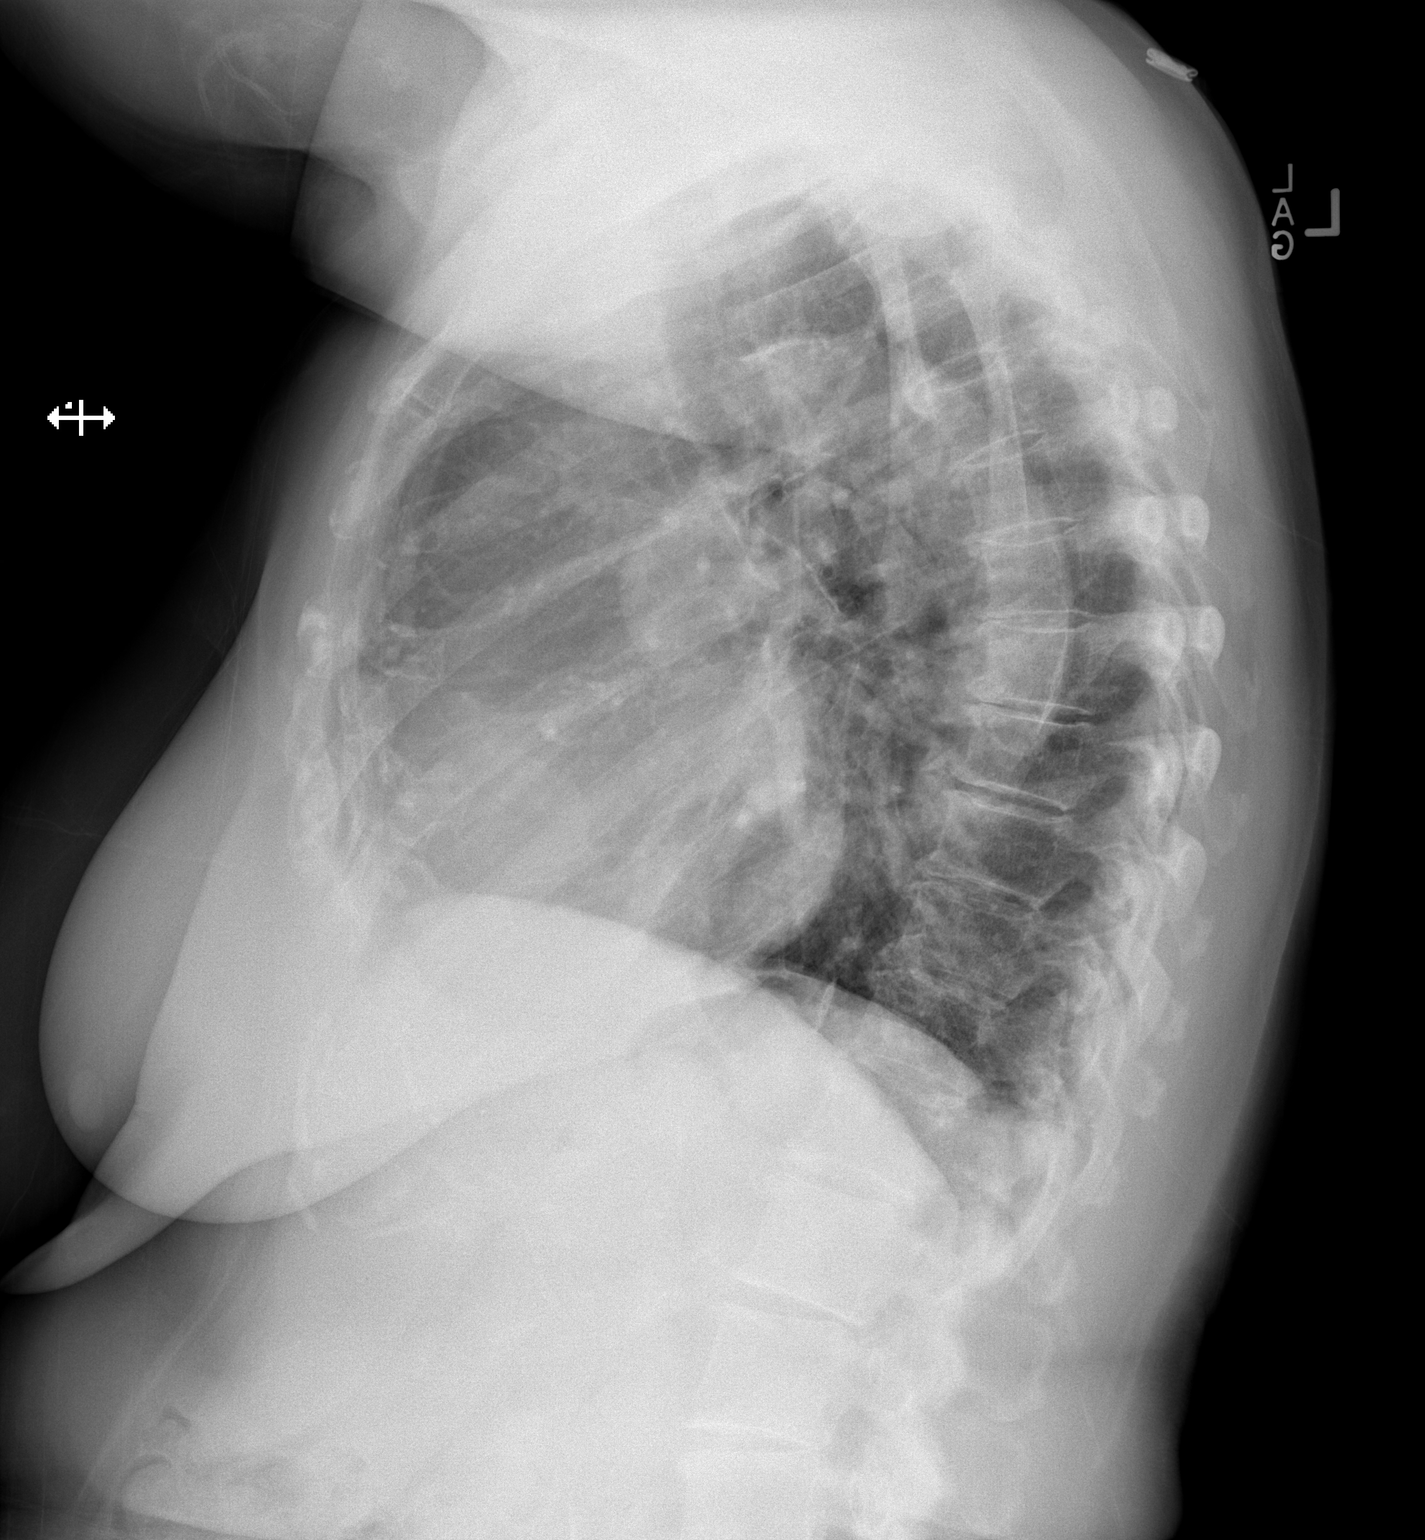

[2 of 2 positions shown; findings below may reference images not displayed]

FINDINGS: Top-normal cardiac size mild central vascular prominent. Minimal
bibasilar atelectatic changes. No focal consolidation, pleural
effusion, or pneumothorax. There is mild scoliosis with degenerative
changes of the spine. No acute fracture.
IMPRESSION: Mild central vascular congestion. No focal consolidation or
pneumothorax.

## 2017-04-01 IMAGING — CR DG KNEE COMPLETE 4+V*R*
4 series · 4 of 4 positions shown · non-contrast
Comparison: None.

CLINICAL DATA: Lost balance and fell into flower bed. Right knee
pain.

EXAM:
RIGHT KNEE - COMPLETE 4+ VIEW

[t knee ap right]
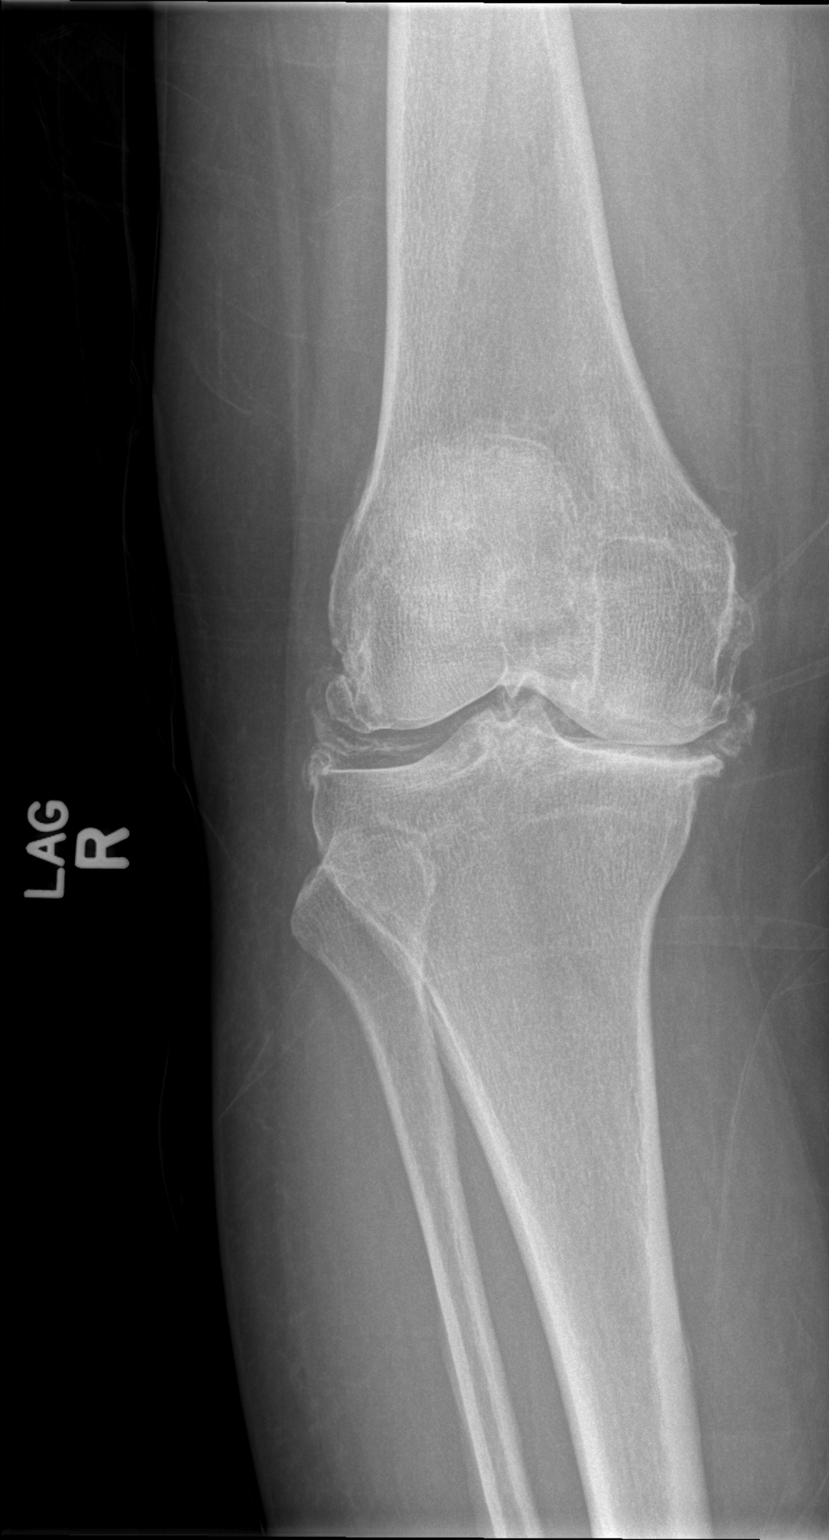

[t knee obl right (1 of 2)]
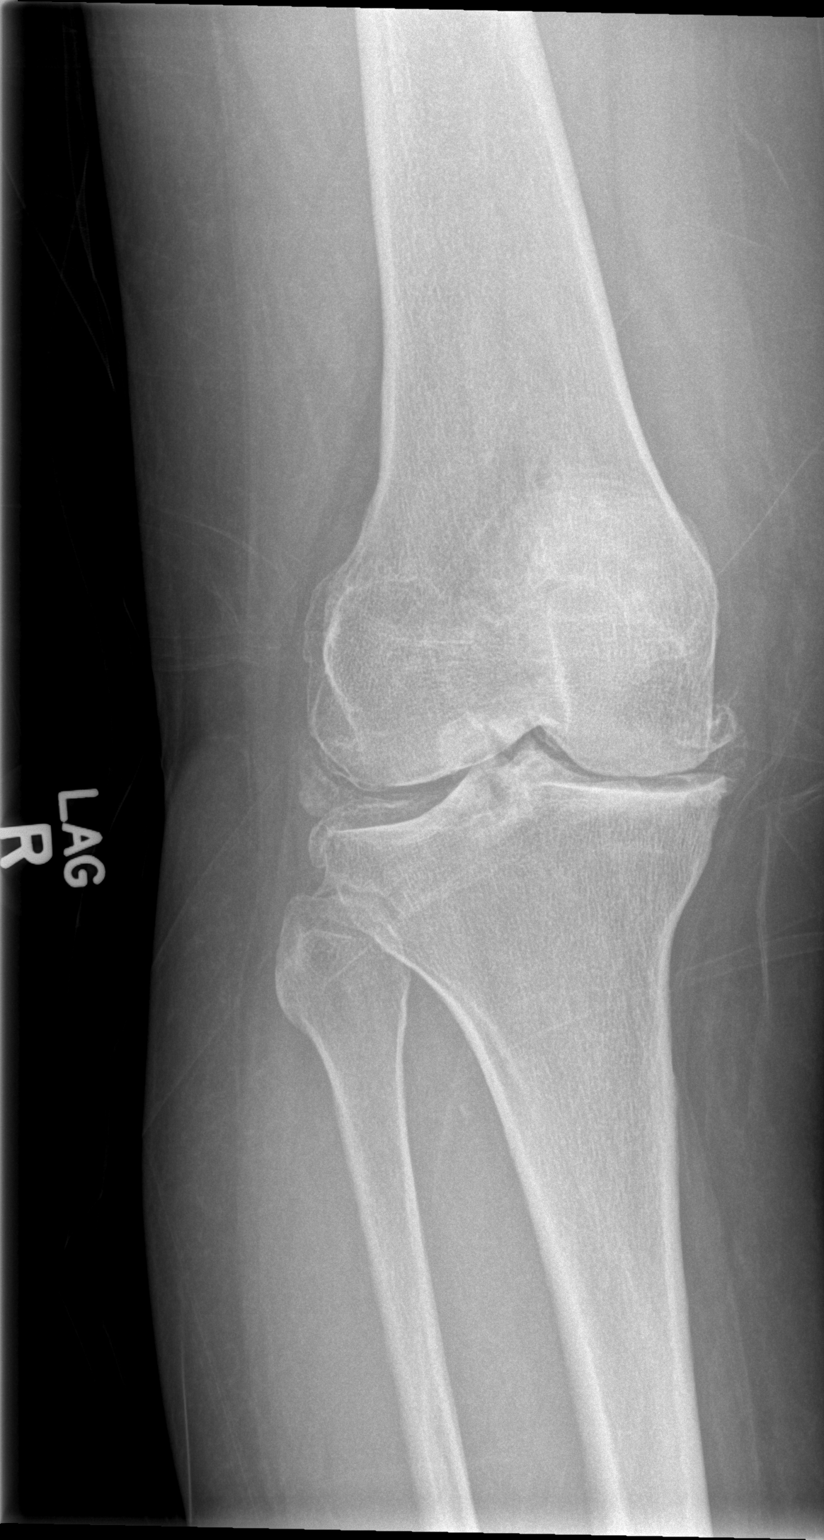

[t knee obl right (2 of 2)]
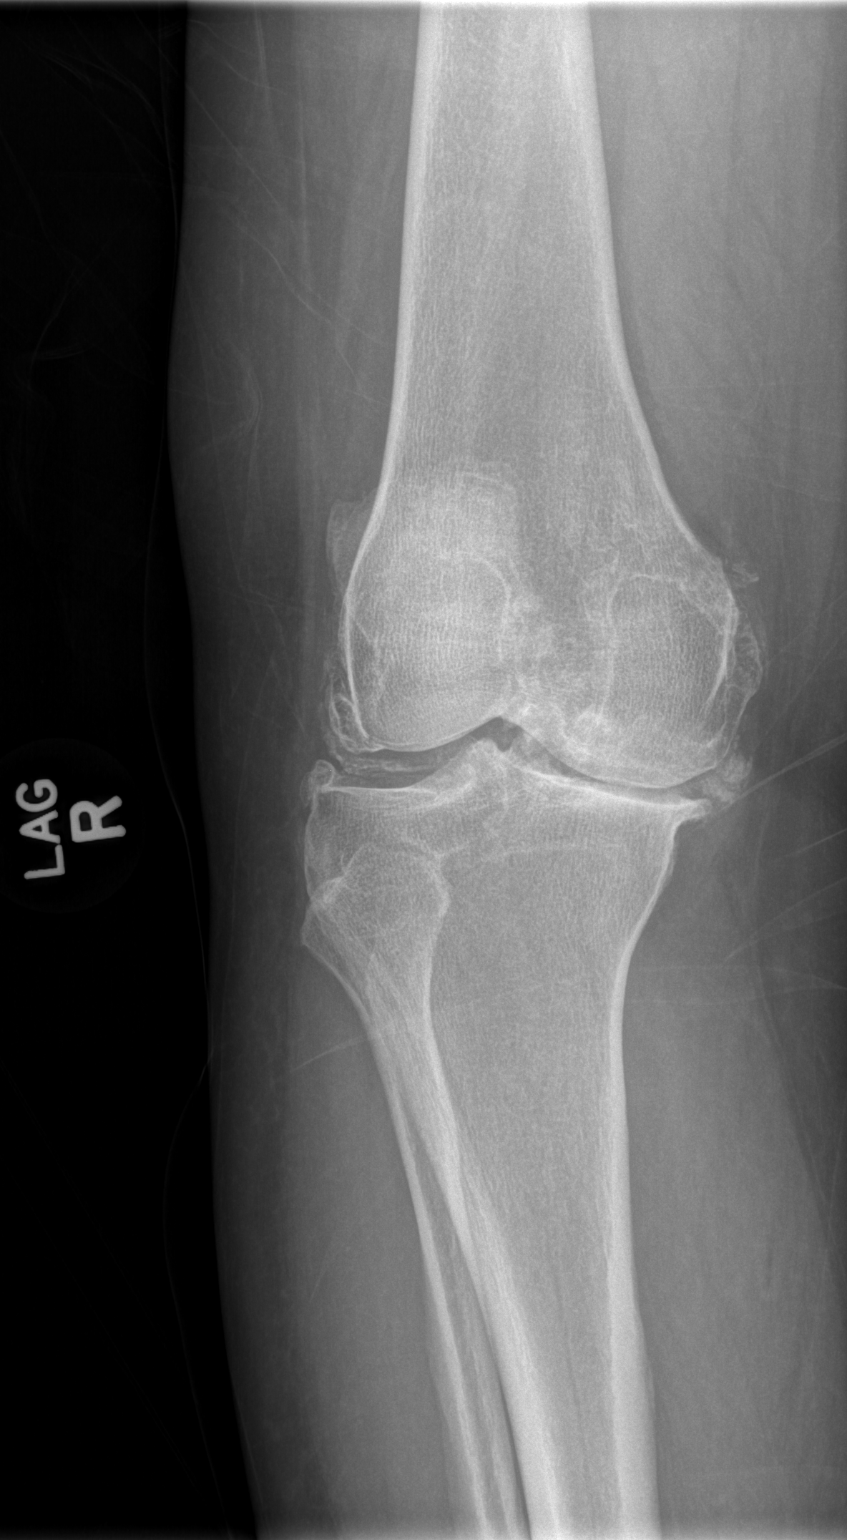

[t knee lat right]
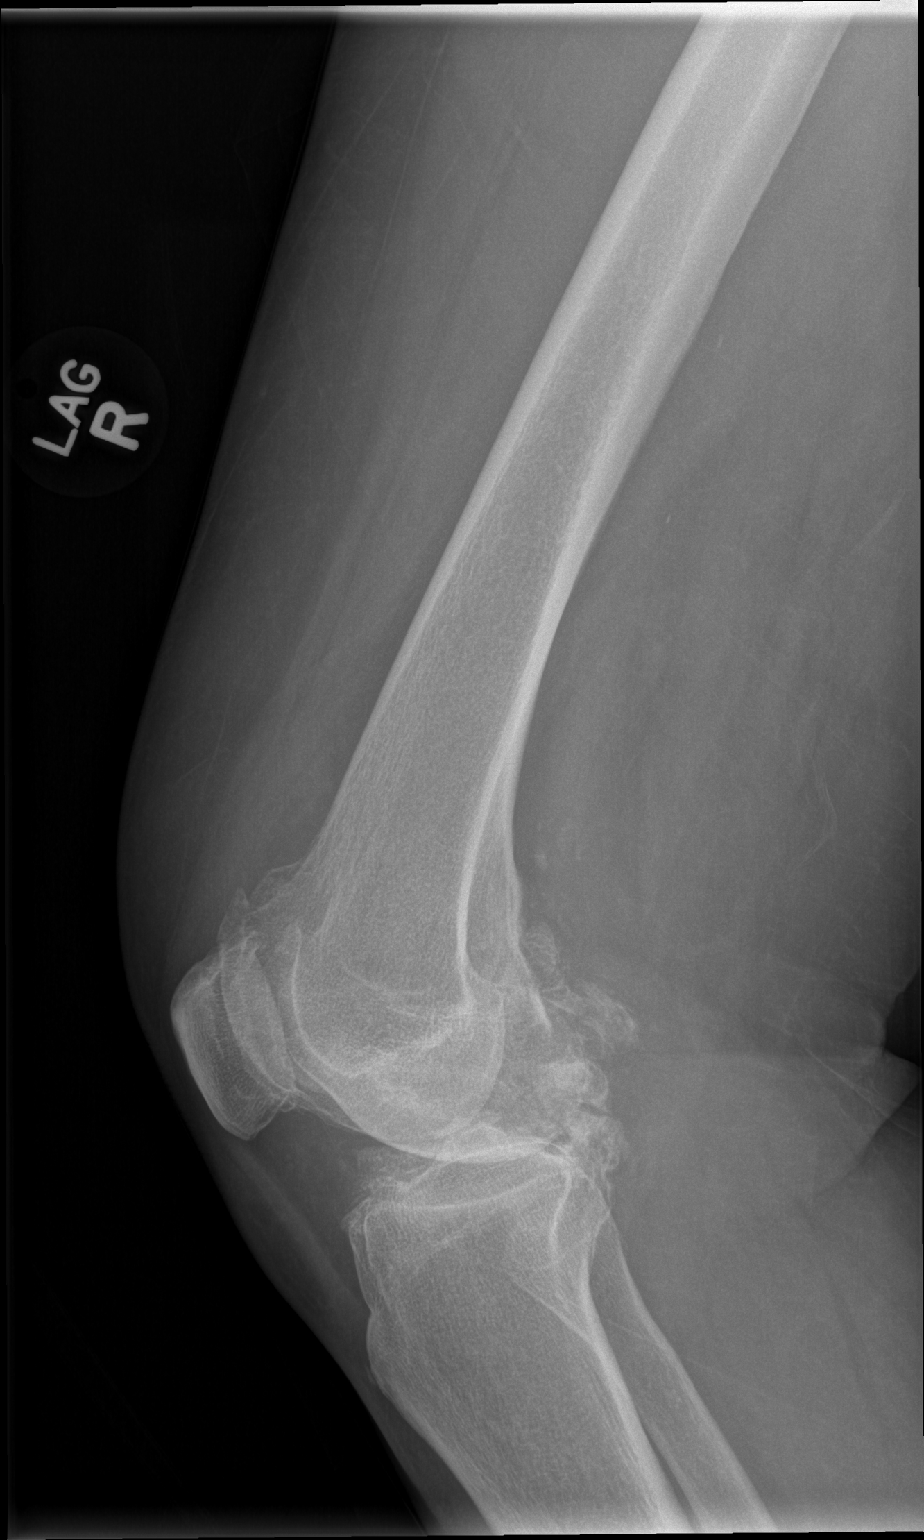

[4 of 4 positions shown; findings below may reference images not displayed]

FINDINGS: No acute fracture or subluxation. Moderate to advanced
tricompartmental osteoarthritis most prominent in the medial
compartment. Chondrocalcinosis of the menisci. No definite joint
effusion. Capsular calcification on probable intra-articular bodies
posteriorly.
IMPRESSION: 1. No acute fracture or subluxation of the right knee.
2. Moderate to advanced tricompartmental degenerative change.

## 2017-05-05 ENCOUNTER — Other Ambulatory Visit: Payer: Self-pay | Admitting: Internal Medicine

## 2017-05-23 ENCOUNTER — Ambulatory Visit: Payer: Medicare Other | Attending: Internal Medicine | Admitting: Audiology

## 2017-05-23 DIAGNOSIS — H918X9 Other specified hearing loss, unspecified ear: Secondary | ICD-10-CM | POA: Insufficient documentation

## 2017-05-23 DIAGNOSIS — H93299 Other abnormal auditory perceptions, unspecified ear: Secondary | ICD-10-CM | POA: Diagnosis not present

## 2017-05-23 DIAGNOSIS — R2681 Unsteadiness on feet: Secondary | ICD-10-CM | POA: Insufficient documentation

## 2017-05-23 DIAGNOSIS — H9190 Unspecified hearing loss, unspecified ear: Secondary | ICD-10-CM

## 2017-05-23 DIAGNOSIS — H903 Sensorineural hearing loss, bilateral: Secondary | ICD-10-CM | POA: Insufficient documentation

## 2017-05-23 NOTE — Procedures (Signed)
[facility]  [address]  [place], [place] [address]  [phone]   Audiological Evaluation  Patient Name: Alison DraftVictoria I Lint   Status: Outpatient   DOB: 08/28/1931    Diagnosis: Hearing Loss                 MRN: [id number] Date:  05/23/2017     Referent: Wanda PlumpPaz, Jose E, MD  History: Alison DraftVictoria I Gottsch was seen for an audiological evaluation because of a "gradual hearing loss that began two years ago".  Accompanied by: Daughter - Carla. Primary Concern:Y - Yesterday for the first time had the feeling of an intermittent pulse on the left ear with pain. Pain: None today.  History of ear infections:  N History of ear surgery or "tubes" : N History of dizziness/vertigo:   Y - "for maybe a second".  History of balance issues:  Y - one a week she states that she "is walking and suddenly feels like is falling or moving to the right" and she needs to "grab something to keep her balance".  Tinnitus: N.  Sound sensitivity: Y - loud sounds bother her.  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 History of hypertension: Y - controlled with medication x 30+ years.. History of diabetes:Y - controlled with medication for about 1-2 years..  Family history of hearing loss:  N    Evaluation: Conventional pure tone audiometry from 250Hz  - 8000Hz  with using insert earphones.  Hearing Thresholds shows symmetrical hearing loss, slightly poorer in the left low frequencies, that is 35-40 dBHL at 250Hz ; 25-30 dBHL at 500Hz ; 20-30 dBHL at 1000Hz ; 40 dBHL at 2000Hz ; 50 dBHL at 4000hz  and 65-70 dBHL at 8000Hz  bilaterally. The hearing loss is sensorineural bilaterally. Reliability is good Speech reception levels (repeating words near threshold) using recorded spanish spondee word lists:  Right ear: 35 dBHL.  Left ear:  35/40 dBHL Word recognition (at comfortably loud volumes) using recorded spanish word lists at 75  dBHL, in quiet.  Right ear: 100%.  Left ear:   86% Word recognition in minimal background noise:  +5 dBHL  Right ear: 26%                              Left ear:  60%  Tympanometry shows normal middle ear volume, pressure and compliance (Type A) with symmetrical ipsilateral acoustic reflexes that range from 95-100dB on the right and 90-100dB on the left from 500Hz  - 4000Hz .   CONCLUSION:     Alison Graves states that yesterday, for the first time, she felt intermittent pulsing in the left ear with ear pain. This is not present today, however, it is unusual and may need further evaluation by an ENT.  [place] also has a mild low frequency hearing loss that slopes to a moderately severe high frequency sensorineural hearing loss bilaterally. This amount of hearing loss would adversely affect speech communication at normal conversational speech levels. Word recognition is excellent in the right ear and good in the left ear in quiet at very loud conversational speech levels bilaterally. In minimal background noise, word recognition drops to very poor in the right ear and is poor in the left ear.  A hearing aid evaluation is recommended since. Alison DraftVictoria I Prevo may benefit from amplification. Since [facility] dispenses hearing aids and works with a state assisted hearing aid  program, referral here is strongly recommended. In addition, since word recognition is not symmetrical, close monitoring with a repeat audiological evaluation is recommended in 6 months. However, this may also be completed at [facility], at the ENT office and/or in conjunction with hearing aid monitoring.   Finally, because of the intermittent, brief vertigo along with feeling that she is going to fall to one side, referral for a Balance Assessment is strongly recommended.   The test results were discussed and Alison DraftVictoria I Horsley counseled.   RECOMMENDATIONS: 1.   Referral for balance [facility] at the  [facility] on [address], [place]. Telephone [phone].  2.   Monitor hearing with a repeat audiological evaluation in 6 months. This may be completed at [facility] (Tel [phone] or FAX [phone]).    in pitch, frequency or loudness. In addition consider tinnitus masking and or amplification at the location of Thanya's choice.   3.    Consider referral to an ENT, since [place]ia states that she felt intermittent pulsing in the left ear with "pain" yesterday.   3.  Strategies that help improve hearing include: A) Face the speaker directly. Optimal is having the speakers face well - lit.  Unless amplified, being within 3-6 feet of the speaker will enhance word recognition. B) Avoid having the speaker back-lit as this will minimize the ability to use cues from lip-reading, facial expression and gestures. C)  Word recognition is poorer in background noise. For optimal word recognition, turn off the TV, radio or noisy fan when engaging in conversation. In a restaurant, try to sit away from noise sources and close to the primary speaker.        D)  Ask for topic clarification from time to time in order to remain in the conversation.  Most people don't mind repeating or clarifying a point when asked.  If needed, explain the difficulty hearing in background noise or hearing loss.   Khaliya Golinski L. Kate SableWoodward, Au.D., CCC-A Doctor of Audiology 05/23/2017 cc: Wanda PlumpPaz, Jose E, MD

## 2017-06-22 ENCOUNTER — Ambulatory Visit: Payer: Medicare Other | Admitting: Internal Medicine

## 2017-06-28 ENCOUNTER — Emergency Department (HOSPITAL_BASED_OUTPATIENT_CLINIC_OR_DEPARTMENT_OTHER)
Admission: EM | Admit: 2017-06-28 | Discharge: 2017-06-28 | Disposition: A | Discharge status: Home / Self Care | Payer: Medicare Other | Attending: Emergency Medicine | Admitting: Emergency Medicine

## 2017-06-28 ENCOUNTER — Encounter: Payer: Self-pay | Admitting: Internal Medicine

## 2017-06-28 ENCOUNTER — Other Ambulatory Visit: Payer: Self-pay

## 2017-06-28 ENCOUNTER — Ambulatory Visit (INDEPENDENT_AMBULATORY_CARE_PROVIDER_SITE_OTHER): Payer: Medicare Other | Admitting: Internal Medicine

## 2017-06-28 VITALS — BP 215/90 | HR 79 | Temp 98.0°F | Resp 14 | Ht <= 58 in | Wt 139.4 lb

## 2017-06-28 DIAGNOSIS — I10 Essential (primary) hypertension: Secondary | ICD-10-CM | POA: Insufficient documentation

## 2017-06-28 DIAGNOSIS — Z79899 Other long term (current) drug therapy: Secondary | ICD-10-CM | POA: Diagnosis not present

## 2017-06-28 DIAGNOSIS — I160 Hypertensive urgency: Secondary | ICD-10-CM | POA: Diagnosis not present

## 2017-06-28 DIAGNOSIS — E119 Type 2 diabetes mellitus without complications: Secondary | ICD-10-CM | POA: Diagnosis not present

## 2017-06-28 DIAGNOSIS — Z7982 Long term (current) use of aspirin: Secondary | ICD-10-CM | POA: Insufficient documentation

## 2017-06-28 LAB — BASIC METABOLIC PANEL
Anion gap: 7 (ref 5–15)
BUN: 22 mg/dL — ABNORMAL HIGH (ref 6–20)
CO2: 29 mmol/L (ref 22–32)
Calcium: 9.6 mg/dL (ref 8.9–10.3)
Chloride: 101 mmol/L (ref 101–111)
Creatinine, Ser: 0.8 mg/dL (ref 0.44–1.00)
GFR calc Af Amer: >60 mL/min (ref >60)
GFR calc non Af Amer: >60 mL/min (ref >60)
Glucose, Bld: 136 mg/dL — ABNORMAL HIGH (ref 65–99)
Potassium: 3.4 mmol/L — ABNORMAL LOW (ref 3.5–5.1)
Sodium: 137 mmol/L (ref 135–145)

## 2017-06-28 MED ORDER — POTASSIUM CHLORIDE CRYS ER 20 MEQ PO TBCR
20.0000 meq | EXTENDED_RELEASE_TABLET | Freq: Once | ORAL | Status: AC
  Administered 2017-06-28: 20 meq via ORAL
  Filled 2017-06-28: qty 1

## 2017-06-28 MED ORDER — CLONIDINE HCL 0.1 MG PO TABS: 0.1000 mg | ORAL_TABLET | Freq: Three times a day (TID) | ORAL | 0 refills | Status: DC

## 2017-06-28 MED ORDER — LABETALOL HCL 5 MG/ML IV SOLN
20.0000 mg | Freq: Once | INTRAVENOUS | Status: DC
  Filled 2017-06-28: qty 4

## 2017-06-28 MED FILL — cloNIDine HCL 0.1 MG TABS: 0.1 | 30 days supply | Qty: 90 | Fill #0

## 2017-06-28 NOTE — ED Provider Notes (Signed)
[facility] EMERGENCY DEPARTMENT Provider Note   CSN: [id number] Arrival date & time: 06/28/17  1113     History   Chief Complaint Chief Complaint  Patient presents with  . Hypertension    HPI Alison Graves is a 83 y.o. female.  HPI   83 year old female with hypertension.  History of the same.  There is little bit of a language barrier but family at bedside is interpreting.  They declined usage of interpreter service.  She is noted to be very hypertensive with systolic blood pressures greater than 200 during office visit earlier today.  She did not take her home medicines prior to coming.  She took them and on repeat blood pressure monitoring an hour later she still has GERD.  She denies any acute complaints otherwise.  Denies any pain.  No confusion per family.  No swelling.  She reports that she is compliant with her medicines otherwise.  Past Medical History:  Diagnosis Date  . Hemorrhoids   . HTN (hypertension)   . Hyperlipemia   . Microhematuria    s/p eval DR Patsi Sears 2004  . Osteoarthritis   . PMR (polymyalgia rheumatica) (HCC)    ?    Patient Active Problem List   Diagnosis Date Noted  . Diabetes mellitus, type II (HCC) 01/25/2017  . Chondrocalcinosis 04/24/2016  . Osteoarthritis of patellofemoral joints of both knees 04/24/2016  . Osteoarthritis of both hands 04/24/2016  . Osteoarthritis of feet, bilateral 04/24/2016  . PCP NOTES >>>>>>>>>>>>>>>>>>>>>>>>> 09/26/2015  . Edema 02/17/2015  . Anxiety 11/16/2011  . Annual physical exam 10/04/2010  . Osteopenia 10/04/2010  . MENOPAUSE-RELATED VASOMOTOR SYMPTOMS 04/19/2009  . Osteoarthritis of both knees 12/16/2008  . Polymyalgia rheumatica (HCC) 01/10/2008  . Dyslipidemia 11/29/2006  . HTN (hypertension) 11/20/2006  . HEMORRHOIDS 07/25/2006    Past Surgical History:  Procedure Laterality Date  . BREAST BIOPSY  2005   benign  . CATARACT EXTRACTION    . LAPAROSCOPIC HYSTERECTOMY     per Dr  Lily Peer, ?oophorectomy    OB History    No data available       Home Medications    Prior to Admission medications   Medication Sig Start Date End Date Taking? Authorizing Provider  acetaminophen (TYLENOL) 650 MG CR tablet Take 650 mg by mouth every 8 (eight) hours as needed. Reported on 01/18/2016    [provider]  aspirin 81 MG tablet Take 81 mg by mouth daily.      [provider]  atorvastatin (LIPITOR) 10 MG tablet Take 1 tablet (10 mg total) daily by mouth. 05/07/17   Wanda Plump, MD  Calcium Carbonate-Vitamin D (CALCIUM 600 + D PO) Take 3 tablets by mouth daily.     [provider]  carvedilol (COREG) 25 MG tablet Take 1 tablet (25 mg total) 2 (two) times daily with a meal by mouth. 05/07/17   Wanda Plump, MD  Cholecalciferol (VITAMIN D3) 5000 units TABS Take 4 tablets by mouth daily.    [provider]  cloNIDine (CATAPRES) 0.2 MG tablet Take 1 tablet (0.2 mg total) 3 (three) times daily by mouth. 05/07/17   Wanda Plump, MD  fish oil-omega-3 fatty acids 1000 MG capsule Take 4 g by mouth daily. Reported on 01/18/2016    [provider]  losartan-hydrochlorothiazide (HYZAAR) 100-25 MG tablet Take 1 tablet daily by mouth. 05/07/17   Wanda Plump, MD  metFORMIN (GLUCOPHAGE) 500 MG tablet Take 1 tablet (500 mg  total) 2 (two) times daily with a meal by mouth. 05/07/17   Wanda PlumpPaz, Jose E, MD  Multiple Vitamin (MULTIVITAMIN PO) Take 1 tablet by mouth daily.     [provider]    Family History Family History  Problem Relation Age of Onset  . Coronary artery disease Mother        early in life   . Diabetes Other        grandmother  . Stroke Neg Hx   . Hypertension Neg Hx   . Cancer Neg Hx     Social History Social History   Tobacco Use  . Smoking status: Never Smoker  . Smokeless tobacco: Never Used  Substance Use Topics  . Alcohol use: No    Alcohol/week: 0.0 oz  . Drug use: No     Allergies   Patient has no known  allergies.   Review of Systems Review of Systems  All systems reviewed and negative, other than as noted in HPI.  Physical Exam Updated Vital Signs BP (!) 203/81 (BP Location: Right Arm)   Pulse 74   Temp 98 F (36.7 C) (Oral)   Resp 18   SpO2 97%   Physical Exam  Constitutional: She appears well-developed and well-nourished. No distress.  HENT:  Head: Normocephalic and atraumatic.  Eyes: Conjunctivae are normal. Right eye exhibits no discharge. Left eye exhibits no discharge.  Neck: Neck supple.  Cardiovascular: Normal rate, regular rhythm and normal heart sounds. Exam reveals no gallop and no friction rub.  No murmur heard. Pulmonary/Chest: Effort normal and breath sounds normal. No respiratory distress.  Abdominal: Soft. She exhibits no distension. There is no tenderness.  Musculoskeletal: She exhibits no edema or tenderness.  Neurological: She is alert. No cranial nerve deficit. She exhibits normal muscle tone. Coordination normal.  Skin: Skin is warm and dry.  Psychiatric: She has a normal mood and affect. Her behavior is normal. Thought content normal.  Nursing note and vitals reviewed.    ED Treatments / Results  Labs (all labs ordered are listed, but only abnormal results are displayed) Labs Reviewed  BASIC METABOLIC PANEL - Abnormal; Notable for the following components:      Result Value   Potassium 3.4 (*)    Glucose, Bld 136 (*)    BUN 22 (*)    All other components within normal limits    EKG  EKG Interpretation None       Radiology No results found.  Procedures Procedures (including critical care time)  Medications Ordered in ED Medications  labetalol (NORMODYNE,TRANDATE) injection 20 mg (not administered)     Initial Impression / Assessment and Plan / ED Course  I have reviewed the triage vital signs and the nursing notes.  Pertinent labs & imaging results that were available during my care of the patient were reviewed by me and  considered in my medical decision making (see chart for details).     83 year old female with asymptomatic hypertension.  Renal function appears to be close to her baseline.  She has no acute complaints.  Her blood pressure improved by the time she was seen in the emergency room.  She skipped her medications this morning and I think there finally starting to catch up with her.  Will temporarily increase her clonidine to 0.3 mg from 0.2 mg TID until she could follow back up with Dr. Drue NovelPaz.   Final Clinical Impressions(s) / ED Diagnoses   Final diagnoses:  Hypertension, unspecified type  ED Discharge Orders    None       Raeford RazorKohut, Shanda Cadotte, MD 07/02/17 1537

## 2017-06-28 NOTE — ED Notes (Signed)
ED Provider at bedside. 

## 2017-06-28 NOTE — Progress Notes (Signed)
Subjective:    Patient ID: Alison Graves, female    DOB: 08/21/1931, 83 y.o.   MRN: [id number]  DOS:  06/28/2017 Type of visit - description : rov Interval history: Here with her 2 daughters, when she arrive my nurse check her blood pressure and it was 211/78. I recheck it in both arms: 215/90. She reports good compliance with medications but has not taking any of her medications today.   BP Readings from Last 3 Encounters:  06/29/17 (!) 185/64  06/28/17 (!) 175/65  06/28/17 (!) 215/90      Review of Systems She denies chest pain, occasional peri-ankle edema which is at baseline.  No difficulty breathing No visual disturbances, vision at baseline She has mild headache on and off, nothing new to her. Denies dizziness, diplopia, slurred speech or motor deficits  Past Medical History:  Diagnosis Date  . Hemorrhoids   . HTN (hypertension)   . Hyperlipemia   . Microhematuria    s/p eval DR Patsi Searsannenbaum 2004  . Osteoarthritis   . PMR (polymyalgia rheumatica) (HCC)    ?    Past Surgical History:  Procedure Laterality Date  . BREAST BIOPSY  2005   benign  . CATARACT EXTRACTION    . LAPAROSCOPIC HYSTERECTOMY     per Dr Lily PeerFernandez, ?oophorectomy    Social History   Socioeconomic History  . Marital status: Widowed    Spouse name: Not on file  . Number of children: 3  . Years of education: Not on file  . Highest education level: Not on file  Social Needs  . Financial resource strain: Not on file  . Food insecurity - worry: Not on file  . Food insecurity - inability: Not on file  . Transportation needs - medical: Not on file  . Transportation needs - non-medical: Not on file  Occupational History  . Occupation: retired   Tobacco Use  . Smoking status: Never Smoker  . Smokeless tobacco: Never Used  Substance and Sexual Activity  . Alcohol use: No    Alcohol/week: 0.0 oz  . Drug use: No  . Sexual activity: No    Birth control/protection: Surgical  Other  Topics Concern  . Not on file  Social History Narrative   Widow,  Original from [place] , lives w/ 2 of her 3 daughters      Allergies as of 06/28/2017   No Known Allergies     Medication List        Accurate as of 06/28/17 11:59 PM. Always use your most recent med list.          acetaminophen 650 MG CR tablet Commonly known as:  TYLENOL Take 650 mg by mouth every 8 (eight) hours as needed. Reported on 01/18/2016   aspirin 81 MG tablet Take 81 mg by mouth daily.   atorvastatin 10 MG tablet Commonly known as:  LIPITOR Take 1 tablet (10 mg total) daily by mouth.   CALCIUM 600 + D PO Take 3 tablets by mouth daily.   carvedilol 25 MG tablet Commonly known as:  COREG Take 1 tablet (25 mg total) 2 (two) times daily with a meal by mouth.   cloNIDine 0.2 MG tablet Commonly known as:  CATAPRES Take 1 tablet (0.2 mg total) 3 (three) times daily by mouth.   cloNIDine 0.1 MG tablet Commonly known as:  CATAPRES Take 1 tablet (0.1 mg total) by mouth 3 (three) times daily. Take an additional 0.1mg  of three times per day for  a total of 0.3mg  TID.   fish oil-omega-3 fatty acids 1000 MG capsule Take 4 g by mouth daily. Reported on 01/18/2016   losartan-hydrochlorothiazide 100-25 MG tablet Commonly known as:  HYZAAR Take 1 tablet daily by mouth.   metFORMIN 500 MG tablet Commonly known as:  GLUCOPHAGE Take 1 tablet (500 mg total) 2 (two) times daily with a meal by mouth.   MULTIVITAMIN PO Take 1 tablet by mouth daily.   Vitamin D3 5000 units Tabs Take 4 tablets by mouth daily.          Objective:   Physical Exam BP (!) 215/90 (BP Location: Right Arm, Patient Position: Sitting, Cuff Size: Normal)   Pulse 79   Temp 98 F (36.7 C) (Oral)   Resp 14   Ht 4\' 10"  (1.473 m)   Wt 139 lb 6 oz (63.2 kg)   SpO2 96%   BMI 29.13 kg/m  General:   Well developed, well nourished . NAD.  HEENT:  Normocephalic . Face symmetric, atraumatic Undilated funduscopy: Limited but  I did not see any hemorrhages Lungs:  CTA B Normal respiratory effort, no intercostal retractions, no accessory muscle use. Heart: RRR,  no murmur.  no pretibial edema bilaterally  Abdomen:  Not distended, soft, non-tender. No rebound or rigidity.  No bruit Skin: Not pale. Not jaundice Neurologic:  alert & oriented X3.  Speech normal, gait appropriate for age   Psych--  Cognition and judgment appear intact.  Cooperative with normal attention span and concentration.  Behavior appropriate. No anxious or depressed appearing.     Assessment & Plan:    Assessment DM HTN Hyperlipidemia LE edema  Microhematuria, urology eval 2004 Osteopenia-- t score -1.1 2011, t score normal 10-2015 DJD - knees H/o PMR  Years ago, saw rheumatology (Dr Coral SpikesLevitin) , dx PMR with normal sed rate- CRP but elevated CK. Responded to steroids  PLAN: Hypertensive urgency: h/o chronic hypertension and good compliance with medication presents with elevated BP, she is asx, BP checked by me in both arms:  215/90.  She has not taking her medications today so I asked her to take them here at the office (carvedilol, clonidine and losartan HCT) 10:10 AM. 10:40 AM: BP by my nurse 206/75 10:58 AM: BP by me  205/90.  BP continue to be elevated, discussed the case with the ER physician, will send her down there with a diagnosis of hypertensive urgency, hopefully they can lower her BP with additional medication, if labs show a elevated creatinine etc , she will have a  hypertensive emergency  I will be able to follow the patient tomorrow if needed.

## 2017-06-28 NOTE — Patient Instructions (Signed)
Please go to the ER for further evaluation, I ready talk with the physician over there.

## 2017-06-28 NOTE — ED Triage Notes (Addendum)
Pt was sent from PCP for elevated BP-pt does have hx of HTN and states she is compliant with meds-NAD-steady gait

## 2017-06-28 NOTE — Progress Notes (Signed)
Pre visit review using our clinic review tool, if applicable. No additional management support is needed unless otherwise documented below in the visit note. 

## 2017-06-29 ENCOUNTER — Ambulatory Visit (INDEPENDENT_AMBULATORY_CARE_PROVIDER_SITE_OTHER): Payer: Medicare Other | Admitting: Internal Medicine

## 2017-06-29 VITALS — BP 185/64 | HR 77 | Temp 98.1°F | Ht <= 58 in | Wt 139.6 lb

## 2017-06-29 DIAGNOSIS — I160 Hypertensive urgency: Secondary | ICD-10-CM | POA: Diagnosis not present

## 2017-06-29 DIAGNOSIS — Z23 Encounter for immunization: Secondary | ICD-10-CM | POA: Diagnosis not present

## 2017-06-29 MED ORDER — CLONIDINE HCL 0.3 MG PO TABS: 0.3000 mg | ORAL_TABLET | Freq: Three times a day (TID) | ORAL | 3 refills | Status: DC

## 2017-06-29 MED ORDER — AMLODIPINE BESYLATE 5 MG PO TABS: 5.0000 mg | ORAL_TABLET | Freq: Every day | ORAL | 3 refills | Status: DC

## 2017-06-29 NOTE — Assessment & Plan Note (Signed)
Hypertensive urgency: h/o chronic hypertension and good compliance with medication presents with elevated BP, she is asx, BP checked by me in both arms:  215/90.  She has not taking her medications today so I asked her to take them here at the office (carvedilol, clonidine and losartan HCT) 10:10 AM. 10:40 AM: BP by my nurse 206/75 10:58 AM: BP by me  205/90.  BP continue to be elevated, discussed the case with the ER physician, will send her down there with a diagnosis of hypertensive urgency, hopefully they can lower her BP with additional medication, if labs show a elevated creatinine etc , she will have a  hypertensive emergency  I will be able to follow the patient tomorrow if needed.

## 2017-06-29 NOTE — Patient Instructions (Signed)
GO TO THE LAB : Get the blood work     GO TO THE FRONT DESK Schedule your next appointment for a checkup in 4 weeks  Please read the medication list carefully. We are adding amlodipine 5 mg 1 tablet every day, watch for leg swelling Clonidine we are going to take 0.3 mg 3 times a day   Check the  blood pressure   daily Be sure your blood pressure is between 110/65 and  135/85. If it is consistently higher or lower, let me know  ER if severe headache, chest pain, BP more than 200   persistently.

## 2017-06-29 NOTE — Progress Notes (Signed)
Subjective:    Patient ID: Alison Graves, female    DOB: 03/10/1932, 83 y.o.   MRN: [id number]  DOS:  06/29/2017 Type of visit - description : ED f/u Interval history: Was seen here at the office yesterday with a hypertensive urgency.  Was sent down to the ER for further treatment.  By the time she arrived to the ER, BP was improving, BMP look good except for a potassium of 3.4. They temporarily increase clonidine to 0.3 mg. BPs at the emergency room:  203/81 169/96 175/65.  Review of Systems  Since yesterday she continued to feel well. No chest pain or difficulty breathing No lower extremity edema She took her medications this morning, only about 30 minutes ago including clonidine 0.3 mg.  Past Medical History:  Diagnosis Date  . Hemorrhoids   . HTN (hypertension)   . Hyperlipemia   . Microhematuria    s/p eval DR Patsi Searsannenbaum 2004  . Osteoarthritis   . PMR (polymyalgia rheumatica) (HCC)    ?    Past Surgical History:  Procedure Laterality Date  . BREAST BIOPSY  2005   benign  . CATARACT EXTRACTION    . LAPAROSCOPIC HYSTERECTOMY     per Dr Lily PeerFernandez, ?oophorectomy    Social History   Socioeconomic History  . Marital status: Widowed    Spouse name: Not on file  . Number of children: 3  . Years of education: Not on file  . Highest education level: Not on file  Social Needs  . Financial resource strain: Not on file  . Food insecurity - worry: Not on file  . Food insecurity - inability: Not on file  . Transportation needs - medical: Not on file  . Transportation needs - non-medical: Not on file  Occupational History  . Occupation: retired   Tobacco Use  . Smoking status: Never Smoker  . Smokeless tobacco: Never Used  Substance and Sexual Activity  . Alcohol use: No    Alcohol/week: 0.0 oz  . Drug use: No  . Sexual activity: No    Birth control/protection: Surgical  Other Topics Concern  . Not on file  Social History Narrative   Widow,  Original  from [place] , lives w/ 2 of her 3 daughters      Allergies as of 06/29/2017   No Known Allergies     Medication List        Accurate as of 06/29/17 10:43 AM. Always use your most recent med list.          acetaminophen 650 MG CR tablet Commonly known as:  TYLENOL Take 650 mg by mouth every 8 (eight) hours as needed. Reported on 01/18/2016   aspirin 81 MG tablet Take 81 mg by mouth daily.   atorvastatin 10 MG tablet Commonly known as:  LIPITOR Take 1 tablet (10 mg total) daily by mouth.   CALCIUM 600 + D PO Take 3 tablets by mouth daily.   carvedilol 25 MG tablet Commonly known as:  COREG Take 1 tablet (25 mg total) 2 (two) times daily with a meal by mouth.   cloNIDine 0.2 MG tablet Commonly known as:  CATAPRES Take 1 tablet (0.2 mg total) 3 (three) times daily by mouth.   cloNIDine 0.1 MG tablet Commonly known as:  CATAPRES Take 1 tablet (0.1 mg total) by mouth 3 (three) times daily. Take an additional 0.1mg  of three times per day for a total of 0.3mg  TID.   fish oil-omega-3 fatty acids 1000  MG capsule Take 4 g by mouth daily. Reported on 01/18/2016   losartan-hydrochlorothiazide 100-25 MG tablet Commonly known as:  HYZAAR Take 1 tablet daily by mouth.   metFORMIN 500 MG tablet Commonly known as:  GLUCOPHAGE Take 1 tablet (500 mg total) 2 (two) times daily with a meal by mouth.   MULTIVITAMIN PO Take 1 tablet by mouth daily.   Vitamin D3 5000 units Tabs Take 4 tablets by mouth daily.          Objective:   Physical Exam BP (!) 185/64   Pulse 77   Temp 98.1 F (36.7 C) (Oral)   Ht 4\' 10"  (1.473 m)   Wt 139 lb 9.6 oz (63.3 kg)   SpO2 97%   BMI 29.18 kg/m  General:   Well developed, well nourished . NAD.  HEENT:  Normocephalic . Face symmetric, atraumatic Lungs:  CTA B Normal respiratory effort, no intercostal retractions, no accessory muscle use. Heart: RRR,  no murmur.  no pretibial edema bilaterally  Abdomen:  Not distended, soft,  non-tender. No rebound or rigidity.  No bruit or mass Skin: Not pale. Not jaundice Neurologic:  alert & oriented X3.  Speech normal, gait appropriate for age   Psych--  Cognition and judgment appear intact.  Cooperative with normal attention span and concentration.  Behavior appropriate. No anxious or depressed appearing.     Assessment & Plan:   Assessment DM HTN Hyperlipidemia LE edema  Microhematuria, urology eval 2004 Osteopenia-- t score -1.1 2011, t score normal 10-2015 DJD - knees H/o PMR  Years ago, saw rheumatology (Dr Coral SpikesLevitin) , dx PMR with normal sed rate- CRP but elevated CK. Responded to steroids  PLAN: Hypertensive urgency: Went to the ER yesterday, records reviewed.   BMP satisfactory except for slightly low potassium. I agree with the ER, will permanently increase clonidine to 0.3 mg daily 3 times daily. Continue carvedilol 25 mg twice a day, Hyzaar 100/25 daily. Add amlodipine 5 mg, watch for swelling. Low-salt diet. Check ambulatory BPs and let me know results, see AVS.  All instructions carefully discussed with her daughters Preventive care: Flu shot today RTC 4 weeks.

## 2017-07-01 NOTE — Assessment & Plan Note (Addendum)
Hypertensive urgency: Went to the ER yesterday, records reviewed.   BMP satisfactory except for slightly low potassium. I agree with the ER, will permanently increase clonidine to 0.3 mg daily 3 times daily. Continue carvedilol 25 mg twice a day, Hyzaar 100/25 daily. Add amlodipine 5 mg, watch for swelling. Low-salt diet. Check ambulatory BPs and let me know results, see AVS.  All instructions carefully discussed with her daughters Preventive care: Flu shot today RTC 4 weeks.

## 2017-07-04 ENCOUNTER — Other Ambulatory Visit: Payer: Self-pay | Admitting: Internal Medicine

## 2017-07-10 ENCOUNTER — Telehealth: Payer: Self-pay | Admitting: Internal Medicine

## 2017-07-10 NOTE — Telephone Encounter (Signed)
Please call  one of the patient's daughters, she was seen with a hypertensive urgency, how she is doing, BP better?

## 2017-07-11 NOTE — Telephone Encounter (Signed)
LMOM informing Pt's daughter's Albin FellingCarla or Windell MouldingRuth Marcelino Duster(Michelle) to call and let us know how her BPs are doing. Okay for [facility] to discuss w/ them once they return call.

## 2017-07-19 ENCOUNTER — Encounter: Payer: Self-pay | Admitting: Internal Medicine

## 2017-07-30 ENCOUNTER — Encounter: Payer: Self-pay | Admitting: Internal Medicine

## 2017-07-30 ENCOUNTER — Ambulatory Visit: Payer: Medicare Other | Admitting: Internal Medicine

## 2017-07-30 ENCOUNTER — Ambulatory Visit (INDEPENDENT_AMBULATORY_CARE_PROVIDER_SITE_OTHER): Payer: Medicare Other | Admitting: Internal Medicine

## 2017-07-30 VITALS — BP 138/84 | HR 71 | Temp 98.1°F | Resp 14 | Ht <= 58 in | Wt 135.0 lb

## 2017-07-30 DIAGNOSIS — I10 Essential (primary) hypertension: Secondary | ICD-10-CM | POA: Diagnosis not present

## 2017-07-30 DIAGNOSIS — E118 Type 2 diabetes mellitus with unspecified complications: Secondary | ICD-10-CM | POA: Diagnosis not present

## 2017-07-30 MED ORDER — BLOOD PRESSURE MONITOR AUTOMAT DEVI: 0 refills | Status: AC

## 2017-07-30 NOTE — Patient Instructions (Addendum)
GO TO THE LAB : Get the blood work     GO TO THE FRONT DESK Schedule your next appointment for a check up in 4-5 months      Check the  blood pressure 2 or 3 times a   week   Be sure your blood pressure is between 110/65 and  135/85. If it is consistently higher or lower, let me know

## 2017-07-30 NOTE — Progress Notes (Signed)
bPre visit review using our clinic review tool, if applicable. No additional management support is needed unless otherwise documented below in the visit note.

## 2017-07-30 NOTE — Progress Notes (Signed)
Subjective:    Patient ID: Alison Graves, female    DOB: 04/02/1932, 84 y.o.   MRN: [id number]  DOS:  07/30/2017 Type of visit - description : f/u Interval history: Here with one of her daughters. HTN: Good compliance with medication, no apparent side effects, ambulatory BPs very good. DM: On metformin, due for A1c   Review of Systems Denies chest pain, difficulty breathing. No lower extremity edema or dizziness No headaches  Past Medical History:  Diagnosis Date  . Hemorrhoids   . HTN (hypertension)   . Hyperlipemia   . Microhematuria    s/p eval DR Patsi Searsannenbaum 2004  . Osteoarthritis   . PMR (polymyalgia rheumatica) (HCC)    ?    Past Surgical History:  Procedure Laterality Date  . BREAST BIOPSY  2005   benign  . CATARACT EXTRACTION    . LAPAROSCOPIC HYSTERECTOMY     per Dr Lily PeerFernandez, ?oophorectomy    Social History   Socioeconomic History  . Marital status: Widowed    Spouse name: Not on file  . Number of children: 3  . Years of education: Not on file  . Highest education level: Not on file  Social Needs  . Financial resource strain: Not on file  . Food insecurity - worry: Not on file  . Food insecurity - inability: Not on file  . Transportation needs - medical: Not on file  . Transportation needs - non-medical: Not on file  Occupational History  . Occupation: retired   Tobacco Use  . Smoking status: Never Smoker  . Smokeless tobacco: Never Used  Substance and Sexual Activity  . Alcohol use: No    Alcohol/week: 0.0 oz  . Drug use: No  . Sexual activity: No    Birth control/protection: Surgical  Other Topics Concern  . Not on file  Social History Narrative   Widow,  Original from [place] , lives w/ 2 of her 3 daughters      Allergies as of 07/30/2017   No Known Allergies     Medication List        Accurate as of 07/30/17 11:59 PM. Always use your most recent med list.          acetaminophen 650 MG CR tablet Commonly known as:   TYLENOL Take 650 mg by mouth every 8 (eight) hours as needed. Reported on 01/18/2016   amLODipine 5 MG tablet Commonly known as:  NORVASC Take 1 tablet (5 mg total) by mouth daily.   aspirin 81 MG tablet Take 81 mg by mouth daily.   atorvastatin 10 MG tablet Commonly known as:  LIPITOR Take 1 tablet (10 mg total) by mouth daily.   Blood Pressure Monitor Automat Devi Check blood pressure daily as directed   CALCIUM 600 + D PO Take 3 tablets by mouth daily.   carvedilol 25 MG tablet Commonly known as:  COREG Take 1 tablet (25 mg total) by mouth 2 (two) times daily with a meal.   cloNIDine 0.3 MG tablet Commonly known as:  CATAPRES Take 1 tablet (0.3 mg total) by mouth 3 (three) times daily.   fish oil-omega-3 fatty acids 1000 MG capsule Take 4 g by mouth daily. Reported on 01/18/2016   losartan-hydrochlorothiazide 100-25 MG tablet Commonly known as:  HYZAAR Take 1 tablet by mouth daily.   metFORMIN 500 MG tablet Commonly known as:  GLUCOPHAGE Take 1 tablet (500 mg total) by mouth 2 (two) times daily with a meal.   MULTIVITAMIN PO  Take 1 tablet by mouth daily.   Vitamin D3 5000 units Tabs Take 4 tablets by mouth daily.          Objective:   Physical Exam BP 138/84 (BP Location: Left Arm, Patient Position: Sitting, Cuff Size: Small)   Pulse 71   Temp 98.1 F (36.7 C) (Oral)   Resp 14   Ht 4\' 10"  (1.473 m)   Wt 135 lb (61.2 kg)   SpO2 97%   BMI 28.22 kg/m  General:   Well developed, elderly lady in no distress. HEENT:  Normocephalic . Face symmetric, atraumatic Lungs:  CTA B Normal respiratory effort, no intercostal retractions, no accessory muscle use. Heart: RRR,  no murmur.  No pretibial edema bilaterally  Skin: Not pale. Not jaundice Neurologic:  alert & oriented X3.  Speech normal, gait assisted by a cane, appropriate for age Psych--  Cognition and judgment appear intact.  Cooperative with normal attention span and concentration.  Behavior  appropriate. No anxious or depressed appearing.      Assessment & Plan:   Assessment DM HTN Hyperlipidemia LE edema  Microhematuria, urology eval 2004 Osteopenia-- t score -1.1 2011, t score normal 10-2015 DJD - knees H/o PMR  Years ago, saw rheumatology (Dr Coral Spikes) , dx PMR with normal sed rate- CRP but elevated CK. Responded to steroids  PLAN: HTN: Currently well controlled, ambulatory BPs from 120-110.  Check a BMP, continue with amlodipine, clonidine, Hyzaar.  Prescription for a BP monitor provided. DM: On metformin, check a A1c. RTC 4-5 months

## 2017-07-30 NOTE — Telephone Encounter (Signed)
Pt seen by Dr. Drue NovelPaz today at [facility]4PM.

## 2017-07-31 ENCOUNTER — Other Ambulatory Visit: Payer: Medicare Other

## 2017-07-31 DIAGNOSIS — D729 Disorder of white blood cells, unspecified: Secondary | ICD-10-CM | POA: Diagnosis not present

## 2017-07-31 DIAGNOSIS — E118 Type 2 diabetes mellitus with unspecified complications: Secondary | ICD-10-CM | POA: Diagnosis not present

## 2017-07-31 LAB — BASIC METABOLIC PANEL
BUN: 36 mg/dL — AB (ref 6–23)
CHLORIDE: 96 meq/L (ref 96–112)
CO2: 33 mEq/L — ABNORMAL HIGH (ref 19–32)
Calcium: 10.3 mg/dL (ref 8.4–10.5)
Creatinine, Ser: 1.14 mg/dL (ref 0.40–1.20)
GFR: 48.29 mL/min — ABNORMAL LOW (ref >60.00)
GLUCOSE: 147 mg/dL — AB (ref 70–99)
POTASSIUM: 3.7 meq/L (ref 3.5–5.1)
Sodium: 137 mEq/L (ref 135–145)

## 2017-08-01 LAB — HEMOGLOBIN A1C
EAG (MMOL/L): 9.7 (calc)
Hgb A1c MFr Bld: 7.7 % of total Hgb — ABNORMAL HIGH (ref <5.7)
Mean Plasma Glucose: 174 (calc)

## 2017-08-01 NOTE — Assessment & Plan Note (Signed)
HTN: Currently well controlled, ambulatory BPs from 120-110.  Check a BMP, continue with amlodipine, clonidine, Hyzaar.  Prescription for a BP monitor provided. DM: On metformin, check a A1c. RTC 4-5 months

## 2017-08-02 MED ORDER — METFORMIN HCL 500 MG PO TABS: ORAL_TABLET | ORAL | 5 refills | Status: DC

## 2017-08-02 NOTE — Addendum Note (Signed)
Addended byConrad Sharon: Zani Kyllonen D on: 08/02/2017 10:38 AM   Modules accepted: Orders

## 2017-08-08 ENCOUNTER — Telehealth: Payer: Self-pay

## 2017-08-08 MED ORDER — LOSARTAN POTASSIUM 100 MG PO TABS: 100.0000 mg | ORAL_TABLET | Freq: Every day | ORAL | 5 refills | Status: DC

## 2017-08-08 MED ORDER — HYDROCHLOROTHIAZIDE 25 MG PO TABS: 25.0000 mg | ORAL_TABLET | Freq: Every day | ORAL | 5 refills | Status: DC

## 2017-08-08 NOTE — Telephone Encounter (Signed)
Okay to call: Losartan 100 mg 1 p.o. daily Hydrochlorothiazide 25 mg 1 p.o. daily Let the family know that we will have to be 2 separate pills.

## 2017-08-08 NOTE — Telephone Encounter (Signed)
Received fax from overnight on call triage- Windell MouldingRuth, Pt's daughter called office to let us know that Pt is out of BP med (losartan-hctz) due to recall. I have called [facility] and verified that they are unable to get in losartan-hctz 100-25mg  tablets due to recall- Pt's daughter Windell MouldingRuth requesting another medication be prescribed in place. Please advise.

## 2017-08-08 NOTE — Telephone Encounter (Signed)
HCTZ and losartan sent to [facility]. LMOM informing Windell MouldingRuth of med changes. Instructed her to call if questions/concerns.

## 2017-08-24 ENCOUNTER — Other Ambulatory Visit: Payer: Self-pay

## 2017-08-24 MED ORDER — HYDROCHLOROTHIAZIDE 25 MG PO TABS: 25.0000 mg | ORAL_TABLET | Freq: Every day | ORAL | 1 refills | Status: DC

## 2017-08-24 MED ORDER — LOSARTAN POTASSIUM 100 MG PO TABS: 100.0000 mg | ORAL_TABLET | Freq: Every day | ORAL | 1 refills | Status: DC

## 2017-10-29 ENCOUNTER — Other Ambulatory Visit: Payer: Self-pay | Admitting: Internal Medicine

## 2017-11-09 ENCOUNTER — Telehealth: Payer: Self-pay | Admitting: *Deleted

## 2017-11-09 NOTE — Telephone Encounter (Signed)
Received Medical/Surgical Clearance Form from [facility]; forwarded to provider with last labs & OV notes/SLS 05/10

## 2017-11-12 NOTE — Telephone Encounter (Signed)
Form signed and faxed to [facility] at [phone]. Form sent for scanning.

## 2017-12-18 ENCOUNTER — Ambulatory Visit: Payer: Medicare Other | Admitting: Internal Medicine

## 2017-12-24 ENCOUNTER — Encounter: Payer: Self-pay | Admitting: Internal Medicine

## 2017-12-24 ENCOUNTER — Ambulatory Visit (INDEPENDENT_AMBULATORY_CARE_PROVIDER_SITE_OTHER): Payer: Medicare Other | Admitting: Internal Medicine

## 2017-12-24 VITALS — BP 134/72 | HR 75 | Temp 98.1°F | Resp 16 | Ht <= 58 in | Wt 139.1 lb

## 2017-12-24 DIAGNOSIS — R252 Cramp and spasm: Secondary | ICD-10-CM

## 2017-12-24 DIAGNOSIS — I10 Essential (primary) hypertension: Secondary | ICD-10-CM | POA: Diagnosis not present

## 2017-12-24 DIAGNOSIS — R609 Edema, unspecified: Secondary | ICD-10-CM | POA: Diagnosis not present

## 2017-12-24 DIAGNOSIS — E785 Hyperlipidemia, unspecified: Secondary | ICD-10-CM | POA: Diagnosis not present

## 2017-12-24 DIAGNOSIS — E118 Type 2 diabetes mellitus with unspecified complications: Secondary | ICD-10-CM | POA: Diagnosis not present

## 2017-12-24 MED ORDER — GLUCOSE BLOOD VI STRP: ORAL_STRIP | 12 refills | Status: AC

## 2017-12-24 MED ORDER — ONETOUCH VERIO FLEX SYSTEM W/DEVICE KIT: PACK | 0 refills | Status: AC

## 2017-12-24 MED ORDER — ONETOUCH LANCETS MISC: 12 refills | Status: AC

## 2017-12-24 NOTE — Progress Notes (Signed)
Pre visit review using our clinic review tool, if applicable. No additional management support is needed unless otherwise documented below in the visit note. 

## 2017-12-24 NOTE — Patient Instructions (Addendum)
GO TO THE LAB : Get the blood work    GO TO THE FRONT DESK Schedule your next appointment for a checkup in 2 months  For swelling: Stop amlodipine Elevate your legs 1/2  hour twice a day Watch your salt intake  Check the  blood pressure 2 or 3 times a   week   Be sure your blood pressure is between 110/65 and  135/85. If it is consistently higher or lower, let me know  Diabetes: Check your blood sugar  once a day   at different times of the day GOALS: Fasting before a meal 70- 130 2 hours after a meal less than 180

## 2017-12-24 NOTE — Progress Notes (Signed)
Subjective:    Patient ID: Alison Graves, female    DOB: 1932-06-02, 84 y.o.   MRN: [id number]  DOS:  12/24/2017 Type of visit - description : rov Interval history:  DM: good med compliance, no amb CBGs HTN: good med compliance, amb BPs 114/60 to 141/77 C/o edema feet and ankles bilaterally, not worse at the end of the day, noted from early in the morning.  Noted more swelling for the last 6 weeks. Denies any salt intake indiscretions.   Wt Readings from Last 3 Encounters:  12/24/17 139 lb 2 oz (63.1 kg)  07/30/17 135 lb (61.2 kg)  06/29/17 139 lb 9.6 oz (63.3 kg)    Review of Systems Denies chest pain no difficulty breathing.  No orthopnea.  Admits to some weight gain. No cough No nausea, vomiting, diarrhea Reports cramps mostly at the legs, sometimes on the hands.  She takes vinegar with some help.   Past Medical History:  Diagnosis Date  . Hemorrhoids   . HTN (hypertension)   . Hyperlipemia   . Microhematuria    s/p eval DR Tannenbaum 2004  . Osteoarthritis   . PMR (polymyalgia rheumatica) (HCC)    ?    Past Surgical History:  Procedure Laterality Date  . BREAST BIOPSY  2005   benign  . CATARACT EXTRACTION    . LAPAROSCOPIC HYSTERECTOMY     per Dr Toney Rakes, ?oophorectomy    Social History   Socioeconomic History  . Marital status: Widowed    Spouse name: Not on file  . Number of children: 3  . Years of education: Not on file  . Highest education level: Not on file  Occupational History  . Occupation: retired   Scientific laboratory technician  . Financial resource strain: Not on file  . Food insecurity:    Worry: Not on file    Inability: Not on file  . Transportation needs:    Medical: Not on file    Non-medical: Not on file  Tobacco Use  . Smoking status: Never Smoker  . Smokeless tobacco: Never Used  Substance and Sexual Activity  . Alcohol use: No  . Drug use: No  . Sexual activity: Not Currently    Birth control/protection: Surgical  Lifestyle  .  Physical activity:    Days per week: Not on file    Minutes per session: Not on file  . Stress: Not on file  Relationships  . Social connections:    Talks on phone: Not on file    Gets together: Not on file    Attends religious service: Not on file    Active member of club or organization: Not on file    Attends meetings of clubs or organizations: Not on file    Relationship status: Not on file  . Intimate partner violence:    Fear of current or ex partner: Not on file    Emotionally abused: Not on file    Physically abused: Not on file    Forced sexual activity: Not on file  Other Topics Concern  . Not on file  Social History Narrative   Widow,  Original from [place] , lives w/ 2 of her 3 daughters      Allergies as of 12/24/2017   No Known Allergies     Medication List        Accurate as of 12/24/17 11:59 PM. Always use your most recent med list.          acetaminophen 650  MG CR tablet Commonly known as:  TYLENOL Take 650 mg by mouth every 8 (eight) hours as needed. Reported on 01/18/2016   aspirin 81 MG tablet Take 81 mg by mouth daily.   atorvastatin 10 MG tablet Commonly known as:  LIPITOR Take 1 tablet (10 mg total) by mouth daily.   Blood Pressure Monitor Automat Devi Check blood pressure daily as directed   CALCIUM 600 + D PO Take 3 tablets by mouth daily.   carvedilol 25 MG tablet Commonly known as:  COREG Take 1 tablet (25 mg total) by mouth 2 (two) times daily with a meal.   cloNIDine 0.3 MG tablet Commonly known as:  CATAPRES Take 1 tablet (0.3 mg total) by mouth 3 (three) times daily.   fish oil-omega-3 fatty acids 1000 MG capsule Take 4 g by mouth daily. Reported on 01/18/2016   glucose blood test strip Commonly known as:  ONETOUCH VERIO Check blood sugar once daily   hydrochlorothiazide 25 MG tablet Commonly known as:  HYDRODIURIL Take 1 tablet (25 mg total) by mouth daily.   losartan 100 MG tablet Commonly known as:  COZAAR Take  1 tablet (100 mg total) by mouth daily.   metFORMIN 500 MG tablet Commonly known as:  GLUCOPHAGE Take 1.5 tablets by mouth twice daily   MULTIVITAMIN PO Take 1 tablet by mouth daily.   ONE TOUCH LANCETS Misc Check blood sugar once daily   ONETOUCH VERIO FLEX SYSTEM w/Device Kit Check blood sugar once daily   Vitamin D3 5000 units Tabs Take 4 tablets by mouth daily.          Objective:   Physical Exam BP 134/72 (BP Location: Left Arm, Patient Position: Sitting, Cuff Size: Small)   Pulse 75   Temp 98.1 F (36.7 C) (Oral)   Resp 16   Ht 4' 10"  (1.473 m)   Wt 139 lb 2 oz (63.1 kg)   SpO2 96%   BMI 29.08 kg/m  General:   Well developed, NAD, see BMI.  HEENT:  Normocephalic . Face symmetric, atraumatic Lungs:  CTA B Normal respiratory effort, no intercostal retractions, no accessory muscle use. Heart: RRR,  no murmur.  +/+++ pretibial and peri-ankle edema bilaterally, symmetric. DIABETIC FEET EXAM: Normal pedal pulses bilaterally Skin normal, nails normal, no calluses Pinprick examination of the feet normal. Skin: Not pale. Not jaundice Neurologic:  alert & oriented X3.  Speech normal, gait assisted by cane, very limited by DJD Psych--  Cognition and judgment appear intact.  Cooperative with normal attention span and concentration.  Behavior appropriate. No anxious or depressed appearing.      Assessment & Plan:    Assessment DM HTN Hyperlipidemia LE edema  Microhematuria, urology eval 2004 Osteopenia-- t score -1.1 2011, t score normal 10-2015 DJD - knees H/o PMR  Years ago, saw rheumatology (Dr Rockwell Alexandria) , dx PMR with normal sed rate- CRP but elevated CK. Responded to steroids  PLAN: DM: Last A1c 7.7, metformin increased to 500 mg 1.5 tablets twice a day, has a hard time breaking the pill, which results will consider change to metformin 850 mg B.I.D..  No ambulatory CBGs, request a glucometer.  Foot exam negative.  Check a A1c HTN: Very good  ambulatory BPs, currently on amlodipine, carvedilol, clonidine, HCTZ, losartan.  Has developed symmetric lower extremity edema, no CHF type of symptoms, will stop amlodipine, low-salt diet, leg elevation, monitor BPs.  Consider increase clonidine. Check a BMP and a magnesian due to leg cramps.  Recommend  tonic water at night. Hyperlipidemia: On Lipitor, not fasting, has a hard time moving around so will proceed w/ non fasting labs: lipids, LFTs  RTC 2 months (to check on BPs)

## 2017-12-25 ENCOUNTER — Other Ambulatory Visit: Payer: Medicare Other

## 2017-12-25 DIAGNOSIS — E118 Type 2 diabetes mellitus with unspecified complications: Secondary | ICD-10-CM

## 2017-12-25 LAB — BASIC METABOLIC PANEL
BUN: 28 mg/dL — ABNORMAL HIGH (ref 6–23)
CO2: 31 mEq/L (ref 19–32)
CREATININE: 0.87 mg/dL (ref 0.40–1.20)
Calcium: 11.1 mg/dL — ABNORMAL HIGH (ref 8.4–10.5)
Chloride: 99 mEq/L (ref 96–112)
GFR: 65.91 mL/min (ref >60.00)
Glucose, Bld: 138 mg/dL — ABNORMAL HIGH (ref 70–99)
Potassium: 4.6 mEq/L (ref 3.5–5.1)
SODIUM: 138 meq/L (ref 135–145)

## 2017-12-25 LAB — LIPID PANEL
CHOLESTEROL: 148 mg/dL (ref 0–200)
HDL: 61.7 mg/dL (ref >39.00)
LDL Cholesterol: 57 mg/dL (ref 0–99)
NONHDL: 86.4
Total CHOL/HDL Ratio: 2
Triglycerides: 145 mg/dL (ref 0.0–149.0)
VLDL: 29 mg/dL (ref 0.0–40.0)

## 2017-12-25 LAB — AST: AST: 21 U/L (ref 0–37)

## 2017-12-25 LAB — ALT: ALT: 16 U/L (ref 0–35)

## 2017-12-25 LAB — MAGNESIUM: MAGNESIUM: 1.5 mg/dL (ref 1.5–2.5)

## 2017-12-25 NOTE — Assessment & Plan Note (Signed)
DM: Last A1c 7.7, metformin increased to 500 mg 1.5 tablets twice a day, has a hard time breaking the pill, which results will consider change to metformin 850 mg B.I.D..  No ambulatory CBGs, request a glucometer.  Foot exam negative.  Check a A1c HTN: Very good ambulatory BPs, currently on amlodipine, carvedilol, clonidine, HCTZ, losartan.  Has developed symmetric lower extremity edema, no CHF type of symptoms, will stop amlodipine, low-salt diet, leg elevation, monitor BPs.  Consider increase clonidine. Check a BMP and a magnesian due to leg cramps.  Recommend tonic water at night. Hyperlipidemia: On Lipitor, not fasting, has a hard time moving around so will proceed w/ non fasting labs: lipids, LFTs  RTC 2 months (to check on BPs)

## 2017-12-26 LAB — HEMOGLOBIN A1C
EAG (MMOL/L): 9.3 (calc)
Hgb A1c MFr Bld: 7.5 % of total Hgb — ABNORMAL HIGH (ref <5.7)
MEAN PLASMA GLUCOSE: 169 (calc)

## 2017-12-27 MED ORDER — METFORMIN HCL 1000 MG PO TABS: 1000.0000 mg | ORAL_TABLET | Freq: Two times a day (BID) | ORAL | 1 refills | Status: DC

## 2017-12-27 NOTE — Addendum Note (Signed)
Addended byConrad Summerfield: Keimora Swartout D on: 12/27/2017 01:33 PM   Modules accepted: Orders

## 2018-01-02 ENCOUNTER — Other Ambulatory Visit: Payer: Self-pay | Admitting: Internal Medicine

## 2018-01-02 DIAGNOSIS — Z961 Presence of intraocular lens: Secondary | ICD-10-CM | POA: Diagnosis not present

## 2018-01-02 DIAGNOSIS — H353131 Nonexudative age-related macular degeneration, bilateral, early dry stage: Secondary | ICD-10-CM | POA: Diagnosis not present

## 2018-01-02 DIAGNOSIS — E119 Type 2 diabetes mellitus without complications: Secondary | ICD-10-CM | POA: Diagnosis not present

## 2018-01-02 LAB — HM DIABETES EYE EXAM

## 2018-01-03 ENCOUNTER — Encounter: Payer: Self-pay | Admitting: Internal Medicine

## 2018-01-04 ENCOUNTER — Telehealth: Payer: Self-pay | Admitting: Internal Medicine

## 2018-01-04 MED ORDER — CARVEDILOL 25 MG PO TABS: 25.0000 mg | ORAL_TABLET | Freq: Two times a day (BID) | ORAL | 5 refills | Status: DC

## 2018-01-04 NOTE — Telephone Encounter (Signed)
Copied from CRM [phone]. Topic: Quick Communication - Rx Refill/Question >> Jan 04, 2018  2:15 PM Raquel SarnaHayes, Teresa G wrote: carvedilol (COREG) 25 MG tablet  Please fill Rx - pt has been waiting since Tues.  [facility] [address] - [place], [place] - [address] [place] [id number] Phone: [phone] Fax: [phone] Not a 24 hour pharmacy; exact hours not known

## 2018-01-04 NOTE — Telephone Encounter (Signed)
Medication filled on 01/04/18 

## 2018-01-16 ENCOUNTER — Other Ambulatory Visit: Payer: Self-pay

## 2018-01-16 MED ORDER — ATORVASTATIN CALCIUM 10 MG PO TABS: 10.0000 mg | ORAL_TABLET | Freq: Every day | ORAL | 1 refills | Status: DC

## 2018-01-24 NOTE — Progress Notes (Addendum)
Subjective:   Alison Graves is a 84 y.o. female who presents for Medicare Annual (Subsequent) preventive examination.  Pt's daughter is here with her and helps communicate due to language barrier.  Review of Systems: No ROS.  Medicare Wellness Visit. Additional risk factors are reflected in the social history. Cardiac Risk Factors include: advanced age (>86mn, >>71women);diabetes mellitus;dyslipidemia;hypertension Sleep patterns:  No issues Home Safety/Smoke Alarms: Feels safe in home. Smoke alarms in place.  Living environment; residence and Firearm Safety: lives in 2 story home with 2 daughters. Step over tub. Shower bench. Eye- last 3 weeks ago per pt.  Female:        Mammo-utd       Dexa scan-utd            Objective:     Vitals: BP 116/62 (BP Location: Left Arm, Patient Position: Sitting, Cuff Size: Normal)   Pulse 66   Ht 4' 10"  (1.473 m)   Wt 132 lb 6.4 oz (60.1 kg)   SpO2 (!) 6%   BMI 27.67 kg/m   Body mass index is 27.67 kg/m.  Advanced Directives 02/01/2018 06/28/2017 01/25/2017 01/31/2016  Does Patient Have a Medical Advance Directive? Yes No Yes No  Type of AParamedicof APingreeLiving will - [place]onLiving will -  Does patient want to make changes to medical advance directive? No - Patient declined - - -  Copy of [place] Chart? No - copy requested - No - copy requested -    Tobacco Social History   Tobacco Use  Smoking Status Never Smoker  Smokeless Tobacco Never Used     Counseling given: Not Answered   Clinical Intake:     Pain : No/denies pain      Past Medical History:  Diagnosis Date  . Hemorrhoids   . HTN (hypertension)   . Hyperlipemia   . Microhematuria    s/p eval DR Tannenbaum 2004  . Osteoarthritis   . PMR (polymyalgia rheumatica) (HCC)    ?   Past Surgical History:  Procedure Laterality Date  . BREAST BIOPSY  2005   benign  . CATARACT EXTRACTION      . LAPAROSCOPIC HYSTERECTOMY     per Dr FToney Rakes ?oophorectomy   Family History  Problem Relation Age of Onset  . Coronary artery disease Mother        early in life   . Diabetes Other        grandmother  . Stroke Neg Hx   . Hypertension Neg Hx   . Cancer Neg Hx    Social History   Socioeconomic History  . Marital status: Widowed    Spouse name: Not on file  . Number of children: 3  . Years of education: Not on file  . Highest education level: Not on file  Occupational History  . 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 Food insecurity:    Worry: Not on file    Inability: Not on file  . Transportation needs:    Medical: Not on file    Non-medical: Not on file  Tobacco Use  . Smoking status: Never Smoker  . Smokeless tobacco: Never Used  Substance and Sexual Activity  . Alcohol use: No  . Drug use: No  . Sexual activity: Not Currently    Birth control/protection: Surgical  Lifestyle  . Physical activity:    Days per week:  Not on file    Minutes per session: Not on file  . Stress: Not on file  Relationships  . Social connections:    Talks on phone: Not on file    Gets together: Not on file    Attends religious service: Not on file    Active member of club or organization: Not on file    Attends meetings of clubs or organizations: Not on file    Relationship status: Not on file  Other Topics Concern  . Not on file  Social History Narrative   Widow,  Original from [place] , lives w/ 2 of her 3 daughters    Outpatient Encounter Medications as of 02/01/2018  Medication Sig  . acetaminophen (TYLENOL) 650 MG CR tablet Take 650 mg by mouth every 8 (eight) hours as needed. Reported on 01/18/2016  . aspirin 81 MG tablet Take 81 mg by mouth daily.    Marland Kitchen atorvastatin (LIPITOR) 10 MG tablet Take 1 tablet (10 mg total) by mouth daily.  . Blood Glucose Monitoring Suppl ([place]) w/Device KIT Check blood sugar  once daily  . Blood Pressure Monitoring (BLOOD PRESSURE MONITOR AUTOMAT) DEVI Check blood pressure daily as directed  . Calcium Carbonate-Vitamin D (CALCIUM 600 + D PO) Take 3 tablets by mouth daily.   . carvedilol (COREG) 25 MG tablet Take 1 tablet (25 mg total) by mouth 2 (two) times daily with a meal.  . Cholecalciferol (VITAMIN D3) 5000 units TABS Take 4 tablets by mouth daily.  . cloNIDine (CATAPRES) 0.3 MG tablet Take 1 tablet (0.3 mg total) by mouth 3 (three) times daily.  . fish oil-omega-3 fatty acids 1000 MG capsule Take 4 g by mouth daily. Reported on 01/18/2016  . glucose blood (ONETOUCH VERIO) test strip Check blood sugar once daily  . losartan (COZAAR) 100 MG tablet Take 1 tablet (100 mg total) by mouth daily.  . metFORMIN (GLUCOPHAGE) 1000 MG tablet Take 1 tablet (1,000 mg total) by mouth 2 (two) times daily with a meal.  . Multiple Vitamin (MULTIVITAMIN PO) Take 1 tablet by mouth daily.   . ONE TOUCH LANCETS MISC Check blood sugar once daily  . hydrochlorothiazide (HYDRODIURIL) 25 MG tablet Take 1 tablet (25 mg total) by mouth daily. (Patient not taking: Reported on 02/01/2018)   No facility-administered encounter medications on file as of 02/01/2018.     Activities of Daily Living In your present state of health, do you have any difficulty performing the following activities: 02/01/2018  Hearing? N  Vision? N  Difficulty concentrating or making decisions? N  Walking or climbing stairs? Y  Dressing or bathing? N  Doing errands, shopping? N  Preparing Food and eating ? N  Using the Toilet? N  In the past six months, have you accidently leaked urine? Y  Do you have problems with loss of bowel control? N  Managing your Medications? N  Managing your Finances? N  Housekeeping or managing your Housekeeping? N  Some recent data might be hidden    Patient Care Team: Colon Branch, MD as PCP - General Clent Jacks, MD as Consulting Physician (Ophthalmology) Bo Merino, MD  as Consulting Physician (Rheumatology) [facility], [facility] as Audiologist (Audiology)    Assessment:   This is a routine wellness examination for Alison Graves. Physical assessment deferred to PCP.  Exercise Activities and Dietary recommendations Current Exercise Habits: The patient does not participate in regular exercise at present, Exercise limited by: None identified Diet (  meal preparation, eat out, water intake, caffeinated beverages, dairy products, fruits and vegetables): well balanced   Goals    . Patient Stated     Maintain current health       Fall Risk Fall Risk  02/01/2018 01/25/2017 01/18/2016 12/16/2014 11/05/2013  Falls in the past year? Yes Yes No No No  Number falls in past yr: 1 1 - - -  Follow up Education provided;Falls prevention discussed Education provided;Falls prevention discussed - - -     Depression Screen PHQ 2/9 Scores 02/01/2018 01/25/2017 01/18/2016 12/16/2014  PHQ - 2 Score 0 0 0 0     Cognitive Function MMSE - Mini Mental State Exam 02/01/2018 01/25/2017  Not completed: Unable to complete -  Orientation to time - 5  Orientation to Place - 5  Registration - 3  Attention/ Calculation - 5  Recall - 2  Language- name 2 objects - 2  Language- repeat - 1  Language- follow 3 step command - 3  Language- read & follow direction - 1  Write a sentence - 1        Immunization History  Administered Date(s) Administered  . H1N1 07/29/2008  . Influenza Whole 06/13/2007, 04/24/2008, 04/19/2009, 04/06/2010  . Influenza, High Dose Seasonal PF 05/24/2016, 06/29/2017  . Influenza, Seasonal, Injecte, Preservative Fre 05/02/2013  . Influenza,inj,Quad PF,6+ Mos 06/11/2014  . Pneumococcal Conjugate-13 11/05/2013  . Pneumococcal Polysaccharide-23 07/29/2008  . Td 08/17/2009  . Zoster 09/25/2008   Screening Tests Health Maintenance  Topic Date Due  . OPHTHALMOLOGY EXAM  11/05/1941  . INFLUENZA VACCINE  01/31/2018  . HEMOGLOBIN A1C  06/26/2018  .  FOOT EXAM  12/25/2018  . TETANUS/TDAP  08/18/2019  . DEXA SCAN  Completed  . PNA vac Low Risk Adult  Completed       Plan:    Please schedule your next medicare wellness visit with me in 1 yr.  Continue to eat heart healthy diet (full of fruits, vegetables, whole grains, lean protein, water--limit salt, fat, and sugar intake) and increase physical activity as tolerated.  Continue doing brain stimulating activities (puzzles, reading, adult coloring books, staying active) to keep memory sharp.   Pt having some left neck discomfort when she turns her head to the left. Dr.Paz made aware. No new orders. Pt instructed to schedule appt with PCP.  I have personally reviewed and noted the following in the patient's chart:   . Medical and social history . Use of alcohol, tobacco or illicit drugs  . Current medications and supplements . Functional ability and status . Nutritional status . Physical activity . Advanced directives . List of other physicians . Hospitalizations, surgeries, and ER visits in previous 12 months . Vitals . Screenings to include cognitive, depression, and falls . Referrals and appointments  In addition, I have reviewed and discussed with patient certain preventive protocols, quality metrics, and best practice recommendations. A written personalized care plan for preventive services as well as general preventive health recommendations were provided to patient.     Shalah, Estelle [place], [place]  02/01/2018  Kathlene November, MD

## 2018-01-28 ENCOUNTER — Ambulatory Visit: Payer: Medicare Other | Admitting: *Deleted

## 2018-02-01 ENCOUNTER — Encounter: Payer: Self-pay | Admitting: *Deleted

## 2018-02-01 ENCOUNTER — Ambulatory Visit (INDEPENDENT_AMBULATORY_CARE_PROVIDER_SITE_OTHER): Payer: Medicare Other | Admitting: *Deleted

## 2018-02-01 ENCOUNTER — Other Ambulatory Visit: Payer: Self-pay | Admitting: Internal Medicine

## 2018-02-01 VITALS — BP 116/62 | HR 66 | Ht <= 58 in | Wt 132.4 lb

## 2018-02-01 DIAGNOSIS — Z0000 Encounter for general adult medical examination without abnormal findings: Secondary | ICD-10-CM | POA: Diagnosis not present

## 2018-02-01 NOTE — Patient Instructions (Addendum)
Please schedule your next medicare wellness visit with me in 1 yr.  Continue to eat heart healthy diet (full of fruits, vegetables, whole grains, lean protein, water--limit salt, fat, and sugar intake) and increase physical activity as tolerated.  Continue doing brain stimulating activities (puzzles, reading, adult coloring books, staying active) to keep memory sharp.    Alison Graves , Thank you for taking time to come for your Medicare Wellness Visit. I appreciate your ongoing commitment to your health goals. Please review the following plan we discussed and let me know if I can assist you in the future.   These are the goals we discussed: Goals    . Patient Stated     Maintain current health       This is a list of the screening recommended for you and due dates:  Health Maintenance  Topic Date Due  . Eye exam for diabetics  11/05/1941  . Flu Shot  01/31/2018  . Hemoglobin A1C  06/26/2018  . Complete foot exam   12/25/2018  . Tetanus Vaccine  08/18/2019  . DEXA scan (bone density measurement)  Completed  . Pneumonia vaccines  Completed    Health Maintenance for Postmenopausal Women Menopause is a normal process in which your reproductive ability comes to an end. This process happens gradually over a span of months to years, usually between the ages of 48 and 55. Menopause is complete when you have missed 12 consecutive menstrual periods. It is important to talk with your health care provider about some of the most common conditions that affect postmenopausal women, such as heart disease, cancer, and bone loss (osteoporosis). Adopting a healthy lifestyle and getting preventive care can help to promote your health and wellness. Those actions can also lower your chances of developing some of these common conditions. What should I know about menopause? During menopause, you may experience a number of symptoms, such as:  Moderate-to-severe hot flashes.  Night sweats.  Decrease in  sex drive.  Mood swings.  Headaches.  Tiredness.  Irritability.  Memory problems.  Insomnia.  Choosing to treat or not to treat menopausal changes is an individual decision that you make with your health care provider. What should I know about hormone replacement therapy and supplements? Hormone therapy products are effective for treating symptoms that are associated with menopause, such as hot flashes and night sweats. Hormone replacement carries certain risks, especially as you become older. If you are thinking about using estrogen or estrogen with progestin treatments, discuss the benefits and risks with your health care provider. What should I know about heart disease and stroke? Heart disease, heart attack, and stroke become more likely as you age. This may be due, in part, to the hormonal changes that your body experiences during menopause. These can affect how your body processes dietary fats, triglycerides, and cholesterol. Heart attack and stroke are both medical emergencies. There are many things that you can do to help prevent heart disease and stroke:  Have your blood pressure checked at least every 1-2 years. High blood pressure causes heart disease and increases the risk of stroke.  If you are 55-79 years old, ask your health care provider if you should take aspirin to prevent a heart attack or a stroke.  Do not use any tobacco products, including cigarettes, chewing tobacco, or electronic cigarettes. If you need help quitting, ask your health care provider.  It is important to eat a healthy diet and maintain a healthy weight. ? Be sure   to include plenty of vegetables, fruits, low-fat dairy products, and lean protein. ? Avoid eating foods that are high in solid fats, added sugars, or salt (sodium).  Get regular exercise. This is one of the most important things that you can do for your health. ? Try to exercise for at least 150 minutes each week. The type of exercise  that you do should increase your heart rate and make you sweat. This is known as moderate-intensity exercise. ? Try to do strengthening exercises at least twice each week. Do these in addition to the moderate-intensity exercise.  Know your numbers.Ask your health care provider to check your cholesterol and your blood glucose. Continue to have your blood tested as directed by your health care provider.  What should I know about cancer screening? There are several types of cancer. Take the following steps to reduce your risk and to catch any cancer development as early as possible. Breast Cancer  Practice breast self-awareness. ? This means understanding how your breasts normally appear and feel. ? It also means doing regular breast self-exams. Let your health care provider know about any changes, no matter how small.  If you are 1 or older, have a clinician do a breast exam (clinical breast exam or CBE) every year. Depending on your age, family history, and medical history, it may be recommended that you also have a yearly breast X-ray (mammogram).  If you have a family history of breast cancer, talk with your health care provider about genetic screening.  If you are at high risk for breast cancer, talk with your health care provider about having an MRI and a mammogram every year.  Breast cancer (BRCA) gene test is recommended for women who have family members with BRCA-related cancers. Results of the assessment will determine the need for genetic counseling and BRCA1 and for BRCA2 testing. BRCA-related cancers include these types: ? Breast. This occurs in males or females. ? Ovarian. ? Tubal. This may also be called fallopian tube cancer. ? Cancer of the abdominal or pelvic lining (peritoneal cancer). ? Prostate. ? Pancreatic.  Cervical, Uterine, and Ovarian Cancer Your health care provider may recommend that you be screened regularly for cancer of the pelvic organs. These include your  ovaries, uterus, and vagina. This screening involves a pelvic exam, which includes checking for microscopic changes to the surface of your cervix (Pap test).  For women ages 21-65, health care providers may recommend a pelvic exam and a Pap test every three years. For women ages 9-65, they may recommend the Pap test and pelvic exam, combined with testing for human papilloma virus (HPV), every five years. Some types of HPV increase your risk of cervical cancer. Testing for HPV may also be done on women of any age who have unclear Pap test results.  Other health care providers may not recommend any screening for nonpregnant women who are considered low risk for pelvic cancer and have no symptoms. Ask your health care provider if a screening pelvic exam is right for you.  If you have had past treatment for cervical cancer or a condition that could lead to cancer, you need Pap tests and screening for cancer for at least 20 years after your treatment. If Pap tests have been discontinued for you, your risk factors (such as having a new sexual partner) need to be reassessed to determine if you should start having screenings again. Some women have medical problems that increase the chance of getting cervical cancer. In these  cases, your health care provider may recommend that you have screening and Pap tests more often.  If you have a family history of uterine cancer or ovarian cancer, talk with your health care provider about genetic screening.  If you have vaginal bleeding after reaching menopause, tell your health care provider.  There are currently no reliable tests available to screen for ovarian cancer.  Lung Cancer Lung cancer screening is recommended for adults 55-80 years old who are at high risk for lung cancer because of a history of smoking. A yearly low-dose CT scan of the lungs is recommended if you:  Currently smoke.  Have a history of at least 30 pack-years of smoking and you currently  smoke or have quit within the past 15 years. A pack-year is smoking an average of one pack of cigarettes per day for one year.  Yearly screening should:  Continue until it has been 15 years since you quit.  Stop if you develop a health problem that would prevent you from having lung cancer treatment.  Colorectal Cancer  This type of cancer can be detected and can often be prevented.  Routine colorectal cancer screening usually begins at age 50 and continues through age 75.  If you have risk factors for colon cancer, your health care provider may recommend that you be screened at an earlier age.  If you have a family history of colorectal cancer, talk with your health care provider about genetic screening.  Your health care provider may also recommend using home test kits to check for hidden blood in your stool.  A small camera at the end of a tube can be used to examine your colon directly (sigmoidoscopy or colonoscopy). This is done to check for the earliest forms of colorectal cancer.  Direct examination of the colon should be repeated every 5-10 years until age 75. However, if early forms of precancerous polyps or small growths are found or if you have a family history or genetic risk for colorectal cancer, you may need to be screened more often.  Skin Cancer  Check your skin from head to toe regularly.  Monitor any moles. Be sure to tell your health care provider: ? About any new moles or changes in moles, especially if there is a change in a mole's shape or color. ? If you have a mole that is larger than the size of a pencil eraser.  If any of your family members has a history of skin cancer, especially at a young age, talk with your health care provider about genetic screening.  Always use sunscreen. Apply sunscreen liberally and repeatedly throughout the day.  Whenever you are outside, protect yourself by wearing long sleeves, pants, a wide-brimmed hat, and  sunglasses.  What should I know about osteoporosis? Osteoporosis is a condition in which bone destruction happens more quickly than new bone creation. After menopause, you may be at an increased risk for osteoporosis. To help prevent osteoporosis or the bone fractures that can happen because of osteoporosis, the following is recommended:  If you are 19-50 years old, get at least 1,000 mg of calcium and at least 600 mg of vitamin D per day.  If you are older than age 50 but younger than age 70, get at least 1,200 mg of calcium and at least 600 mg of vitamin D per day.  If you are older than age 70, get at least 1,200 mg of calcium and at least 800 mg of vitamin D per   day.  Smoking and excessive alcohol intake increase the risk of osteoporosis. Eat foods that are rich in calcium and vitamin D, and do weight-bearing exercises several times each week as directed by your health care provider. What should I know about how menopause affects my mental health? Depression may occur at any age, but it is more common as you become older. Common symptoms of depression include:  Low or sad mood.  Changes in sleep patterns.  Changes in appetite or eating patterns.  Feeling an overall lack of motivation or enjoyment of activities that you previously enjoyed.  Frequent crying spells.  Talk with your health care provider if you think that you are experiencing depression. What should I know about immunizations? It is important that you get and maintain your immunizations. These include:  Tetanus, diphtheria, and pertussis (Tdap) booster vaccine.  Influenza every year before the flu season begins.  Pneumonia vaccine.  Shingles vaccine.  Your health care provider may also recommend other immunizations. This information is not intended to replace advice given to you by your health care provider. Make sure you discuss any questions you have with your health care provider. Document Released:  08/11/2005 Document Revised: 01/07/2016 Document Reviewed: 03/23/2015 Elsevier Interactive Patient Education  2018 [employer]  

## 2018-02-06 ENCOUNTER — Encounter: Payer: Self-pay | Admitting: Internal Medicine

## 2018-02-06 ENCOUNTER — Ambulatory Visit (INDEPENDENT_AMBULATORY_CARE_PROVIDER_SITE_OTHER): Payer: Medicare Other | Admitting: Internal Medicine

## 2018-02-06 VITALS — BP 126/68 | HR 71 | Temp 98.0°F | Resp 16 | Ht <= 58 in | Wt 131.4 lb

## 2018-02-06 DIAGNOSIS — M791 Myalgia, unspecified site: Secondary | ICD-10-CM

## 2018-02-06 LAB — BASIC METABOLIC PANEL
BUN: 26 mg/dL — AB (ref 6–23)
CHLORIDE: 102 meq/L (ref 96–112)
CO2: 32 mEq/L (ref 19–32)
CREATININE: 0.83 mg/dL (ref 0.40–1.20)
Calcium: 10.3 mg/dL (ref 8.4–10.5)
GFR: 69.57 mL/min (ref >60.00)
GLUCOSE: 145 mg/dL — AB (ref 70–99)
POTASSIUM: 3.6 meq/L (ref 3.5–5.1)
Sodium: 141 mEq/L (ref 135–145)

## 2018-02-06 LAB — CBC WITH DIFFERENTIAL/PLATELET
BASOS PCT: 1.9 % (ref 0.0–3.0)
Basophils Absolute: 0.1 10*3/uL (ref 0.0–0.1)
EOS ABS: 0.4 10*3/uL (ref 0.0–0.7)
EOS PCT: 5.8 % — AB (ref 0.0–5.0)
HCT: 37.9 % (ref 36.0–46.0)
Hemoglobin: 12.9 g/dL (ref 12.0–15.0)
LYMPHS ABS: 1.9 10*3/uL (ref 0.7–4.0)
Lymphocytes Relative: 31.5 % (ref 12.0–46.0)
MCHC: 34 g/dL (ref 30.0–36.0)
MCV: 90.3 fl (ref 78.0–100.0)
MONO ABS: 0.4 10*3/uL (ref 0.1–1.0)
Monocytes Relative: 6.6 % (ref 3.0–12.0)
NEUTROS ABS: 3.3 10*3/uL (ref 1.4–7.7)
NEUTROS PCT: 54.2 % (ref 43.0–77.0)
PLATELETS: 289 10*3/uL (ref 150.0–400.0)
RBC: 4.2 Mil/uL (ref 3.87–5.11)
RDW: 14 % (ref 11.5–15.5)
WBC: 6.1 10*3/uL (ref 4.0–10.5)

## 2018-02-06 LAB — SEDIMENTATION RATE: SED RATE: 18 mm/h (ref 0–30)

## 2018-02-06 LAB — CK: CK TOTAL: 207 U/L — AB (ref 7–177)

## 2018-02-06 NOTE — Progress Notes (Signed)
Pre visit review using our clinic review tool, if applicable. No additional management support is needed unless otherwise documented below in the visit note. 

## 2018-02-06 NOTE — Patient Instructions (Addendum)
GO TO THE LAB : Get the blood work     GO TO THE FRONT DESK Schedule your next appointment for a checkup in 3 weeks  Hold Lipitor  Tylenol  500 mg OTC 2 tabs a day every 8 hours as needed for pain  Go to the ER if increased symptoms, fever, headaches, rash

## 2018-02-06 NOTE — Progress Notes (Signed)
Subjective:    Patient ID: Alison Graves, female    DOB: September 08, 1931, 84 y.o.   MRN: [id number]  DOS:  02/06/2018 Type of visit - description : acute Interval history: The patient has extensive DJD with chronic pain however for the last 3 weeks she has had different  symptoms. Last week she had neck pain, worse when she turns her head, overall neck pain has decreased. Has also been experiencing myalgias at the upper and lower extremities, on and off, in different places. Last week had a episode of diarrhea described as loose stools.  It was associated with some upper abdominal discomfort.  No blood in the stools.  Symptoms are already better. Has chronic right hand numbness, for at least a year, numbness has been gradually spreading proximally and now she is somewhat numb from the elbow down on the right side.    Wt Readings from Last 3 Encounters:  02/06/18 131 lb 6 oz (59.6 kg)  02/01/18 132 lb 6.4 oz (60.1 kg)  12/24/17 139 lb 2 oz (63.1 kg)     Review of Systems Denies fever or chills Has mild and sporadic headaches, at baseline. She does have some visual disturbances, no new, no amaurosis fugax per se.   Denies jaw claudication. Some weight loss noted. No LUTS No URI type of sxs  Past Medical History:  Diagnosis Date  . Hemorrhoids   . HTN (hypertension)   . Hyperlipemia   . Microhematuria    s/p eval DR Tannenbaum 2004  . Osteoarthritis   . PMR (polymyalgia rheumatica) (HCC)    ?    Past Surgical History:  Procedure Laterality Date  . BREAST BIOPSY  2005   benign  . CATARACT EXTRACTION    . LAPAROSCOPIC HYSTERECTOMY     per Dr Toney Rakes, ?oophorectomy    Social History   Socioeconomic History  . Marital status: Widowed    Spouse name: Not on file  . Number of children: 3  . Years of education: Not on file  . Highest education level: Not on file  Occupational History  . Occupation: retired   Scientific laboratory technician  . Financial resource strain: Not on file    . Food insecurity:    Worry: Not on file    Inability: Not on file  . Transportation needs:    Medical: Not on file    Non-medical: Not on file  Tobacco Use  . Smoking status: Never Smoker  . Smokeless tobacco: Never Used  Substance and Sexual Activity  . Alcohol use: No  . Drug use: No  . Sexual activity: Not Currently    Birth control/protection: Surgical  Lifestyle  . Physical activity:    Days per week: Not on file    Minutes per session: Not on file  . Stress: Not on file  Relationships  . Social connections:    Talks on phone: Not on file    Gets together: Not on file    Attends religious service: Not on file    Active member of club or organization: Not on file    Attends meetings of clubs or organizations: Not on file    Relationship status: Not on file  . Intimate partner violence:    Fear of current or ex partner: Not on file    Emotionally abused: Not on file    Physically abused: Not on file    Forced sexual activity: Not on file  Other Topics Concern  . Not on file  Social History Narrative   Widow,  Original from [place] , lives w/ 2 of her 3 daughters      Allergies as of 02/06/2018   No Known Allergies     Medication List        Accurate as of 02/06/18  4:00 PM. Always use your most recent med list.          acetaminophen 650 MG CR tablet Commonly known as:  TYLENOL Take 650 mg by mouth every 8 (eight) hours as needed. Reported on 01/18/2016   aspirin 81 MG tablet Take 81 mg by mouth daily.   atorvastatin 10 MG tablet Commonly known as:  LIPITOR Take 1 tablet (10 mg total) by mouth daily.   Blood Pressure Monitor Automat Devi Check blood pressure daily as directed   CALCIUM 600 + D PO Take 3 tablets by mouth daily.   carvedilol 25 MG tablet Commonly known as:  COREG Take 1 tablet (25 mg total) by mouth 2 (two) times daily with a meal.   cloNIDine 0.3 MG tablet Commonly known as:  CATAPRES Take 1 tablet (0.3 mg total) by mouth 3  (three) times daily.   fish oil-omega-3 fatty acids 1000 MG capsule Take 4 g by mouth daily. Reported on 01/18/2016   glucose blood test strip Commonly known as:  ONETOUCH VERIO Check blood sugar once daily   hydrochlorothiazide 25 MG tablet Commonly known as:  HYDRODIURIL Take 1 tablet (25 mg total) by mouth daily.   losartan 100 MG tablet Commonly known as:  COZAAR Take 1 tablet (100 mg total) by mouth daily.   metFORMIN 1000 MG tablet Commonly known as:  GLUCOPHAGE Take 1 tablet (1,000 mg total) by mouth 2 (two) times daily with a meal.   MULTIVITAMIN PO Take 1 tablet by mouth daily.   ONE TOUCH LANCETS Misc Check blood sugar once daily   ONETOUCH VERIO FLEX SYSTEM w/Device Kit Check blood sugar once daily   Vitamin D3 5000 units Tabs Take 4 tablets by mouth daily.          Objective:   Physical Exam BP 126/68 (BP Location: Left Arm, Patient Position: Sitting, Cuff Size: Small)   Pulse 71   Temp 98 F (36.7 C) (Oral)   Resp 16   Ht _0  (1.473 m)   Wt 131 lb 6 oz (59.6 kg)   SpO2 97%   BMI 27.46 kg/m  General:   Well developed, NAD, see BMI.  HEENT:  Normocephalic . Face symmetric, atraumatic Temples: Present TA pulses, no TTP Lungs:  CTA B Normal respiratory effort, no intercostal retractions, no accessory muscle use. Heart: RRR,  no murmur.  no pretibial edema bilaterally  Abdomen:  Not distended, soft, non-tender. No rebound or rigidity.   MSK: Hands and wrists with no synovitis, extensive changes consistent with DJD Neck: Range of motion globally decreased, mildly. Skin: Not pale. Not jaundice Neurologic:  alert & oriented X3.  Speech normal, gait very limited by DJD, assisted by a cane.  Motor symmetric Psych--  Cognition and judgment appear intact.  Cooperative with normal attention span and concentration.  Behavior appropriate. No anxious or depressed appearing.     Assessment & Plan:   Assessment DM HTN Hyperlipidemia LE  edema  Microhematuria, urology eval 2004 Osteopenia-- t score -1.1 2011, t score normal 10-2015 DJD - knees H/o PMR  Years ago, saw rheumatology (Dr Rockwell [place]) , dx PMR with normal sed rate- CRP but elevated CK. Responded to steroids  PLAN: Myalgias: 84 year old lady, chronic pain due to DJD presents with mild myalgias for the last 3 weeks.  Had exacerbation of neck pain but that seems to be better, had a episode of diarrhea which is now resolved. She has a history of PMR.  Last sed rate was 10 (2017) Plan:  Stat BMP, CBC, sed rate, total CK. Hold Lipitor  If labs supportive of PMR will start prednisone and refer to rheumatology ASAP.  If not will asked patient to come back in 3 weeks for re-eval and take Tylenol. ER if severe symptoms.  See AVS Numbness, right hand: Slightly worse lately, going on for more than a year.  Reassess on RTC.

## 2018-02-06 NOTE — Assessment & Plan Note (Signed)
Myalgias: 84 year old lady, chronic pain due to DJD presents with mild myalgias for the last 3 weeks.  Had exacerbation of neck pain but that seems to be better, had a episode of diarrhea which is now resolved. She has a history of PMR.  Last sed rate was 10 (2017) Plan:  Stat BMP, CBC, sed rate, total CK. Hold Lipitor  If labs supportive of PMR will start prednisone and refer to rheumatology ASAP.  If not will asked patient to come back in 3 weeks for re-eval and take Tylenol. ER if severe symptoms.  See AVS Numbness, right hand: Slightly worse lately, going on for more than a year.  Reassess on RTC.

## 2018-02-13 ENCOUNTER — Ambulatory Visit: Payer: Medicare Other | Admitting: Medical

## 2018-02-25 ENCOUNTER — Ambulatory Visit: Payer: Medicare Other | Admitting: Internal Medicine

## 2018-03-01 ENCOUNTER — Ambulatory Visit (INDEPENDENT_AMBULATORY_CARE_PROVIDER_SITE_OTHER): Payer: Medicare Other | Admitting: Internal Medicine

## 2018-03-01 ENCOUNTER — Encounter: Payer: Self-pay | Admitting: Internal Medicine

## 2018-03-01 VITALS — BP 150/78 | HR 69 | Temp 97.9°F | Resp 16 | Ht <= 58 in | Wt 131.0 lb

## 2018-03-01 DIAGNOSIS — M791 Myalgia, unspecified site: Secondary | ICD-10-CM

## 2018-03-01 DIAGNOSIS — H918X3 Other specified hearing loss, bilateral: Secondary | ICD-10-CM | POA: Diagnosis not present

## 2018-03-01 NOTE — Progress Notes (Signed)
Subjective:    Patient ID: Alison Graves, female    DOB: 05-23-1932, 84 y.o.   MRN: [id number]  DOS:  03/01/2018 Type of visit - description : f/u Interval history: Since the last OV, overall feels better, continue with aches and pains but they are not severe. Also, requests a referral for audiology.   Wt Readings from Last 3 Encounters:  03/01/18 131 lb (59.4 kg)  02/06/18 131 lb 6 oz (59.6 kg)  02/01/18 132 lb 6.4 oz (60.1 kg)    Review of Systems Again denies any fever, chills.  No weight loss No rash. Ambulatory BPs are normal.   Past Medical History:  Diagnosis Date  . Hemorrhoids   . HTN (hypertension)   . Hyperlipemia   . Microhematuria    s/p eval DR Tannenbaum 2004  . Osteoarthritis   . PMR (polymyalgia rheumatica) (HCC)    ?    Past Surgical History:  Procedure Laterality Date  . BREAST BIOPSY  2005   benign  . CATARACT EXTRACTION    . LAPAROSCOPIC HYSTERECTOMY     per Dr Toney Rakes, ?oophorectomy    Social History   Socioeconomic History  . Marital status: Widowed    Spouse name: Not on file  . Number of children: 3  . Years of education: Not on file  . Highest education level: Not on file  Occupational History  . Occupation: retired   Scientific laboratory technician  . Financial resource strain: Not on file  . Food insecurity:    Worry: Not on file    Inability: Not on file  . Transportation needs:    Medical: Not on file    Non-medical: Not on file  Tobacco Use  . Smoking status: Never Smoker  . Smokeless tobacco: Never Used  Substance and Sexual Activity  . Alcohol use: No  . Drug use: No  . Sexual activity: Not Currently    Birth control/protection: Surgical  Lifestyle  . Physical activity:    Days per week: Not on file    Minutes per session: Not on file  . Stress: Not on file  Relationships  . Social connections:    Talks on phone: Not on file    Gets together: Not on file    Attends religious service: Not on file    Active member of  club or organization: Not on file    Attends meetings of clubs or organizations: Not on file    Relationship status: Not on file  . Intimate partner violence:    Fear of current or ex partner: Not on file    Emotionally abused: Not on file    Physically abused: Not on file    Forced sexual activity: Not on file  Other Topics Concern  . Not on file  Social History Narrative   Widow,  Original from [place] , lives w/ 2 of her 3 daughters      Allergies as of 03/01/2018   No Known Allergies     Medication List        Accurate as of 03/01/18 11:59 PM. Always use your most recent med list.          acetaminophen 650 MG CR tablet Commonly known as:  TYLENOL Take 650 mg by mouth every 8 (eight) hours as needed. Reported on 01/18/2016   aspirin 81 MG tablet Take 81 mg by mouth daily.   Blood Pressure Monitor Automat Devi Check blood pressure daily as directed   CALCIUM 600 +  D PO Take 3 tablets by mouth daily.   carvedilol 25 MG tablet Commonly known as:  COREG Take 1 tablet (25 mg total) by mouth 2 (two) times daily with a meal.   cloNIDine 0.3 MG tablet Commonly known as:  CATAPRES Take 1 tablet (0.3 mg total) by mouth 3 (three) times daily.   fish oil-omega-3 fatty acids 1000 MG capsule Take 4 g by mouth daily. Reported on 01/18/2016   glucose blood test strip Check blood sugar once daily   hydrochlorothiazide 25 MG tablet Commonly known as:  HYDRODIURIL Take 1 tablet (25 mg total) by mouth daily.   losartan 100 MG tablet Commonly known as:  COZAAR Take 1 tablet (100 mg total) by mouth daily.   metFORMIN 1000 MG tablet Commonly known as:  GLUCOPHAGE Take 1 tablet (1,000 mg total) by mouth 2 (two) times daily with a meal.   MULTIVITAMIN PO Take 1 tablet by mouth daily.   ONE TOUCH LANCETS Misc Check blood sugar once daily   ONETOUCH VERIO FLEX SYSTEM w/Device Kit Check blood sugar once daily   Vitamin D3 5000 units Tabs Take 4 tablets by mouth  daily.          Objective:   Physical Exam BP (!) 150/78 (BP Location: Left Arm, Patient Position: Sitting, Cuff Size: Small)   Pulse 69   Temp 97.9 F (36.6 C) (Oral)   Resp 16   Ht 4' 10" (1.473 m)   Wt 131 lb (59.4 kg)   SpO2 97%   BMI 27.38 kg/m  General:   Well developed, NAD, see BMI.  HEENT:  Normocephalic . Face symmetric, atraumatic Lungs:  CTA B Normal respiratory effort, no intercostal retractions, no accessory muscle use. Heart: RRR,  no murmur.  no pretibial edema bilaterally  Skin: Not pale. Not jaundice Neurologic:  alert & oriented X3.  Speech normal, gait very limited by DJD, assisted by a cane.  Motor symmetric Psych--  Cognition and judgment appear intact.  Cooperative with normal attention span and concentration.  Behavior appropriate. No anxious or depressed appearing.    Assessment & Plan:   Assessment DM HTN Hyperlipidemia LE edema  Microhematuria, urology eval 2004 Osteopenia-- t score -1.1 2011, t score normal 10-2015 DJD - knees H/o PMR  Years ago, saw rheumatology (Dr Levitin) , dx PMR with normal sed rate- CRP but elevated CK. Responded to steroids  PLAN: Myalgias: See previous visit, work-up negative, Lipitor was stopped.  Overall the patient is slightly better.  Unclear if stopping Lipitor helped but for now will stop it. Encouraged to call me if something changes, specifically if headaches become more severe or if she has fever, chills or headache. HOH: Request a referral to be seen again at the [facility] in [address].  Referral sent. Also, she is going to have a dental procedure, wonder if taking ibuprofen 800 mg is okay.  I recommend to avoid high doses of NSAIDs, Tylenol is okay, if she must take ibuprofen , 400 mg a day should be the limit. RTC 4 months  

## 2018-03-01 NOTE — Patient Instructions (Addendum)
  GO TO THE FRONT DESK Schedule your next appointment for a  Follow up in 4 months  Stop lipitor   Tylenol is ok  Ibuprofen: try to avoid, if needed take 400 mg once a day for few days only

## 2018-03-03 NOTE — Assessment & Plan Note (Signed)
Myalgias: See previous visit, work-up negative, Lipitor was stopped.  Overall the patient is slightly better.  Unclear if stopping Lipitor helped but for now will stop it. Encouraged to call me if something changes, specifically if headaches become more severe or if she has fever, chills or headache. HOH: Request a referral to be seen again at the [facility] in [facility].  Referral sent. Also, she is going to have a dental procedure, wonder if taking ibuprofen 800 mg is okay.  I recommend to avoid high doses of NSAIDs, Tylenol is okay, if she must take ibuprofen , 400 mg a day should be the limit. RTC 4 months

## 2018-03-18 ENCOUNTER — Other Ambulatory Visit: Payer: Self-pay

## 2018-03-18 DIAGNOSIS — H9190 Unspecified hearing loss, unspecified ear: Secondary | ICD-10-CM

## 2018-05-07 ENCOUNTER — Other Ambulatory Visit: Payer: Self-pay | Admitting: Internal Medicine

## 2018-05-29 ENCOUNTER — Ambulatory Visit (INDEPENDENT_AMBULATORY_CARE_PROVIDER_SITE_OTHER): Payer: Medicare Other

## 2018-05-29 ENCOUNTER — Other Ambulatory Visit: Payer: Self-pay

## 2018-05-29 DIAGNOSIS — Z23 Encounter for immunization: Secondary | ICD-10-CM | POA: Diagnosis not present

## 2018-05-29 NOTE — Patient Outreach (Signed)
[facility]  05/29/2018  Alison DraftVictoria I Graves 01/17/1932 [id number]   Medication Adherence call to Mrs. TurkeyVictoria Yzaguirre left a message with patient's daughter patient is due on Atorvastatin 10 mg. Alison Graves is showing past due under [employer].   Alison AbedAna Graves CPhT Pharmacy Technician [facility] Care Management Direct Dial [phone]  Fax [phone] [email or web address]

## 2018-07-05 ENCOUNTER — Encounter: Payer: Self-pay | Admitting: Internal Medicine

## 2018-07-05 ENCOUNTER — Ambulatory Visit (INDEPENDENT_AMBULATORY_CARE_PROVIDER_SITE_OTHER): Payer: Medicare Other | Admitting: Internal Medicine

## 2018-07-05 VITALS — BP 132/78 | HR 74 | Temp 97.8°F | Resp 16 | Ht <= 58 in | Wt 128.0 lb

## 2018-07-05 DIAGNOSIS — H9190 Unspecified hearing loss, unspecified ear: Secondary | ICD-10-CM | POA: Diagnosis not present

## 2018-07-05 DIAGNOSIS — Z23 Encounter for immunization: Secondary | ICD-10-CM

## 2018-07-05 DIAGNOSIS — E785 Hyperlipidemia, unspecified: Secondary | ICD-10-CM

## 2018-07-05 DIAGNOSIS — E1169 Type 2 diabetes mellitus with other specified complication: Secondary | ICD-10-CM | POA: Diagnosis not present

## 2018-07-05 MED ORDER — ZOSTER VAC RECOMB ADJUVANTED 50 MCG/0.5ML IM SUSR: 0.5000 mL | Freq: Once | INTRAMUSCULAR | 1 refills | Status: AC

## 2018-07-05 NOTE — Patient Instructions (Signed)
GO TO THE LAB : Get the blood work     GO TO THE FRONT DESK Schedule labs to be done next week, A1c, BMP Schedule your next appointment for a routine checkup 4 to 6 months from today   Check the  blood pressure weekly  Be sure your blood pressure is between 110/65 and  135/85. If it is consistently higher or lower, let me know

## 2018-07-05 NOTE — Progress Notes (Signed)
Subjective:    Patient ID: Alison Graves, female    DOB: 1931/10/04, 85 y.o.   MRN: [id number]  DOS:  07/05/2018 Type of visit - description: rov, here with her daughter  In general feels well 3 weeks ago, she lost her balance, had a fall, injury to her left arm and forehead.  No LOC, no syncope.  Simply a loss of balance.  No major injuries, reports no neck, back or hip pain at this point. Myalgias: Still there but improved from baseline. HTN: Reports normal ambulatory BPs. Referral to audiologist still  needed.   Review of Systems Although she feels well, seems like she has a slow down, not doing much of ADLs, her daughters take care of that.  She falls asleep easily , even sometimes while eating.  No snoring.  Past Medical History:  Diagnosis Date  . Hemorrhoids   . HTN (hypertension)   . Hyperlipemia   . Microhematuria    s/p eval DR Tannenbaum 2004  . Osteoarthritis   . PMR (polymyalgia rheumatica) (HCC)    ?    Past Surgical History:  Procedure Laterality Date  . BREAST BIOPSY  2005   benign  . CATARACT EXTRACTION    . LAPAROSCOPIC HYSTERECTOMY     per Dr Toney Rakes, ?oophorectomy    Social History   Socioeconomic History  . Marital status: Widowed    Spouse name: Not on file  . Number of children: 3  . Years of education: Not on file  . Highest education level: Not on file  Occupational History  . Occupation: retired   Scientific laboratory technician  . Financial resource strain: Not on file  . Food insecurity:    Worry: Not on file    Inability: Not on file  . Transportation needs:    Medical: Not on file    Non-medical: Not on file  Tobacco Use  . Smoking status: Never Smoker  . Smokeless tobacco: Never Used  Substance and Sexual Activity  . Alcohol use: No  . Drug use: No  . Sexual activity: Not Currently    Birth control/protection: Surgical  Lifestyle  . Physical activity:    Days per week: Not on file    Minutes per session: Not on file  . Stress: Not on  file  Relationships  . Social connections:    Talks on phone: Not on file    Gets together: Not on file    Attends religious service: Not on file    Active member of club or organization: Not on file    Attends meetings of clubs or organizations: Not on file    Relationship status: Not on file  . Intimate partner violence:    Fear of current or ex partner: Not on file    Emotionally abused: Not on file    Physically abused: Not on file    Forced sexual activity: Not on file  Other Topics Concern  . Not on file  Social History Narrative   Widow,  Original from [place] , lives w/ 2 of her 3 daughters      Allergies as of 07/05/2018   No Known Allergies     Medication List       Accurate as of July 05, 2018 11:59 PM. Always use your most recent med list.        acetaminophen 650 MG CR tablet Commonly known as:  TYLENOL Take 650 mg by mouth every 8 (eight) hours as needed. Reported on  01/18/2016   aspirin 81 MG tablet Take 81 mg by mouth daily.   Blood Pressure Monitor Automat Devi Check blood pressure daily as directed   CALCIUM 600 + D PO Take 3 tablets by mouth daily.   carvedilol 25 MG tablet Commonly known as:  COREG Take 1 tablet (25 mg total) by mouth 2 (two) times daily with a meal.   cloNIDine 0.3 MG tablet Commonly known as:  CATAPRES Take 1 tablet (0.3 mg total) by mouth 3 (three) times daily.   fish oil-omega-3 fatty acids 1000 MG capsule Take 4 g by mouth daily. Reported on 01/18/2016   glucose blood test strip Commonly known as:  ONETOUCH VERIO Check blood sugar once daily   hydrochlorothiazide 25 MG tablet Commonly known as:  HYDRODIURIL Take 1 tablet (25 mg total) by mouth daily.   losartan 100 MG tablet Commonly known as:  COZAAR Take 1 tablet (100 mg total) by mouth daily.   metFORMIN 1000 MG tablet Commonly known as:  GLUCOPHAGE Take 1 tablet (1,000 mg total) by mouth 2 (two) times daily with a meal.   MULTIVITAMIN PO Take 1  tablet by mouth daily.   ONE TOUCH LANCETS Misc Check blood sugar once daily   ONETOUCH VERIO FLEX SYSTEM w/Device Kit Check blood sugar once daily   Vitamin D3 125 MCG (5000 UT) Tabs Take 4 tablets by mouth daily.   Zoster Vaccine Adjuvanted injection Commonly known as:  SHINGRIX Inject 0.5 mLs into the muscle once for 1 dose.           Objective:   Physical Exam BP 132/78 (BP Location: Left Arm, Patient Position: Sitting, Cuff Size: Normal)   Pulse 74   Temp 97.8 F (36.6 C) (Oral)   Resp 16   Ht 4' 10"  (1.473 m)   Wt 128 lb (58.1 kg)   SpO2 97%   BMI 26.75 kg/m  General:   Well developed, NAD, BMI noted. HEENT:  Normocephalic . Face symmetric, atraumatic Lungs:  CTA B Normal respiratory effort, no intercostal retractions, no accessory muscle use. Heart: RRR,  no murmur.  No pretibial edema bilaterally  Skin: Not pale. Not jaundice Neurologic:  alert & oriented X3.  Speech normal, gait appropriate for age and unassisted Psych--  Cognition and judgment appear intact.  Cooperative with normal attention span and concentration.  Behavior appropriate. No anxious or depressed appearing.      Assessment     Assessment DM HTN Hyperlipidemia LE edema  Microhematuria, urology eval 2004 Osteopenia-- t score -1.1 2011, t score normal 10-2015 DJD - knees H/o PMR  Years ago, saw rheumatology (Dr Rockwell [place]) , dx PMR with normal sed rate- CRP but elevated CK. Responded to steroids  PLAN: DM: Currently on metformin, check A1c. HTN: Seems controlled, continue carvedilol, clonidine, losartan.  Check a BMP Hyperlipidemia: Holding Lipitor since 02/06/2018 due to myalgias.  Myalgias are improved compared to baseline.  Reassess FLP at some point but I am not sure trying other statins is in her best interest.  Zetia?. HOH, failed previous referral, will try with another audiology center: [facility]: [facility] today, Shingrix prescription printed. RTC 4 to 6 months

## 2018-07-05 NOTE — Progress Notes (Signed)
Pre visit review using our clinic review tool, if applicable. No additional management support is needed unless otherwise documented below in the visit note. 

## 2018-07-07 ENCOUNTER — Other Ambulatory Visit: Payer: Self-pay | Admitting: Internal Medicine

## 2018-07-07 NOTE — Assessment & Plan Note (Signed)
DM: Currently on metformin, check A1c. HTN: Seems controlled, continue carvedilol, clonidine, losartan.  Check a BMP Hyperlipidemia: Holding Lipitor since 02/06/2018 due to myalgias.  Myalgias are improved compared to baseline.  Reassess FLP at some point but I am not sure trying other statins is in her best interest.  Zetia?. HOH, failed previous referral, will try with another audiology center: [facility]: [facility] today, Shingrix prescription printed. RTC 4 to 6 months

## 2018-07-08 ENCOUNTER — Other Ambulatory Visit: Payer: Medicare Other

## 2018-07-08 ENCOUNTER — Other Ambulatory Visit: Payer: Self-pay | Admitting: Internal Medicine

## 2018-07-09 ENCOUNTER — Other Ambulatory Visit (INDEPENDENT_AMBULATORY_CARE_PROVIDER_SITE_OTHER): Payer: Medicare Other

## 2018-07-09 DIAGNOSIS — E1169 Type 2 diabetes mellitus with other specified complication: Secondary | ICD-10-CM | POA: Diagnosis not present

## 2018-07-09 MED FILL — SHINGRIX 50 MCG SUS: 50 | 1 days supply | Qty: 1 | Fill #0

## 2018-07-10 ENCOUNTER — Encounter: Payer: Self-pay | Admitting: Internal Medicine

## 2018-07-10 ENCOUNTER — Other Ambulatory Visit: Payer: Medicare Other

## 2018-07-10 DIAGNOSIS — E1169 Type 2 diabetes mellitus with other specified complication: Secondary | ICD-10-CM

## 2018-07-10 LAB — BASIC METABOLIC PANEL
BUN: 21 mg/dL (ref 6–23)
CHLORIDE: 98 meq/L (ref 96–112)
CO2: 31 mEq/L (ref 19–32)
Calcium: 10.7 mg/dL — ABNORMAL HIGH (ref 8.4–10.5)
Creatinine, Ser: 0.8 mg/dL (ref 0.40–1.20)
GFR: 72.51 mL/min (ref >60.00)
Glucose, Bld: 176 mg/dL — ABNORMAL HIGH (ref 70–99)
Potassium: 3.8 mEq/L (ref 3.5–5.1)
Sodium: 138 mEq/L (ref 135–145)

## 2018-07-11 LAB — HEMOGLOBIN A1C
Hgb A1c MFr Bld: 7.2 % of total Hgb — ABNORMAL HIGH (ref <5.7)
Mean Plasma Glucose: 160 (calc)
eAG (mmol/L): 8.9 (calc)

## 2018-07-12 ENCOUNTER — Telehealth: Payer: Self-pay | Admitting: Internal Medicine

## 2018-07-12 NOTE — Telephone Encounter (Signed)
Copied from CRM [id number]. Topic: General - Other >> Jul 12, 2018  4:53 PM Tamela Oddi wrote: Reason for CRM:  Patient's daughter is returning a call on her mother's behalf.  She stated that her mother said someone called and her mother does no speak good english.  Please call daughter back so she can relay the message to the patient.  CB# [phone]

## 2018-07-15 NOTE — Telephone Encounter (Signed)
I don't see where our office called.

## 2018-07-31 ENCOUNTER — Other Ambulatory Visit: Payer: Self-pay

## 2018-07-31 MED ORDER — LOSARTAN POTASSIUM 100 MG PO TABS: 100.0000 mg | ORAL_TABLET | Freq: Every day | ORAL | 1 refills | Status: DC

## 2018-10-03 ENCOUNTER — Other Ambulatory Visit: Payer: Self-pay | Admitting: Internal Medicine

## 2018-11-03 ENCOUNTER — Other Ambulatory Visit: Payer: Self-pay | Admitting: Internal Medicine

## 2018-12-10 ENCOUNTER — Other Ambulatory Visit: Payer: Self-pay | Admitting: Internal Medicine

## 2018-12-20 ENCOUNTER — Other Ambulatory Visit: Payer: Self-pay

## 2018-12-20 ENCOUNTER — Ambulatory Visit (INDEPENDENT_AMBULATORY_CARE_PROVIDER_SITE_OTHER): Payer: Medicare Other | Admitting: Internal Medicine

## 2018-12-20 DIAGNOSIS — E1169 Type 2 diabetes mellitus with other specified complication: Secondary | ICD-10-CM | POA: Diagnosis not present

## 2018-12-20 DIAGNOSIS — L603 Nail dystrophy: Secondary | ICD-10-CM | POA: Diagnosis not present

## 2018-12-20 DIAGNOSIS — E785 Hyperlipidemia, unspecified: Secondary | ICD-10-CM

## 2018-12-20 DIAGNOSIS — I10 Essential (primary) hypertension: Secondary | ICD-10-CM

## 2018-12-20 DIAGNOSIS — N9089 Other specified noninflammatory disorders of vulva and perineum: Secondary | ICD-10-CM

## 2018-12-20 NOTE — Progress Notes (Signed)
Subjective:    Patient ID: Alison Graves, female    DOB: [birth year], 85 y.o.   MRN: [id number]  DOS:  12/20/2018 Type of visit - description:  Virtual Visit via Video Note  I connected with@ on 12/22/18 at  8:40 AM EDT by a video enabled telemedicine application and verified that I am speaking with the correct person using two identifiers.   THIS ENCOUNTER IS A VIRTUAL VISIT DUE TO COVID-19 - PATIENT WAS NOT SEEN IN THE OFFICE. PATIENT HAS CONSENTED TO VIRTUAL VISIT / TELEMEDICINE VISIT   Location of patient: home  Location of provider: office  I discussed the limitations of evaluation and management by telemedicine and the availability of in person appointments. The patient expressed understanding and agreed to proceed.  History of Present Illness: Routine office visit, she is assisted today by her daughter. In general feeling well, she does have some concerns For the last 4 months, her vulva has been swelling on and off.  Denies itching, pain or rash.  Has seen a small amount of brown vaginal discharge from time to time.  No frank bleeding. Nails thickened dystrophic, request a podiatry referral DM: Good medication compliance, no ambulatory CBGs. HTN: Good compliance with medications, ambulatory BPs when checked are very good, the last number she remember is a systolic BP of 409.   Review of Systems  See review of systems above Also denies fever chills No chest pain no difficulty breathing No nausea, vomiting, diarrhea No cough No myalgias, occasionally has a leg cramp.  Past Medical History:  Diagnosis Date  . Hemorrhoids   . HTN (hypertension)   . Hyperlipemia   . Microhematuria    s/p eval DR Tannenbaum 2004  . Osteoarthritis   . PMR (polymyalgia rheumatica) (HCC)    ?    Past Surgical History:  Procedure Laterality Date  . BREAST BIOPSY  2005   benign  . CATARACT EXTRACTION    . LAPAROSCOPIC HYSTERECTOMY     per Dr Toney Rakes, ?oophorectomy    Social  History   Socioeconomic History  . Marital status: Widowed    Spouse name: Not on file  . Number of children: 3  . Years of education: Not on file  . Highest education level: Not on file  Occupational History  . Occupation: retired   Scientific laboratory technician  . Financial resource strain: Not on file  . Food insecurity    Worry: Not on file    Inability: Not on file  . Transportation needs    Medical: Not on file    Non-medical: Not on file  Tobacco Use  . Smoking status: Never Smoker  . Smokeless tobacco: Never Used  Substance and Sexual Activity  . Alcohol use: No  . Drug use: No  . Sexual activity: Not Currently    Birth control/protection: Surgical  Lifestyle  . Physical activity    Days per week: Not on file    Minutes per session: Not on file  . Stress: Not on file  Relationships  . Social Herbalist on phone: Not on file    Gets together: Not on file    Attends religious service: Not on file    Active member of club or organization: Not on file    Attends meetings of clubs or organizations: Not on file    Relationship status: Not on file  . Intimate partner violence    Fear of current or ex partner: Not on file  Emotionally abused: Not on file    Physically abused: Not on file    Forced sexual activity: Not on file  Other Topics Concern  . Not on file  Social History Narrative   Widow,  Original from [place] , lives w/ 2 of her 3 daughters      Allergies as of 12/20/2018   No Known Allergies     Medication List       Accurate as of December 20, 2018 11:59 PM. If you have any questions, ask your nurse or doctor.        acetaminophen 650 MG CR tablet Commonly known as: TYLENOL Take 650 mg by mouth every 8 (eight) hours as needed. Reported on 01/18/2016   aspirin 81 MG tablet Take 81 mg by mouth daily.   Blood Pressure Monitor Automat Devi Check blood pressure daily as directed   CALCIUM 600 + D PO Take 3 tablets by mouth daily.   carvedilol  25 MG tablet Commonly known as: COREG Take 1 tablet (25 mg total) by mouth 2 (two) times daily with a meal.   cloNIDine 0.3 MG tablet Commonly known as: CATAPRES Take 1 tablet (0.3 mg total) by mouth 3 (three) times daily.   fish oil-omega-3 fatty acids 1000 MG capsule Take 4 g by mouth daily. Reported on 01/18/2016   glucose blood test strip Commonly known as: OneTouch Verio Check blood sugar once daily   hydrochlorothiazide 25 MG tablet Commonly known as: HYDRODIURIL Take 1 tablet (25 mg total) by mouth daily.   losartan 100 MG tablet Commonly known as: COZAAR Take 1 tablet (100 mg total) by mouth daily.   metFORMIN 1000 MG tablet Commonly known as: GLUCOPHAGE Take 1 tablet (1,000 mg total) by mouth 2 (two) times daily with a meal.   MULTIVITAMIN PO Take 1 tablet by mouth daily.   ONE TOUCH LANCETS Misc Check blood sugar once daily   OneTouch Verio Flex System w/Device Kit Check blood sugar once daily   Vitamin D3 125 MCG (5000 UT) Tabs Take 4 tablets by mouth daily.           Objective:   Physical Exam There were no vitals taken for this visit. This is a virtual video visit, she is alert oriented x3, no apparent distress, very lucid, speech is clear.    Assessment      Assessment DM HTN Hyperlipidemia LE edema  Microhematuria, urology eval 2004 Osteopenia-- t score -1.1 2011, t score normal 10-2015 DJD - knees H/o PMR  Years ago, saw rheumatology (Dr Rockwell [place]) , dx PMR with normal sed rate- CRP but elevated CK. Responded to steroids  PLAN: DM: Currently on metformin, check A1c HTN: Currently on carvedilol, clonidine, HCTZ, losartan.  Reports good ambulatory BPs, check a BMP and CBC. Increased calcium: Last calcium was slightly elevated, rechecking today. Hyperlipidemia: Currently off Lipitor, we do not plan to go back, it was causing some side effects.  Will check a FLP.  Zetia?. Vulvar swelling: Request a referral to gynecology.  Will do Dystrophic  nail, has a hard time taking care of them, refer to podiatry. RTC 4 months CPX, will arrange    I discussed the assessment and treatment plan with the patient. The patient was provided an opportunity to ask questions and all were answered. The patient agreed with the plan and demonstrated an understanding of the instructions.   The patient was advised to call back or seek an in-person evaluation if the symptoms worsen or if  the condition fails to improve as anticipated.

## 2018-12-22 NOTE — Assessment & Plan Note (Signed)
DM: Currently on metformin, check A1c HTN: Currently on carvedilol, clonidine, HCTZ, losartan.  Reports good ambulatory BPs, check a BMP and CBC. Increased calcium: Last calcium was slightly elevated, rechecking today. Hyperlipidemia: Currently off Lipitor, we do not plan to go back, it was causing some side effects.  Will check a FLP.  Zetia?. Vulvar swelling: Request a referral to gynecology.  Will do Dystrophic nail, has a hard time taking care of them, refer to podiatry. RTC 4 months CPX, will arrange

## 2018-12-27 DIAGNOSIS — N952 Postmenopausal atrophic vaginitis: Secondary | ICD-10-CM | POA: Diagnosis not present

## 2019-01-09 ENCOUNTER — Telehealth: Payer: Self-pay | Admitting: *Deleted

## 2019-01-09 NOTE — Telephone Encounter (Signed)
Doubt that is covid related, okay to proceed. As long as she does no have fever, chills, cough, etc. is okay

## 2019-01-09 NOTE — Telephone Encounter (Signed)
Ok, thank you. We will proceed with the lab appt as scheduled.

## 2019-01-09 NOTE — Telephone Encounter (Signed)
Spoke with pt's daughter for COVID screen prior to lab appt tomorrow. Pt answered no to all questions except she has noticed some loss of taste / smell since December. Please advise if ok to keep lab appt as scheduled tomorrow or if there are any other recommendations regarding pt's symptom.

## 2019-01-10 ENCOUNTER — Other Ambulatory Visit: Payer: Self-pay

## 2019-01-10 ENCOUNTER — Other Ambulatory Visit (INDEPENDENT_AMBULATORY_CARE_PROVIDER_SITE_OTHER): Payer: Medicare Other

## 2019-01-10 DIAGNOSIS — E785 Hyperlipidemia, unspecified: Secondary | ICD-10-CM

## 2019-01-10 DIAGNOSIS — I10 Essential (primary) hypertension: Secondary | ICD-10-CM

## 2019-01-10 DIAGNOSIS — E1169 Type 2 diabetes mellitus with other specified complication: Secondary | ICD-10-CM

## 2019-01-10 LAB — CBC WITH DIFFERENTIAL/PLATELET
Basophils Absolute: 0.1 10*3/uL (ref 0.0–0.1)
Basophils Relative: 0.9 % (ref 0.0–3.0)
Eosinophils Absolute: 0.2 10*3/uL (ref 0.0–0.7)
Eosinophils Relative: 3.7 % (ref 0.0–5.0)
HCT: 38.9 % (ref 36.0–46.0)
Hemoglobin: 12.7 g/dL (ref 12.0–15.0)
Lymphocytes Relative: 32.8 % (ref 12.0–46.0)
Lymphs Abs: 2.1 10*3/uL (ref 0.7–4.0)
MCHC: 32.8 g/dL (ref 30.0–36.0)
MCV: 92.8 fl (ref 78.0–100.0)
Monocytes Absolute: 0.5 10*3/uL (ref 0.1–1.0)
Monocytes Relative: 8 % (ref 3.0–12.0)
Neutro Abs: 3.4 10*3/uL (ref 1.4–7.7)
Neutrophils Relative %: 54.6 % (ref 43.0–77.0)
Platelets: 274 10*3/uL (ref 150.0–400.0)
RBC: 4.19 Mil/uL (ref 3.87–5.11)
RDW: 13.5 % (ref 11.5–15.5)
WBC: 6.3 10*3/uL (ref 4.0–10.5)

## 2019-01-10 LAB — BASIC METABOLIC PANEL
BUN: 24 mg/dL — ABNORMAL HIGH (ref 6–23)
CO2: 30 mEq/L (ref 19–32)
Calcium: 9.5 mg/dL (ref 8.4–10.5)
Chloride: 102 mEq/L (ref 96–112)
Creatinine, Ser: 0.81 mg/dL (ref 0.40–1.20)
GFR: 67.17 mL/min (ref >60.00)
Glucose, Bld: 133 mg/dL — ABNORMAL HIGH (ref 70–99)
Potassium: 4.3 mEq/L (ref 3.5–5.1)
Sodium: 141 mEq/L (ref 135–145)

## 2019-01-10 LAB — LIPID PANEL
Cholesterol: 211 mg/dL — ABNORMAL HIGH (ref 0–200)
HDL: 57 mg/dL (ref >39.00)
LDL Cholesterol: 129 mg/dL — ABNORMAL HIGH (ref 0–99)
NonHDL: 154.24
Total CHOL/HDL Ratio: 4
Triglycerides: 125 mg/dL (ref 0.0–149.0)
VLDL: 25 mg/dL (ref 0.0–40.0)

## 2019-01-13 DIAGNOSIS — R339 Retention of urine, unspecified: Secondary | ICD-10-CM | POA: Diagnosis not present

## 2019-01-17 MED ORDER — EZETIMIBE 10 MG PO TABS: 10.0000 mg | ORAL_TABLET | Freq: Every day | ORAL | 6 refills | Status: AC

## 2019-01-17 NOTE — Addendum Note (Signed)
Addended byDamita Dunnings D on: 01/17/2019 10:12 AM   Modules accepted: Orders

## 2019-01-20 ENCOUNTER — Other Ambulatory Visit: Payer: Self-pay | Admitting: Internal Medicine

## 2019-01-24 ENCOUNTER — Ambulatory Visit (INDEPENDENT_AMBULATORY_CARE_PROVIDER_SITE_OTHER): Payer: Medicare Other | Admitting: Podiatry

## 2019-01-24 ENCOUNTER — Other Ambulatory Visit: Payer: Self-pay

## 2019-01-24 ENCOUNTER — Encounter: Payer: Self-pay | Admitting: Podiatry

## 2019-01-24 VITALS — BP 163/97 | HR 87

## 2019-01-24 DIAGNOSIS — B351 Tinea unguium: Secondary | ICD-10-CM

## 2019-01-24 DIAGNOSIS — M79674 Pain in right toe(s): Secondary | ICD-10-CM | POA: Diagnosis not present

## 2019-01-24 DIAGNOSIS — M79675 Pain in left toe(s): Secondary | ICD-10-CM

## 2019-01-24 DIAGNOSIS — M2012 Hallux valgus (acquired), left foot: Secondary | ICD-10-CM

## 2019-01-24 DIAGNOSIS — M2041 Other hammer toe(s) (acquired), right foot: Secondary | ICD-10-CM | POA: Diagnosis not present

## 2019-01-24 DIAGNOSIS — M2011 Hallux valgus (acquired), right foot: Secondary | ICD-10-CM

## 2019-01-24 DIAGNOSIS — E119 Type 2 diabetes mellitus without complications: Secondary | ICD-10-CM

## 2019-01-24 DIAGNOSIS — M2042 Other hammer toe(s) (acquired), left foot: Secondary | ICD-10-CM

## 2019-01-24 NOTE — Patient Instructions (Addendum)
La diabetes mellitus y el cuidado de los pies Diabetes Mellitus and Foot Care El cuidado de los pies es un aspecto importante de la salud, especialmente si tiene diabetes. La diabetes puede generar problemas debido a que el flujo sanguneo (circulacin) es deficiente en las piernas y los pies, y esto puede hacer que la piel:  Se torne ms fina y seca.  Se resquebraje ms fcilmente.  Cicatrice ms lentamente.  Se descame y agriete. Tambin pueden estar daados los nervios (neuropata) de las piernas y de los pies, lo que provoca una disminucin de la sensibilidad. En consecuencia, es posible que no advierta heridas pequeas en los pies que pueden causar problemas ms graves. Identificar y tratar cualquier complicacin lo antes posible es la mejor manera de evitar futuros problemas de pie. Cmo cuidar los pies Higiene de los pies  Lvese los pies todos los das con agua tibia y un jabn suave. No use agua caliente. Luego squese los pies y entre los dedos dando palmaditas, hasta que estn completamente secos. No remoje los pies, ya que esto puede resecar la piel.  Crtese las uas de los pies en lnea recta. No escarbe debajo de las uas o alrededor de las cutculas. Lime los bordes de las uas con una lima o esmeril.  Aplique una locin hidratante o vaselina en la piel de los pies y en las uas secas y quebradizas. Use una locin que no contenga alcohol ni fragancias. No aplique locin entre los dedos. Zapatos y calcetines  Use calcetines de algodn o medias limpias todos los das. Asegrese de que no le ajusten demasiado. No use calcetines que le lleguen a las rodillas, ya que podran disminuir el flujo de sangre a las piernas.  Use zapatos de cuero que le queden bien y que sean acolchados. Revise siempre los zapatos antes de ponerlos para asegurarse de que no haya objetos en su interior.  Para amoldar los zapatos, clcelos solo algunas horas por da. Esto evitar lesiones en los pies.  Heridas, rasguos, durezas y callosidades  Controle sus pies diariamente para observar si hay ampollas, cortes, moretones, llagas o enrojecimiento. Si no puede ver la planta del pie, use un espejo o pdale ayuda a otra persona.  No corte las durezas o callosidades, ni trate de quitarlas con medicamentos.  Si algo le ha raspado, cortado o lastimado la piel de los pies, mantenga la piel de esa zona limpia y seca. Puede higienizar estas zonas con agua y un jabn suave. No limpie la zona con agua oxigenada, alcohol ni yodo.  Si tiene una herida, un rasguo, una dureza o una callosidad en el pie, revsela varias veces al da para asegurarse de que se est curando y no se infecte. Est atento a los siguientes signos: ? Dolor, hinchazn o enrojecimiento. ? Lquido o sangre. ? Calor. ? Pus o mal olor. Instrucciones generales  No se cruce de piernas. Esto puede disminuir el flujo de sangre a los pies.  No use bolsas de agua caliente ni almohadillas trmicas en los pies. Podran causar quemaduras. Si ha perdido la sensibilidad en los pies o las piernas, no sabr lo que le est sucediendo hasta que sea demasiado tarde.  Proteja sus pies del calor y del fro con calzado, en la playa o sobre el pavimento caliente.  Programe una cita para un examen completo de los pies por lo menos una vez al ao (anualmente) o con ms frecuencia si tiene problemas en los pies. Si tiene problemas en los   pies, infrmele al mdico de inmediato sobre los cortes, las llagas o los moretones. Comunquese con un mdico si:  Tiene una afeccin que aumenta su riesgo de tener infecciones y tiene cortes, llagas o moretones en los pies.  Tiene una lesin que no se cura.  Tiene una zona irritada en las piernas o los pies.  Siente una sensacin de ardor u hormigueo en las piernas o los pies.  Siente dolor o calambres en las piernas o los pies.  Las piernas o los pies estn adormecidos.  Siente los pies siempre fros.   Siente dolor alrededor de una ua del pie. Solicite ayuda de inmediato si:  Tiene una herida, un rasguo, una dureza o una callosidad en el pie y: ? Tiene dolor, hinchazn o enrojecimiento que empeora. ? Le sale lquido o sangre de la herida, el rasguo, la dureza o la callosidad. ? La herida, el rasguo, la dureza o la callosidad est caliente al tacto. ? Le sale pus o mal olor de la herida, el rasguo, la dureza o la callosidad. ? Tiene fiebre. ? Tiene una lnea roja que sube por la pierna. Resumen  Controle todos los das el estado de sus pies para observar si hay cortes, llagas, manchas rojas, hinchazn o ampollas.  Humctese los pies y las piernas a diario.  Use zapatos de cuero que le queden bien y que sean acolchados.  Si tiene problemas en los pies, infrmele al mdico de inmediato sobre los cortes, las llagas o los moretones.  Programe una cita para un examen completo de los pies por lo menos una vez al ao (anualmente) o con ms frecuencia si tiene problemas en los pies. Esta informacin no tiene como fin reemplazar el consejo del mdico. Asegrese de hacerle al mdico cualquier pregunta que tenga. Document Released: 06/19/2005 Document Revised: 02/09/2017 Document Reviewed: 02/09/2017 Elsevier Patient Education  2020 [employer] Inc[employer]  

## 2019-01-26 NOTE — Progress Notes (Signed)
Subjective: Alison Graves presents today referred by Colon Branch, MD for diabetic foot evaluation.  She is Spanish speaking and is accompanied by her daughter and a [place] interpreter.   Patient relates 4 year year history of diabetes.  Patient denies any history of foot wounds.  Patient denies any history of numbness, tingling, burning, pins/needles sensations.  Today, patient c/o of painful, discolored, thick toenails which interfere with daily activities.  Pain is aggravated when wearing enclosed shoe gear.   Past Medical History:  Diagnosis Date  . Hemorrhoids   . HTN (hypertension)   . Hyperlipemia   . Microhematuria    s/p eval DR Tannenbaum 2004  . Osteoarthritis   . PMR (polymyalgia rheumatica) (HCC)    ?    Patient Active Problem List   Diagnosis Date Noted  . Diabetes mellitus, type II ([place]) 01/25/2017  . Chondrocalcinosis 04/24/2016  . Osteoarthritis of patellofemoral joints of both knees 04/24/2016  . Osteoarthritis of both hands 04/24/2016  . Osteoarthritis of feet, bilateral 04/24/2016  . PCP NOTES >>>>>>>>>>>>>>>>>>>>>>>>> 09/26/2015  . Edema 02/17/2015  . Anxiety 11/16/2011  . Annual physical exam 10/04/2010  . Osteopenia 10/04/2010  . MENOPAUSE-RELATED VASOMOTOR SYMPTOMS 04/19/2009  . Osteoarthritis of both knees 12/16/2008  . Polymyalgia rheumatica ([place]) 01/10/2008  . Dyslipidemia 11/29/2006  . HTN (hypertension) 11/20/2006  . HEMORRHOIDS 07/25/2006    Past Surgical History:  Procedure Laterality Date  . BREAST BIOPSY  2005   benign  . CATARACT EXTRACTION    . LAPAROSCOPIC HYSTERECTOMY     per Dr Toney Rakes, ?oophorectomy   Medications reviewed.  No Known Allergies  Social History   Occupational History  . Occupation: retired   Tobacco Use  . Smoking status: Never Smoker  . Smokeless tobacco: Never Used  Substance and Sexual Activity  . Alcohol use: No  . Drug use: No  . Sexual activity: Not Currently    Birth  control/protection: Surgical    Family History  Problem Relation Age of Onset  . Coronary artery disease Mother        early in life   . Diabetes Other        grandmother  . Stroke Neg Hx   . Hypertension Neg Hx   . Cancer Neg Hx     Immunization History  Administered Date(s) Administered  . H1N1 07/29/2008  . Influenza Whole 06/13/2007, 04/24/2008, 04/19/2009, 04/06/2010  . Influenza, High Dose Seasonal PF 05/24/2016, 06/29/2017, 05/29/2018  . Influenza, Seasonal, Injecte, Preservative Fre 05/02/2013  . Influenza,inj,Quad PF,6+ Mos 06/11/2014  . Pneumococcal Conjugate-13 11/05/2013  . Pneumococcal Polysaccharide-23 07/29/2008, 07/05/2018  . Td 08/17/2009  . Zoster 09/25/2008  . Zoster Recombinat (Shingrix) 07/09/2018    Review of systems: Positive Findings in bold print.  Constitutional:  chills, fatigue, fever, sweats, weight change Communication: Optometrist, sign Ecologist, hand writing, iPad/Android device Head: headaches, head injury Eyes: changes in vision, eye pain, glaucoma, cataracts, macular degeneration, diplopia, glare,  light sensitivity, eyeglasses or contacts, blindness Ears nose mouth throat: hearing impaired, hearing aids,  ringing in ears, deaf, sign language,  vertigo, nosebleeds,  rhinitis,  cold sores, snoring, swollen glands Cardiovascular: HTN, edema, arrhythmia, pacemaker in place, defibrillator in place, chest pain/tightness, chronic anticoagulation, blood clot, heart failure, MI Peripheral Vascular: leg cramps, varicose veins, blood clots, lymphedema, varicosities Respiratory:  difficulty breathing, denies congestion, SOB, wheezing, cough, emphysema Gastrointestinal: change in appetite or weight, abdominal pain, constipation, diarrhea, nausea, vomiting, vomiting blood, change in bowel habits, abdominal pain, jaundice, rectal  bleeding, hemorrhoids, GERD Genitourinary:  nocturia,  pain on urination, polyuria,  blood in urine, Foley catheter,  urinary urgency, ESRD on hemodialysis Musculoskeletal: amputation, cramping, stiff joints, painful joints, decreased joint motion, fractures, OA, gout, hemiplegia, paraplegia, uses cane, wheelchair bound, uses walker, uses rollator Skin: +changes in toenails, color change, dryness, itching, mole changes,  rash, wound(s) Neurological: headaches, numbness in feet, paresthesias in feet, burning in feet, fainting,  seizures, change in speech,  headaches, memory problems/poor historian, cerebral palsy, weakness, paralysis, CVA, TIA Endocrine: diabetes, hypothyroidism, hyperthyroidism,  goiter, dry mouth, flushing, heat intolerance,  cold intolerance,  excessive thirst, denies polyuria,  nocturia Hematological:  easy bleeding, excessive bleeding, easy bruising, enlarged lymph nodes, on long term blood thinner, history of past transusions Allergy/immunological:  hives, eczema, frequent infections, multiple drug allergies, seasonal allergies, transplant recipient, multiple food allergies Psychiatric:  anxiety, depression, mood disorder, suicidal ideations, hallucinations, insomnia  Objective: Vitals:   01/24/19 1327  BP: (!) 163/97  Pulse: 87   Vascular Examination: Capillary refill time <3 seconds x 10 digits.  Dorsalis pedis pulses palpable b/l.  Posterior tibial pulses palpable b/l.  Digital hair sparse x 10 digits.  Skin temperature gradient WNL b/l.  Varicosities b/l LE.  Dermatological Examination: Skin with normal turgor, texture and tone b/l.  Toenails 1-5 b/l discolored, thick, dystrophic with subungual debris and pain with palpation to nailbeds due to thickness of nails.  Musculoskeletal: Muscle strength 5/5 to all LE muscle groups.  Juxtamalleolar lipomas b/l lateral ankles.  HAV with bunion b/l.  Hammertoe 2nd b/l.  Neurological: Sensation intact with 10 gram monofilament.  Vibratory sensation intact.  Assessment: 1. Painful onychomycosis toenails 1-5 b/l   2. NIDDM 3. HAV with bunions b/l 4. Hammertoes 2nd b/l  Plan: 1. Discussed diabetic foot care principles. Literature dispensed on today. 2. Toenails 1-5 b/l were debrided in length and girth without iatrogenic bleeding.Rx written for nonformulary compounding topical antifungal: [facility]: Antifungal cream - Terbinafine 3%, Fluconazole 2%, Tea Tree Oil 5%, Urea 10%, Ibuprofen 2% in DMSO Suspension #6830ml. Apply to the affected nail(s) at bedtime. 3. Patient to continue soft, supportive shoe gear 4. Patient to report any pedal injuries to medical professional immediately. 5. Follow up 3 months.  6. Patient/POA to call should there be a concern in the interim.

## 2019-02-07 ENCOUNTER — Ambulatory Visit: Payer: Medicare Other | Admitting: Orthotics

## 2019-02-07 ENCOUNTER — Other Ambulatory Visit: Payer: Self-pay

## 2019-02-07 DIAGNOSIS — M79674 Pain in right toe(s): Secondary | ICD-10-CM

## 2019-02-07 DIAGNOSIS — M2012 Hallux valgus (acquired), left foot: Secondary | ICD-10-CM

## 2019-02-07 DIAGNOSIS — M2041 Other hammer toe(s) (acquired), right foot: Secondary | ICD-10-CM

## 2019-02-07 DIAGNOSIS — B351 Tinea unguium: Secondary | ICD-10-CM

## 2019-02-07 DIAGNOSIS — M2011 Hallux valgus (acquired), right foot: Secondary | ICD-10-CM

## 2019-02-07 DIAGNOSIS — E119 Type 2 diabetes mellitus without complications: Secondary | ICD-10-CM

## 2019-02-12 NOTE — Progress Notes (Signed)

## 2019-02-21 ENCOUNTER — Ambulatory Visit (INDEPENDENT_AMBULATORY_CARE_PROVIDER_SITE_OTHER): Payer: Medicare Other | Admitting: Internal Medicine

## 2019-02-21 DIAGNOSIS — J029 Acute pharyngitis, unspecified: Secondary | ICD-10-CM

## 2019-02-21 NOTE — Progress Notes (Signed)
Subjective:    Patient ID: Bufford Lope, female    DOB: 10-23-1931, 85 y.o.   MRN: [id number]  DOS:  02/21/2019 Type of visit - description:  Attempted  to make this a video visit, due to technical difficulties from the patient side it was not possible  thus we proceeded with a Virtual Visit via Telephone    I connected with@   by telephone and verified that I am speaking with the correct person using two identifiers.  THIS ENCOUNTER IS A VIRTUAL VISIT DUE TO COVID-19 - PATIENT WAS NOT SEEN IN THE OFFICE. PATIENT HAS CONSENTED TO VIRTUAL VISIT / TELEMEDICINE VISIT   Location of patient: home  Location of provider: office  I discussed the limitations, risks, security and privacy concerns of performing an evaluation and management service by telephone and the availability of in person appointments. I also discussed with the patient that there may be a patient responsible charge related to this service. The patient expressed understanding and agreed to proceed.   History of Present Illness:   Acute Yesterday, the patient was about to eat, she put a unpilled banana on her mouth and started to walk, she almost fall and in the process the banana was pushed  into her throat and hurt the left side. Since then, she is having pain at the left side of the throat. The area of impact was where the tonsil would be located. Since then she is doing gargles with H2 O2 and salt water. Today, she still has pain but it has decreased.    Review of Systems  Denies fever chills No difficulty swallowing except for mild increase in pain Voice yesterday was raspy but is back to normal today No stridor When she sees her neck in the mirror it is symmetric.   Past Medical History:  Diagnosis Date  . Hemorrhoids   . HTN (hypertension)   . Hyperlipemia   . Microhematuria    s/p eval DR Tannenbaum 2004  . Osteoarthritis   . PMR (polymyalgia rheumatica) (HCC)    ?    Past Surgical History:   Procedure Laterality Date  . BREAST BIOPSY  2005   benign  . CATARACT EXTRACTION    . LAPAROSCOPIC HYSTERECTOMY     per Dr Toney Rakes, ?oophorectomy    Social History   Socioeconomic History  . Marital status: Widowed    Spouse name: Not on file  . Number of children: 3  . Years of education: Not on file  . Highest education level: Not on file  Occupational History  . Occupation: retired   Scientific laboratory technician  . Financial resource strain: Not on file  . Food insecurity    Worry: Not on file    Inability: Not on file  . Transportation needs    Medical: Not on file    Non-medical: Not on file  Tobacco Use  . Smoking status: Never Smoker  . Smokeless tobacco: Never Used  Substance and Sexual Activity  . Alcohol use: No  . Drug use: No  . Sexual activity: Not Currently    Birth control/protection: Surgical  Lifestyle  . Physical activity    Days per week: Not on file    Minutes per session: Not on file  . Stress: Not on file  Relationships  . Social Herbalist on phone: Not on file    Gets together: Not on file    Attends religious service: Not on file  Active member of club or organization: Not on file    Attends meetings of clubs or organizations: Not on file    Relationship status: Not on file  . Intimate partner violence    Fear of current or ex partner: Not on file    Emotionally abused: Not on file    Physically abused: Not on file    Forced sexual activity: Not on file  Other Topics Concern  . Not on file  Social History Narrative   Widow,  Original from [place] , lives w/ 2 of her 3 daughters      Allergies as of 02/21/2019   No Known Allergies     Medication List       Accurate as of February 21, 2019  3:32 PM. If you have any questions, ask your nurse or doctor.        acetaminophen 650 MG CR tablet Commonly known as: TYLENOL Take 650 mg by mouth every 8 (eight) hours as needed. Reported on 01/18/2016   aspirin 81 MG tablet Take 81  mg by mouth daily.   Blood Pressure Monitor Automat Devi Check blood pressure daily as directed   CALCIUM 600 + D PO Take 3 tablets by mouth daily.   carvedilol 25 MG tablet Commonly known as: COREG Take 1 tablet (25 mg total) by mouth 2 (two) times daily with a meal.   cloNIDine 0.3 MG tablet Commonly known as: CATAPRES Take 1 tablet (0.3 mg total) by mouth 3 (three) times daily.   estradiol 0.1 MG/GM vaginal cream Commonly known as: ESTRACE APPLY A SMALL AMOUNT (0.5 GRAM) 2 TIMES WEEKLY   ezetimibe 10 MG tablet Commonly known as: Zetia Take 1 tablet (10 mg total) by mouth daily.   fish oil-omega-3 fatty acids 1000 MG capsule Take 4 g by mouth daily. Reported on 01/18/2016   glucose blood test strip Commonly known as: OneTouch Verio Check blood sugar once daily   hydrochlorothiazide 25 MG tablet Commonly known as: HYDRODIURIL Take 1 tablet (25 mg total) by mouth daily.   losartan 100 MG tablet Commonly known as: COZAAR Take 1 tablet (100 mg total) by mouth daily.   metFORMIN 1000 MG tablet Commonly known as: GLUCOPHAGE Take 1 tablet (1,000 mg total) by mouth 2 (two) times daily with a meal.   MULTIVITAMIN PO Take 1 tablet by mouth daily.   NON FORMULARY Antifungal solution for toenails from [place], faxed 01/24/2019 [facility]   ONE TOUCH LANCETS Misc Check blood sugar once daily   OneTouch Verio Flex System w/Device Kit Check blood sugar once daily   Vitamin D3 125 MCG (5000 UT) Tabs Take 4 tablets by mouth daily.           Objective:   Physical Exam There were no vitals taken for this visit. This is a virtual phone visit, she sounds alert, oriented x3, voice is normal, not raspy, no stridor    Assessment    Assessment DM HTN Hyperlipidemia LE edema  Microhematuria, urology eval 2004 Osteopenia-- t score -1.1 2011, t score normal 10-2015 DJD - knees H/o PMR  Years ago, saw rheumatology (Dr Rockwell [place]) , dx PMR with normal sed rate-  CRP but elevated CK. Responded to steroids  PLAN: Sore throat, due to injury. See HPI, injury to the throat, does not seem to have a  major problem at this point given that she is breathing well, no stridor or obvious neck deformity. Recommend Tylenol, gargles with warm salt water, ER if  severe pain, unable to swallow saliva, swelling of the neck, stridor or raspy voice.  All of the above was explained in simple terms to the patient and her daughter Angela Nevin.  They both verbalized understanding.    I discussed the assessment and treatment plan with the patient. The patient was provided an opportunity to ask questions and all were answered. The patient agreed with the plan and demonstrated an understanding of the instructions.   The patient was advised to call back or seek an in-person evaluation if the symptoms worsen or if the condition fails to improve as anticipated.  I provided 16 minutes of non-face-to-face time during this encounter.  Kathlene November, MD

## 2019-02-21 NOTE — Progress Notes (Signed)
Virtual Visit via Video Note  I connected with patient on 02/24/19 at  3:15 PM EDT by audio enabled telemedicine application and verified that I am speaking with the correct person using two identifiers.   THIS ENCOUNTER IS A VIRTUAL VISIT DUE TO COVID-19 - PATIENT WAS NOT SEEN IN THE OFFICE. PATIENT HAS CONSENTED TO VIRTUAL VISIT / TELEMEDICINE VISIT   Location of patient: home  Location of provider: office  I discussed the limitations of evaluation and management by telemedicine and the availability of in person appointments. The patient expressed understanding and agreed to proceed.  Daughter Darcus Pester) helping to translate for her mother.   Subjective:   Alison Graves is a 85 y.o. female who presents for Medicare Annual (Subsequent) preventive examination.  Review of Systems:    Home Safety/Smoke Alarms: Feels safe in home. Smoke alarms in place.  Lives in 2 story home with 2 daughters. 2 dogs, 1 cat, and bird. Step over tub w/ bench.  Female:             Eye- Dr.Groat. Dtr states she make appt soon.      Objective:     Advanced Directives 02/24/2019 02/01/2018 06/28/2017 01/25/2017 01/31/2016  Does Patient Have a Medical Advance Directive? No Yes No Yes No  Type of Advance Directive - [place];Living will - [place];Living will -  Does patient want to make changes to medical advance directive? - No - Patient declined - - -  Copy of [place] in Chart? - No - copy requested - No - copy requested -  Would patient like information on creating a medical advance directive? No - Patient declined - - - -    Tobacco Social History   Tobacco Use  Smoking Status Never Smoker  Smokeless Tobacco Never Used     Counseling given: Not Answered   Clinical Intake: Pain : No/denies pain    Past Medical History:  Diagnosis Date   Hemorrhoids    HTN (hypertension)    Hyperlipemia    Microhematuria    s/p  eval DR Tannenbaum 2004   Osteoarthritis    PMR (polymyalgia rheumatica) ([facility])    ?   Past Surgical History:  Procedure Laterality Date   BREAST BIOPSY  2005   benign   CATARACT EXTRACTION     LAPAROSCOPIC HYSTERECTOMY     per Dr Toney Rakes, ?oophorectomy   Family History  Problem Relation Age of Onset   Coronary artery disease Mother        early in life    Diabetes Other        grandmother   Stroke Neg Hx    Hypertension Neg Hx    Cancer Neg Hx    Social History   Socioeconomic History   Marital status: Widowed    Spouse name: Not on file   Number of children: 3   Years of education: Not on file   Highest education level: Not on file  Occupational History   Occupation: retired   Scientist, product/process development strain: Not on file   Food insecurity    Worry: Not on file    Inability: Not on Lexicographer needs    Medical: Not on file    Non-medical: Not on file  Tobacco Use   Smoking status: Never Smoker   Smokeless tobacco: Never Used  Substance and Sexual Activity   Alcohol use: No   Drug  use: No   Sexual activity: Not Currently    Birth control/protection: Surgical  Lifestyle   Physical activity    Days per week: Not on file    Minutes per session: Not on file   Stress: Not on file  Relationships   Social connections    Talks on phone: Not on file    Gets together: Not on file    Attends religious service: Not on file    Active member of club or organization: Not on file    Attends meetings of clubs or organizations: Not on file    Relationship status: Not on file  Other Topics Concern   Not on file  Social History Narrative   Widow,  Original from [place] , lives w/ 2 of her 3 daughters    Outpatient Encounter Medications as of 02/24/2019  Medication Sig   acetaminophen (TYLENOL) 650 MG CR tablet Take 650 mg by mouth every 8 (eight) hours as needed. Reported on 01/18/2016   aspirin 81 MG tablet Take 81  mg by mouth daily.     Blood Glucose Monitoring Suppl (ONETOUCH VERIO FLEX SYSTEM) w/Device KIT Check blood sugar once daily   Blood Pressure Monitoring (BLOOD PRESSURE MONITOR AUTOMAT) DEVI Check blood pressure daily as directed   Calcium Carbonate-Vitamin D (CALCIUM 600 + D PO) Take 3 tablets by mouth daily.    carvedilol (COREG) 25 MG tablet Take 1 tablet (25 mg total) by mouth 2 (two) times daily with a meal.   Cholecalciferol (VITAMIN D3) 5000 units TABS Take 4 tablets by mouth daily.   cloNIDine (CATAPRES) 0.3 MG tablet Take 1 tablet (0.3 mg total) by mouth 3 (three) times daily.   estradiol (ESTRACE) 0.1 MG/GM vaginal cream APPLY A SMALL AMOUNT (0.5 GRAM) 2 TIMES WEEKLY   ezetimibe (ZETIA) 10 MG tablet Take 1 tablet (10 mg total) by mouth daily.   fish oil-omega-3 fatty acids 1000 MG capsule Take 4 g by mouth daily. Reported on 01/18/2016   glucose blood (ONETOUCH VERIO) test strip Check blood sugar once daily   hydrochlorothiazide (HYDRODIURIL) 25 MG tablet Take 1 tablet (25 mg total) by mouth daily.   losartan (COZAAR) 100 MG tablet Take 1 tablet (100 mg total) by mouth daily.   metFORMIN (GLUCOPHAGE) 1000 MG tablet Take 1 tablet (1,000 mg total) by mouth 2 (two) times daily with a meal.   Multiple Vitamin (MULTIVITAMIN PO) Take 1 tablet by mouth daily.    NON FORMULARY Antifungal solution for toenails from [place], faxed 01/24/2019 [facility]   ONE TOUCH LANCETS MISC Check blood sugar once daily   No facility-administered encounter medications on file as of 02/24/2019.     Activities of Daily Living In your present state of health, do you have any difficulty performing the following activities: 02/24/2019  Hearing? Y  Vision? N  Difficulty concentrating or making decisions? N  Walking or climbing stairs? N  Dressing or bathing? N  Doing errands, shopping? N  Preparing Food and eating ? N  Using the Toilet? N  In the past six months, have you accidently  leaked urine? Y  Comment Wears brief  Do you have problems with loss of bowel control? N  Managing your Medications? N  Managing your Finances? N  Housekeeping or managing your Housekeeping? N  Some recent data might be hidden    Patient Care Team: Colon Branch, MD as PCP - General Clent Jacks, MD as Consulting Physician (Ophthalmology) Bo Merino, MD as Consulting  Physician (Rheumatology) [facility] as Audiologist (Audiology)    Assessment:   This is a routine wellness examination for Alison Graves. Physical assessment deferred to PCP.  Exercise Activities and Dietary recommendations Current Exercise Habits: The patient does not participate in regular exercise at present Diet (meal preparation, eat out, water intake, caffeinated beverages, dairy products, fruits and vegetables): well balanced   Goals     Maintain current health        Fall Risk Fall Risk  02/24/2019 02/01/2018 01/25/2017 01/18/2016 12/16/2014  Falls in the past year? 1 Yes Yes No No  Number falls in past yr: 0 1 1 - -  Injury with Fall? 0 - - - -  Follow up Education provided;Falls prevention discussed Education provided;Falls prevention discussed Education provided;Falls prevention discussed - -    Depression Screen PHQ 2/9 Scores 02/24/2019 02/01/2018 01/25/2017 01/18/2016  PHQ - 2 Score 0 0 0 0     Cognitive Function     MMSE - Mini Mental State Exam 02/24/2019 02/01/2018 01/25/2017  Not completed: Unable to complete Unable to complete -  Orientation to time - - 5  Orientation to Place - - 5  Registration - - 3  Attention/ Calculation - - 5  Recall - - 2  Language- name 2 objects - - 2  Language- repeat - - 1  Language- follow 3 step command - - 3  Language- read & follow direction - - 1  Write a sentence - - 1        Immunization History  Administered Date(s) Administered   H1N1 07/29/2008   Influenza Whole 06/13/2007, 04/24/2008, 04/19/2009, 04/06/2010    Influenza, High Dose Seasonal PF 05/24/2016, 06/29/2017, 05/29/2018   Influenza, Seasonal, Injecte, Preservative Fre 05/02/2013   Influenza,inj,Quad PF,6+ Mos 06/11/2014   Pneumococcal Conjugate-13 11/05/2013   Pneumococcal Polysaccharide-23 07/29/2008, 07/05/2018   Td 08/17/2009   Zoster 09/25/2008   Zoster Recombinat (Shingrix) 07/09/2018    Screening Tests Health Maintenance  Topic Date Due   FOOT EXAM  12/25/2018   OPHTHALMOLOGY EXAM  01/03/2019   HEMOGLOBIN A1C  01/08/2019   INFLUENZA VACCINE  04/03/2019 (Originally 02/01/2019)   TETANUS/TDAP  08/18/2019   DEXA SCAN  Completed   PNA vac Low Risk Adult  Completed      Plan:    Please schedule your next medicare wellness visit with me in 1 yr.  Continue to eat heart healthy diet (full of fruits, vegetables, whole grains, lean protein, water--limit salt, fat, and sugar intake) and increase physical activity as tolerated.  Continue doing brain stimulating activities (puzzles, reading, adult coloring books, staying active) to keep memory sharp.   I have ordered you mammogram and bone density scan. Please schedule.   I have personally reviewed and noted the following in the patients chart:    Medical and social history  Use of alcohol, tobacco or illicit drugs   Current medications and supplements  Functional ability and status  Nutritional status  Physical activity  Advanced directives  List of other physicians  Hospitalizations, surgeries, and ER visits in previous 12 months  Vitals  Screenings to include cognitive, depression, and falls  Referrals and appointments  In addition, I have reviewed and discussed with patient certain preventive protocols, quality metrics, and best practice recommendations. A written personalized care plan for preventive services as well as general preventive health recommendations were provided to patient.     [place], [place], [place]  02/24/2019

## 2019-02-23 NOTE — Assessment & Plan Note (Signed)
Sore throat, due to injury. See HPI, injury to the throat, does not seem to have a  major problem at this point given that she is breathing well, no stridor or obvious neck deformity. Recommend Tylenol, gargles with warm salt water, ER if severe pain, unable to swallow saliva, swelling of the neck, stridor or raspy voice.  All of the above was explained in simple terms to the patient and her daughter Alison Graves.  They both verbalized understanding.

## 2019-02-24 ENCOUNTER — Other Ambulatory Visit: Payer: Self-pay

## 2019-02-24 ENCOUNTER — Encounter: Payer: Self-pay | Admitting: *Deleted

## 2019-02-24 ENCOUNTER — Ambulatory Visit (INDEPENDENT_AMBULATORY_CARE_PROVIDER_SITE_OTHER): Payer: Medicare Other | Admitting: *Deleted

## 2019-02-24 DIAGNOSIS — Z1231 Encounter for screening mammogram for malignant neoplasm of breast: Secondary | ICD-10-CM

## 2019-02-24 DIAGNOSIS — Z0000 Encounter for general adult medical examination without abnormal findings: Secondary | ICD-10-CM | POA: Diagnosis not present

## 2019-02-24 DIAGNOSIS — Z780 Asymptomatic menopausal state: Secondary | ICD-10-CM

## 2019-02-24 NOTE — Patient Instructions (Signed)
Please schedule your next medicare wellness visit with me in 1 yr.  Continue to eat heart healthy diet (full of fruits, vegetables, whole grains, lean protein, water--limit salt, fat, and sugar intake) and increase physical activity as tolerated.  Continue doing brain stimulating activities (puzzles, reading, adult coloring books, staying active) to keep memory sharp.   I have ordered you mammogram and bone density scan. Please schedule.    Alison Graves , Thank you for taking time to come for your Medicare Wellness Visit. I appreciate your ongoing commitment to your health goals. Please review the following plan we discussed and let me know if I can assist you in the future.   These are the goals we discussed: Goals    . Maintain current health        This is a list of the screening recommended for you and due dates:  Health Maintenance  Topic Date Due  . Complete foot exam   12/25/2018  . Eye exam for diabetics  01/03/2019  . Hemoglobin A1C  01/08/2019  . Flu Shot  04/03/2019*  . Tetanus Vaccine  08/18/2019  . DEXA scan (bone density measurement)  Completed  . Pneumonia vaccines  Completed  *Topic was postponed. The date shown is not the original due date.    Health Maintenance After Age 98 After age 92, you are at a higher risk for certain long-term diseases and infections as well as injuries from falls. Falls are a major cause of broken bones and head injuries in people who are older than age 42. Getting regular preventive care can help to keep you healthy and well. Preventive care includes getting regular testing and making lifestyle changes as recommended by your health care provider. Talk with your health care provider about:  Which screenings and tests you should have. A screening is a test that checks for a disease when you have no symptoms.  A diet and exercise plan that is right for you. What should I know about screenings and tests to prevent falls? Screening and testing  are the best ways to find a health problem early. Early diagnosis and treatment give you the best chance of managing medical conditions that are common after age 52. Certain conditions and lifestyle choices may make you more likely to have a fall. Your health care provider may recommend:  Regular vision checks. Poor vision and conditions such as cataracts can make you more likely to have a fall. If you wear glasses, make sure to get your prescription updated if your vision changes.  Medicine review. Work with your health care provider to regularly review all of the medicines you are taking, including over-the-counter medicines. Ask your health care provider about any side effects that may make you more likely to have a fall. Tell your health care provider if any medicines that you take make you feel dizzy or sleepy.  Osteoporosis screening. Osteoporosis is a condition that causes the bones to get weaker. This can make the bones weak and cause them to break more easily.  Blood pressure screening. Blood pressure changes and medicines to control blood pressure can make you feel dizzy.  Strength and balance checks. Your health care provider may recommend certain tests to check your strength and balance while standing, walking, or changing positions.  Foot health exam. Foot pain and numbness, as well as not wearing proper footwear, can make you more likely to have a fall.  Depression screening. You may be more likely to have a  fall if you have a fear of falling, feel emotionally low, or feel unable to do activities that you used to do.  Alcohol use screening. Using too much alcohol can affect your balance and may make you more likely to have a fall. What actions can I take to lower my risk of falls? General instructions  Talk with your health care provider about your risks for falling. Tell your health care provider if: ? You fall. Be sure to tell your health care provider about all falls, even ones  that seem minor. ? You feel dizzy, sleepy, or off-balance.  Take over-the-counter and prescription medicines only as told by your health care provider. These include any supplements.  Eat a healthy diet and maintain a healthy weight. A healthy diet includes low-fat dairy products, low-fat (lean) meats, and fiber from whole grains, beans, and lots of fruits and vegetables. Home safety  Remove any tripping hazards, such as rugs, cords, and clutter.  Install safety equipment such as grab bars in bathrooms and safety rails on stairs.  Keep rooms and walkways well-lit. Activity   Follow a regular exercise program to stay fit. This will help you maintain your balance. Ask your health care provider what types of exercise are appropriate for you.  If you need a cane or walker, use it as recommended by your health care provider.  Wear supportive shoes that have nonskid soles. Lifestyle  Do not drink alcohol if your health care provider tells you not to drink.  If you drink alcohol, limit how much you have: ? 0-1 drink a day for women. ? 0-2 drinks a day for men.  Be aware of how much alcohol is in your drink. In the [place] one drink equals one typical bottle of beer (12 oz), one-half glass of wine (5 oz), or one shot of hard liquor (1 oz).  Do not use any products that contain nicotine or tobacco, such as cigarettes and e-cigarettes. If you need help quitting, ask your health care provider. Summary  Having a healthy lifestyle and getting preventive care can help to protect your health and wellness after age 83.  Screening and testing are the best way to find a health problem early and help you avoid having a fall. Early diagnosis and treatment give you the best chance for managing medical conditions that are more common for people who are older than age 83.  Falls are a major cause of broken bones and head injuries in people who are older than age 83. Take precautions to prevent a fall  at home.  Work with your health care provider to learn what changes you can make to improve your health and wellness and to prevent falls. This information is not intended to replace advice given to you by your health care provider. Make sure you discuss any questions you have with your health care provider. Document Released: 05/02/2017 Document Revised: 10/10/2018 Document Reviewed: 05/02/2017 Elsevier Patient Education  2020 [employer]

## 2019-03-04 ENCOUNTER — Other Ambulatory Visit (HOSPITAL_BASED_OUTPATIENT_CLINIC_OR_DEPARTMENT_OTHER): Payer: Medicare Other

## 2019-03-04 ENCOUNTER — Ambulatory Visit (HOSPITAL_BASED_OUTPATIENT_CLINIC_OR_DEPARTMENT_OTHER): Payer: Medicare Other

## 2019-03-06 ENCOUNTER — Other Ambulatory Visit: Payer: Self-pay

## 2019-03-06 ENCOUNTER — Ambulatory Visit (HOSPITAL_BASED_OUTPATIENT_CLINIC_OR_DEPARTMENT_OTHER)
Admission: RE | Admit: 2019-03-06 | Discharge: 2019-03-06 | Disposition: A | Discharge status: Home / Self Care | Payer: Medicare Other | Source: Ambulatory Visit | Attending: Internal Medicine | Admitting: Internal Medicine

## 2019-03-06 DIAGNOSIS — Z780 Asymptomatic menopausal state: Secondary | ICD-10-CM | POA: Diagnosis not present

## 2019-03-06 DIAGNOSIS — Z1231 Encounter for screening mammogram for malignant neoplasm of breast: Secondary | ICD-10-CM | POA: Diagnosis not present

## 2019-03-06 DIAGNOSIS — Z13820 Encounter for screening for osteoporosis: Secondary | ICD-10-CM | POA: Diagnosis not present

## 2019-04-18 ENCOUNTER — Ambulatory Visit: Payer: Medicare Other

## 2019-04-21 ENCOUNTER — Emergency Department (HOSPITAL_COMMUNITY): Payer: Medicare Other

## 2019-04-21 ENCOUNTER — Inpatient Hospital Stay (HOSPITAL_COMMUNITY)
Admission: EM | Admit: 2019-04-21 | Discharge: 2019-05-04 | DRG: 981 | Disposition: E | Payer: Medicare Other | Attending: Pulmonary Disease | Admitting: Pulmonary Disease

## 2019-04-21 ENCOUNTER — Encounter (HOSPITAL_COMMUNITY): Payer: Self-pay | Admitting: Emergency Medicine

## 2019-04-21 ENCOUNTER — Other Ambulatory Visit: Payer: Self-pay

## 2019-04-21 DIAGNOSIS — G9340 Encephalopathy, unspecified: Secondary | ICD-10-CM

## 2019-04-21 DIAGNOSIS — M19041 Primary osteoarthritis, right hand: Secondary | ICD-10-CM | POA: Diagnosis not present

## 2019-04-21 DIAGNOSIS — R918 Other nonspecific abnormal finding of lung field: Secondary | ICD-10-CM | POA: Diagnosis not present

## 2019-04-21 DIAGNOSIS — I10 Essential (primary) hypertension: Secondary | ICD-10-CM | POA: Diagnosis not present

## 2019-04-21 DIAGNOSIS — M353 Polymyalgia rheumatica: Secondary | ICD-10-CM | POA: Diagnosis present

## 2019-04-21 DIAGNOSIS — R579 Shock, unspecified: Secondary | ICD-10-CM | POA: Diagnosis not present

## 2019-04-21 DIAGNOSIS — I63512 Cerebral infarction due to unspecified occlusion or stenosis of left middle cerebral artery: Secondary | ICD-10-CM | POA: Diagnosis present

## 2019-04-21 DIAGNOSIS — I6389 Other cerebral infarction: Secondary | ICD-10-CM | POA: Diagnosis not present

## 2019-04-21 DIAGNOSIS — J80 Acute respiratory distress syndrome: Secondary | ICD-10-CM

## 2019-04-21 DIAGNOSIS — I5021 Acute systolic (congestive) heart failure: Secondary | ICD-10-CM

## 2019-04-21 DIAGNOSIS — E872 Acidosis: Secondary | ICD-10-CM | POA: Diagnosis not present

## 2019-04-21 DIAGNOSIS — I959 Hypotension, unspecified: Secondary | ICD-10-CM | POA: Diagnosis not present

## 2019-04-21 DIAGNOSIS — I160 Hypertensive urgency: Secondary | ICD-10-CM | POA: Diagnosis present

## 2019-04-21 DIAGNOSIS — J690 Pneumonitis due to inhalation of food and vomit: Secondary | ICD-10-CM | POA: Diagnosis not present

## 2019-04-21 DIAGNOSIS — E119 Type 2 diabetes mellitus without complications: Secondary | ICD-10-CM | POA: Diagnosis not present

## 2019-04-21 DIAGNOSIS — Z7984 Long term (current) use of oral hypoglycemic drugs: Secondary | ICD-10-CM

## 2019-04-21 DIAGNOSIS — R7989 Other specified abnormal findings of blood chemistry: Secondary | ICD-10-CM

## 2019-04-21 DIAGNOSIS — R778 Other specified abnormalities of plasma proteins: Secondary | ICD-10-CM

## 2019-04-21 DIAGNOSIS — I249 Acute ischemic heart disease, unspecified: Secondary | ICD-10-CM | POA: Diagnosis not present

## 2019-04-21 DIAGNOSIS — R1013 Epigastric pain: Secondary | ICD-10-CM | POA: Diagnosis not present

## 2019-04-21 DIAGNOSIS — M19042 Primary osteoarthritis, left hand: Secondary | ICD-10-CM | POA: Diagnosis present

## 2019-04-21 DIAGNOSIS — X58XXXA Exposure to other specified factors, initial encounter: Secondary | ICD-10-CM | POA: Diagnosis present

## 2019-04-21 DIAGNOSIS — I63232 Cerebral infarction due to unspecified occlusion or stenosis of left carotid arteries: Secondary | ICD-10-CM | POA: Diagnosis not present

## 2019-04-21 DIAGNOSIS — R29717 NIHSS score 17: Secondary | ICD-10-CM | POA: Diagnosis not present

## 2019-04-21 DIAGNOSIS — J9601 Acute respiratory failure with hypoxia: Principal | ICD-10-CM | POA: Diagnosis present

## 2019-04-21 DIAGNOSIS — Z7982 Long term (current) use of aspirin: Secondary | ICD-10-CM

## 2019-04-21 DIAGNOSIS — I2510 Atherosclerotic heart disease of native coronary artery without angina pectoris: Secondary | ICD-10-CM | POA: Diagnosis not present

## 2019-04-21 DIAGNOSIS — I5041 Acute combined systolic (congestive) and diastolic (congestive) heart failure: Secondary | ICD-10-CM | POA: Diagnosis present

## 2019-04-21 DIAGNOSIS — M19072 Primary osteoarthritis, left ankle and foot: Secondary | ICD-10-CM | POA: Diagnosis not present

## 2019-04-21 DIAGNOSIS — J189 Pneumonia, unspecified organism: Secondary | ICD-10-CM

## 2019-04-21 DIAGNOSIS — R0902 Hypoxemia: Secondary | ICD-10-CM | POA: Diagnosis not present

## 2019-04-21 DIAGNOSIS — Z66 Do not resuscitate: Secondary | ICD-10-CM | POA: Diagnosis not present

## 2019-04-21 DIAGNOSIS — Z20828 Contact with and (suspected) exposure to other viral communicable diseases: Secondary | ICD-10-CM | POA: Diagnosis present

## 2019-04-21 DIAGNOSIS — I6340 Cerebral infarction due to embolism of unspecified cerebral artery: Secondary | ICD-10-CM | POA: Insufficient documentation

## 2019-04-21 DIAGNOSIS — Z833 Family history of diabetes mellitus: Secondary | ICD-10-CM

## 2019-04-21 DIAGNOSIS — E8729 Other acidosis: Secondary | ICD-10-CM | POA: Insufficient documentation

## 2019-04-21 DIAGNOSIS — M8580 Other specified disorders of bone density and structure, unspecified site: Secondary | ICD-10-CM | POA: Diagnosis present

## 2019-04-21 DIAGNOSIS — Z4682 Encounter for fitting and adjustment of non-vascular catheter: Secondary | ICD-10-CM | POA: Diagnosis not present

## 2019-04-21 DIAGNOSIS — T18108A Unspecified foreign body in esophagus causing other injury, initial encounter: Secondary | ICD-10-CM

## 2019-04-21 DIAGNOSIS — E7800 Pure hypercholesterolemia, unspecified: Secondary | ICD-10-CM | POA: Diagnosis not present

## 2019-04-21 DIAGNOSIS — Z8249 Family history of ischemic heart disease and other diseases of the circulatory system: Secondary | ICD-10-CM

## 2019-04-21 DIAGNOSIS — J810 Acute pulmonary edema: Secondary | ICD-10-CM

## 2019-04-21 DIAGNOSIS — Z9289 Personal history of other medical treatment: Secondary | ICD-10-CM

## 2019-04-21 DIAGNOSIS — Z781 Physical restraint status: Secondary | ICD-10-CM

## 2019-04-21 DIAGNOSIS — M19071 Primary osteoarthritis, right ankle and foot: Secondary | ICD-10-CM | POA: Diagnosis present

## 2019-04-21 DIAGNOSIS — R1084 Generalized abdominal pain: Secondary | ICD-10-CM | POA: Diagnosis not present

## 2019-04-21 DIAGNOSIS — Z01818 Encounter for other preprocedural examination: Secondary | ICD-10-CM | POA: Diagnosis not present

## 2019-04-21 DIAGNOSIS — Z515 Encounter for palliative care: Secondary | ICD-10-CM | POA: Diagnosis not present

## 2019-04-21 DIAGNOSIS — L8990 Pressure ulcer of unspecified site, unspecified stage: Secondary | ICD-10-CM | POA: Insufficient documentation

## 2019-04-21 DIAGNOSIS — G8220 Paraplegia, unspecified: Secondary | ICD-10-CM | POA: Diagnosis not present

## 2019-04-21 DIAGNOSIS — I110 Hypertensive heart disease with heart failure: Secondary | ICD-10-CM | POA: Diagnosis not present

## 2019-04-21 DIAGNOSIS — M170 Bilateral primary osteoarthritis of knee: Secondary | ICD-10-CM | POA: Diagnosis present

## 2019-04-21 DIAGNOSIS — J90 Pleural effusion, not elsewhere classified: Secondary | ICD-10-CM | POA: Diagnosis not present

## 2019-04-21 DIAGNOSIS — M1120 Other chondrocalcinosis, unspecified site: Secondary | ICD-10-CM | POA: Diagnosis present

## 2019-04-21 DIAGNOSIS — E1165 Type 2 diabetes mellitus with hyperglycemia: Secondary | ICD-10-CM | POA: Diagnosis not present

## 2019-04-21 DIAGNOSIS — J984 Other disorders of lung: Secondary | ICD-10-CM | POA: Diagnosis not present

## 2019-04-21 DIAGNOSIS — E785 Hyperlipidemia, unspecified: Secondary | ICD-10-CM | POA: Diagnosis not present

## 2019-04-21 DIAGNOSIS — Z79899 Other long term (current) drug therapy: Secondary | ICD-10-CM

## 2019-04-21 DIAGNOSIS — L89151 Pressure ulcer of sacral region, stage 1: Secondary | ICD-10-CM | POA: Diagnosis present

## 2019-04-21 DIAGNOSIS — Z7189 Other specified counseling: Secondary | ICD-10-CM | POA: Diagnosis not present

## 2019-04-21 DIAGNOSIS — J168 Pneumonia due to other specified infectious organisms: Secondary | ICD-10-CM | POA: Diagnosis not present

## 2019-04-21 DIAGNOSIS — I639 Cerebral infarction, unspecified: Secondary | ICD-10-CM | POA: Diagnosis not present

## 2019-04-21 DIAGNOSIS — R109 Unspecified abdominal pain: Secondary | ICD-10-CM | POA: Diagnosis not present

## 2019-04-21 DIAGNOSIS — T189XXA Foreign body of alimentary tract, part unspecified, initial encounter: Secondary | ICD-10-CM

## 2019-04-21 DIAGNOSIS — F419 Anxiety disorder, unspecified: Secondary | ICD-10-CM | POA: Diagnosis present

## 2019-04-21 DIAGNOSIS — R000 Tachycardia, unspecified: Secondary | ICD-10-CM | POA: Diagnosis not present

## 2019-04-21 HISTORY — DX: Type 2 diabetes mellitus without complications: E11.9

## 2019-04-21 LAB — CBC WITH DIFFERENTIAL/PLATELET
Abs Immature Granulocytes: 0.04 10*3/uL (ref 0.00–0.07)
Basophils Absolute: 0.1 10*3/uL (ref 0.0–0.1)
Basophils Relative: 1 %
Eosinophils Absolute: 0 10*3/uL (ref 0.0–0.5)
Eosinophils Relative: 0 %
HCT: 50.8 % — ABNORMAL HIGH (ref 36.0–46.0)
Hemoglobin: 16.7 g/dL — ABNORMAL HIGH (ref 12.0–15.0)
Immature Granulocytes: 0 %
Lymphocytes Relative: 17 %
Lymphs Abs: 2.2 10*3/uL (ref 0.7–4.0)
MCH: 30.8 pg (ref 26.0–34.0)
MCHC: 32.9 g/dL (ref 30.0–36.0)
MCV: 93.6 fL (ref 80.0–100.0)
Monocytes Absolute: 0.5 10*3/uL (ref 0.1–1.0)
Monocytes Relative: 4 %
Neutro Abs: 10.1 10*3/uL — ABNORMAL HIGH (ref 1.7–7.7)
Neutrophils Relative %: 78 %
Platelets: 301 10*3/uL (ref 150–400)
RBC: 5.43 MIL/uL — ABNORMAL HIGH (ref 3.87–5.11)
RDW: 13.7 % (ref 11.5–15.5)
WBC: 12.9 10*3/uL — ABNORMAL HIGH (ref 4.0–10.5)
nRBC: 0 % (ref 0.0–0.2)

## 2019-04-21 LAB — POCT I-STAT 7, (LYTES, BLD GAS, ICA,H+H)
Acid-base deficit: 4 mmol/L — ABNORMAL HIGH (ref 0.0–2.0)
Bicarbonate: 23.9 mmol/L (ref 20.0–28.0)
Calcium, Ion: 1.27 mmol/L (ref 1.15–1.40)
HCT: 50 % — ABNORMAL HIGH (ref 36.0–46.0)
Hemoglobin: 17 g/dL — ABNORMAL HIGH (ref 12.0–15.0)
O2 Saturation: 93 %
Patient temperature: 98.6
Potassium: 3.9 mmol/L (ref 3.5–5.1)
Sodium: 141 mmol/L (ref 135–145)
TCO2: 25 mmol/L (ref 22–32)
pCO2 arterial: 50.8 mmHg — ABNORMAL HIGH (ref 32.0–48.0)
pH, Arterial: 7.282 — ABNORMAL LOW (ref 7.350–7.450)
pO2, Arterial: 76 mmHg — ABNORMAL LOW (ref 83.0–108.0)

## 2019-04-21 LAB — LACTIC ACID, PLASMA
Lactic Acid, Venous: 4 mmol/L (ref 0.5–1.9)
Lactic Acid, Venous: 3.3 mmol/L (ref 0.5–1.9)

## 2019-04-21 LAB — COMPREHENSIVE METABOLIC PANEL
ALT: 20 U/L (ref 0–44)
ALT: 19 U/L (ref 0–44)
AST: 50 U/L — ABNORMAL HIGH (ref 15–41)
AST: 52 U/L — ABNORMAL HIGH (ref 15–41)
Albumin: 4 g/dL (ref 3.5–5.0)
Albumin: 3.5 g/dL (ref 3.5–5.0)
Alkaline Phosphatase: 78 U/L (ref 38–126)
Alkaline Phosphatase: 74 U/L (ref 38–126)
Anion gap: 15 (ref 5–15)
Anion gap: 15 (ref 5–15)
BUN: 21 mg/dL (ref 8–23)
BUN: 24 mg/dL — ABNORMAL HIGH (ref 8–23)
CO2: 22 mmol/L (ref 22–32)
CO2: 21 mmol/L — ABNORMAL LOW (ref 22–32)
Calcium: 10.1 mg/dL (ref 8.9–10.3)
Calcium: 8.9 mg/dL (ref 8.9–10.3)
Chloride: 98 mmol/L (ref 98–111)
Chloride: 99 mmol/L (ref 98–111)
Creatinine, Ser: 1.05 mg/dL — ABNORMAL HIGH (ref 0.44–1.00)
Creatinine, Ser: 1.11 mg/dL — ABNORMAL HIGH (ref 0.44–1.00)
GFR calc Af Amer: 56 mL/min — ABNORMAL LOW (ref >60)
GFR calc Af Amer: 52 mL/min — ABNORMAL LOW (ref >60)
GFR calc non Af Amer: 48 mL/min — ABNORMAL LOW (ref >60)
GFR calc non Af Amer: 45 mL/min — ABNORMAL LOW (ref >60)
Glucose, Bld: 262 mg/dL — ABNORMAL HIGH (ref 70–99)
Glucose, Bld: 189 mg/dL — ABNORMAL HIGH (ref 70–99)
Potassium: 5.5 mmol/L — ABNORMAL HIGH (ref 3.5–5.1)
Potassium: 5 mmol/L (ref 3.5–5.1)
Sodium: 135 mmol/L (ref 135–145)
Sodium: 135 mmol/L (ref 135–145)
Total Bilirubin: 1.7 mg/dL — ABNORMAL HIGH (ref 0.3–1.2)
Total Bilirubin: 1.6 mg/dL — ABNORMAL HIGH (ref 0.3–1.2)
Total Protein: 7.7 g/dL (ref 6.5–8.1)
Total Protein: 7.1 g/dL (ref 6.5–8.1)

## 2019-04-21 LAB — BLOOD GAS, ARTERIAL
Acid-base deficit: 2.9 mmol/L — ABNORMAL HIGH (ref 0.0–2.0)
Bicarbonate: 22.9 mmol/L (ref 20.0–28.0)
Drawn by: 350431
FIO2: 100
MECHVT: 330 mL
O2 Saturation: 97.4 %
PEEP: 8 cmH2O
Patient temperature: 97.7
RATE: 20 resp/min
pCO2 arterial: 49.2 mmHg — ABNORMAL HIGH (ref 32.0–48.0)
pH, Arterial: 7.286 — ABNORMAL LOW (ref 7.350–7.450)
pO2, Arterial: 125 mmHg — ABNORMAL HIGH (ref 83.0–108.0)

## 2019-04-21 LAB — ABO/RH: ABO/RH(D): O POS

## 2019-04-21 LAB — I-STAT CHEM 8, ED
BUN: 30 mg/dL — ABNORMAL HIGH (ref 8–23)
Calcium, Ion: 1.13 mmol/L — ABNORMAL LOW (ref 1.15–1.40)
Chloride: 104 mmol/L (ref 98–111)
Creatinine, Ser: 1 mg/dL (ref 0.44–1.00)
Glucose, Bld: 262 mg/dL — ABNORMAL HIGH (ref 70–99)
HCT: 51 % — ABNORMAL HIGH (ref 36.0–46.0)
Hemoglobin: 17.3 g/dL — ABNORMAL HIGH (ref 12.0–15.0)
Potassium: 5.4 mmol/L — ABNORMAL HIGH (ref 3.5–5.1)
Sodium: 137 mmol/L (ref 135–145)
TCO2: 25 mmol/L (ref 22–32)

## 2019-04-21 LAB — URINALYSIS, ROUTINE W REFLEX MICROSCOPIC
Bilirubin Urine: NEGATIVE
Glucose, UA: 50 mg/dL — AB
Hgb urine dipstick: NEGATIVE
Ketones, ur: 5 mg/dL — AB
Leukocytes,Ua: NEGATIVE
Nitrite: NEGATIVE
Protein, ur: 100 mg/dL — AB
Specific Gravity, Urine: >1.046 — ABNORMAL HIGH (ref 1.005–1.030)
pH: 5 (ref 5.0–8.0)

## 2019-04-21 LAB — TROPONIN I (HIGH SENSITIVITY)
Troponin I (High Sensitivity): 279 ng/L (ref <18)
Troponin I (High Sensitivity): 553 ng/L (ref <18)
Troponin I (High Sensitivity): 790 ng/L (ref <18)
Troponin I (High Sensitivity): 771 ng/L (ref <18)

## 2019-04-21 LAB — TYPE AND SCREEN
ABO/RH(D): O POS
Antibody Screen: NEGATIVE

## 2019-04-21 LAB — BRAIN NATRIURETIC PEPTIDE: B Natriuretic Peptide: 317.6 pg/mL — ABNORMAL HIGH (ref 0.0–100.0)

## 2019-04-21 LAB — STREP PNEUMONIAE URINARY ANTIGEN: Strep Pneumo Urinary Antigen: NEGATIVE

## 2019-04-21 LAB — BETA-HYDROXYBUTYRIC ACID: Beta-Hydroxybutyric Acid: 0.45 mmol/L — ABNORMAL HIGH (ref 0.05–0.27)

## 2019-04-21 LAB — SARS CORONAVIRUS 2 BY RT PCR (HOSPITAL ORDER, PERFORMED IN ~~LOC~~ HOSPITAL LAB): SARS Coronavirus 2: NEGATIVE

## 2019-04-21 LAB — GLUCOSE, CAPILLARY: Glucose-Capillary: 174 mg/dL — ABNORMAL HIGH (ref 70–99)

## 2019-04-21 LAB — LIPASE, BLOOD: Lipase: 16 U/L (ref 11–51)

## 2019-04-21 MED ORDER — SODIUM CHLORIDE 0.9 % IV SOLN
2.0000 g | Freq: Once | INTRAVENOUS | Status: AC
  Administered 2019-04-21: 17:00:00 2 g via INTRAVENOUS
  Filled 2019-04-21: qty 2

## 2019-04-21 MED ORDER — METRONIDAZOLE IN NACL 5-0.79 MG/ML-% IV SOLN
500.0000 mg | Freq: Three times a day (TID) | INTRAVENOUS | Status: DC
  Administered 2019-04-21 – 2019-04-22 (×2): 500 mg via INTRAVENOUS
  Filled 2019-04-21 (×2): qty 100

## 2019-04-21 MED ORDER — ETOMIDATE 2 MG/ML IV SOLN
18.0000 mg | Freq: Once | INTRAVENOUS | Status: AC
  Administered 2019-04-21: 18 mg via INTRAVENOUS

## 2019-04-21 MED ORDER — IPRATROPIUM-ALBUTEROL 0.5-2.5 (3) MG/3ML IN SOLN: 3.0000 mL | Freq: Four times a day (QID) | RESPIRATORY_TRACT | Status: DC | PRN

## 2019-04-21 MED ORDER — SODIUM CHLORIDE 0.9% FLUSH
3.0000 mL | Freq: Once | INTRAVENOUS | Status: AC
  Administered 2019-04-21: 16:00:00 3 mL via INTRAVENOUS

## 2019-04-21 MED ORDER — ORAL CARE MOUTH RINSE
15.0000 mL | OROMUCOSAL | Status: DC
  Administered 2019-04-21 – 2019-04-26 (×44): 15 mL via OROMUCOSAL

## 2019-04-21 MED ORDER — FUROSEMIDE 10 MG/ML IJ SOLN
60.0000 mg | Freq: Once | INTRAMUSCULAR | Status: AC
  Administered 2019-04-21: 60 mg via INTRAVENOUS
  Filled 2019-04-21: qty 6

## 2019-04-21 MED ORDER — PROPOFOL 1000 MG/100ML IV EMUL
INTRAVENOUS | Status: AC
  Administered 2019-04-21: 10 ug/kg/min via INTRAVENOUS
  Filled 2019-04-21: qty 100

## 2019-04-21 MED ORDER — PROPOFOL 1000 MG/100ML IV EMUL
5.0000 ug/kg/min | INTRAVENOUS | Status: DC
  Administered 2019-04-21: 10 ug/kg/min via INTRAVENOUS
  Administered 2019-04-21: 21:00:00 5 ug/kg/min via INTRAVENOUS
  Filled 2019-04-21: qty 100

## 2019-04-21 MED ORDER — FENTANYL BOLUS VIA INFUSION
25.0000 ug | INTRAVENOUS | Status: DC | PRN
  Administered 2019-04-22 – 2019-04-26 (×22): 25 ug via INTRAVENOUS
  Filled 2019-04-21: qty 25

## 2019-04-21 MED ORDER — INSULIN ASPART 100 UNIT/ML ~~LOC~~ SOLN
0.0000 [IU] | SUBCUTANEOUS | Status: DC
  Administered 2019-04-21: 2 [IU] via SUBCUTANEOUS
  Administered 2019-04-22 (×4): 1 [IU] via SUBCUTANEOUS
  Administered 2019-04-23 (×2): 2 [IU] via SUBCUTANEOUS
  Administered 2019-04-23 (×2): 1 [IU] via SUBCUTANEOUS
  Administered 2019-04-24 (×3): 2 [IU] via SUBCUTANEOUS
  Administered 2019-04-24: 3 [IU] via SUBCUTANEOUS
  Administered 2019-04-24: 2 [IU] via SUBCUTANEOUS
  Administered 2019-04-24: 3 [IU] via SUBCUTANEOUS
  Administered 2019-04-25 (×2): 2 [IU] via SUBCUTANEOUS
  Administered 2019-04-25: 3 [IU] via SUBCUTANEOUS
  Administered 2019-04-25: 1 [IU] via SUBCUTANEOUS
  Administered 2019-04-25: 3 [IU] via SUBCUTANEOUS
  Administered 2019-04-25 – 2019-04-26 (×5): 2 [IU] via SUBCUTANEOUS

## 2019-04-21 MED ORDER — ROCURONIUM BROMIDE 50 MG/5ML IV SOLN
60.0000 mg | Freq: Once | INTRAVENOUS | Status: AC
  Administered 2019-04-21: 21:00:00 60 mg via INTRAVENOUS
  Filled 2019-04-21: qty 6

## 2019-04-21 MED ORDER — MIDAZOLAM HCL 2 MG/2ML IJ SOLN
1.0000 mg | INTRAMUSCULAR | Status: DC | PRN
  Administered 2019-04-22: 1 mg via INTRAVENOUS
  Filled 2019-04-21: qty 2

## 2019-04-21 MED ORDER — MORPHINE SULFATE (PF) 4 MG/ML IV SOLN
4.0000 mg | Freq: Once | INTRAVENOUS | Status: AC
  Administered 2019-04-21: 19:00:00 4 mg via INTRAVENOUS
  Filled 2019-04-21: qty 1

## 2019-04-21 MED ORDER — VANCOMYCIN HCL IN DEXTROSE 1-5 GM/200ML-% IV SOLN: 1000.0000 mg | INTRAVENOUS | Status: DC

## 2019-04-21 MED ORDER — IOHEXOL 350 MG/ML SOLN
100.0000 mL | Freq: Once | INTRAVENOUS | Status: AC | PRN
  Administered 2019-04-21: 20:00:00 100 mL via INTRAVENOUS

## 2019-04-21 MED ORDER — INSULIN REGULAR(HUMAN) IN NACL 100-0.9 UT/100ML-% IV SOLN: INTRAVENOUS | Status: DC

## 2019-04-21 MED ORDER — ASPIRIN 300 MG RE SUPP
300.0000 mg | Freq: Once | RECTAL | Status: AC
  Administered 2019-04-21: 300 mg via RECTAL
  Filled 2019-04-21: qty 1

## 2019-04-21 MED ORDER — SODIUM CHLORIDE 0.9 % IV SOLN
2.0000 g | Freq: Two times a day (BID) | INTRAVENOUS | Status: DC
  Administered 2019-04-22 – 2019-04-24 (×5): 2 g via INTRAVENOUS
  Filled 2019-04-21 (×5): qty 2

## 2019-04-21 MED ORDER — CHLORHEXIDINE GLUCONATE CLOTH 2 % EX PADS
6.0000 | MEDICATED_PAD | Freq: Every day | CUTANEOUS | Status: DC
  Administered 2019-04-22: 6 via TOPICAL

## 2019-04-21 MED ORDER — FENTANYL 2500MCG IN NS 250ML (10MCG/ML) PREMIX INFUSION
0.0000 ug/h | INTRAVENOUS | Status: DC
  Administered 2019-04-21: 22:00:00 25 ug/h via INTRAVENOUS
  Administered 2019-04-22 – 2019-04-23 (×5): 400 ug/h via INTRAVENOUS
  Administered 2019-04-25: 165 ug/h via INTRAVENOUS
  Administered 2019-04-25: 200 ug/h via INTRAVENOUS
  Administered 2019-04-26: 275 ug/h via INTRAVENOUS
  Administered 2019-04-26: 300 ug/h via INTRAVENOUS
  Filled 2019-04-21 (×11): qty 250

## 2019-04-21 MED ORDER — MIDAZOLAM HCL 2 MG/2ML IJ SOLN
1.0000 mg | INTRAMUSCULAR | Status: AC | PRN
  Administered 2019-04-22 (×3): 1 mg via INTRAVENOUS
  Filled 2019-04-21 (×2): qty 2

## 2019-04-21 MED ORDER — VANCOMYCIN HCL 10 G IV SOLR
1250.0000 mg | Freq: Once | INTRAVENOUS | Status: AC
  Administered 2019-04-21: 1250 mg via INTRAVENOUS
  Filled 2019-04-21 (×3): qty 1250

## 2019-04-21 MED ORDER — CHLORHEXIDINE GLUCONATE 0.12% ORAL RINSE (MEDLINE KIT)
15.0000 mL | Freq: Two times a day (BID) | OROMUCOSAL | Status: DC
  Administered 2019-04-21 – 2019-04-26 (×10): 15 mL via OROMUCOSAL

## 2019-04-21 MED ORDER — FENTANYL CITRATE (PF) 100 MCG/2ML IJ SOLN
50.0000 ug | INTRAMUSCULAR | Status: AC | PRN
  Administered 2019-04-21 – 2019-04-23 (×3): 50 ug via INTRAVENOUS
  Filled 2019-04-21 (×2): qty 2

## 2019-04-21 MED ORDER — HEPARIN BOLUS VIA INFUSION
3000.0000 [IU] | Freq: Once | INTRAVENOUS | Status: AC
  Administered 2019-04-21: 3000 [IU] via INTRAVENOUS
  Filled 2019-04-21: qty 3000

## 2019-04-21 MED ORDER — PANTOPRAZOLE SODIUM 40 MG IV SOLR
40.0000 mg | Freq: Every day | INTRAVENOUS | Status: DC
  Administered 2019-04-21 – 2019-04-25 (×5): 40 mg via INTRAVENOUS
  Filled 2019-04-21 (×5): qty 40

## 2019-04-21 MED ORDER — FENTANYL CITRATE (PF) 100 MCG/2ML IJ SOLN
25.0000 ug | Freq: Once | INTRAMUSCULAR | Status: AC
  Administered 2019-04-21: 25 ug via INTRAVENOUS
  Filled 2019-04-21: qty 2

## 2019-04-21 MED ORDER — SODIUM CHLORIDE 0.9 % IV SOLN
INTRAVENOUS | Status: DC | PRN
  Administered 2019-04-21: 200 mL via INTRAVENOUS
  Administered 2019-04-22 – 2019-04-23 (×2): 250 mL via INTRAVENOUS

## 2019-04-21 MED ORDER — GABAPENTIN 250 MG/5ML PO SOLN
100.0000 mg | Freq: Three times a day (TID) | ORAL | Status: DC
  Administered 2019-04-21 – 2019-04-26 (×14): 100 mg
  Filled 2019-04-21 (×16): qty 2

## 2019-04-21 MED ORDER — HEPARIN (PORCINE) 25000 UT/250ML-% IV SOLN
700.0000 [IU]/h | INTRAVENOUS | Status: AC
  Administered 2019-04-21 – 2019-04-23 (×2): 700 [IU]/h via INTRAVENOUS
  Filled 2019-04-21 (×2): qty 250

## 2019-04-21 NOTE — ED Notes (Signed)
Date and time results received: 04/09/2019 1638 (use smartphrase ".now" to insert current time)  Test: trop Critical Value: 279  Name of Provider Notified: [email or web address]  Orders Received? Or Actions Taken?: .

## 2019-04-21 NOTE — ED Notes (Signed)
EDP notified on patient's low O2 sat= 78% while on NRB mask 15 lpm.

## 2019-04-21 NOTE — Progress Notes (Signed)
Pharmacy Antibiotic Note  Alison Graves is a 85 y.o. female admitted on 04/25/2019 with pneumonia.  Pharmacy has been consulted for vancomycin and cefepime dosing.  Presented with abdominal pain with low O2 sats in ED, now s/p intubation. CTA showing extensive bilateral multifocal airspace consolidation concerning for multifocal PNA. WBC 12.9, LA 4>3.3, afebrile. Scr 1 (CrCl 31 mL/min).   Plan: Cefepime 2 g IV every 12 hours  Vancomycin 1250 mg IV once then 1g IV every 48 hours (estAUC 442) Monitor renal fx, cx results, clinical pic, and vanc levels as appropriate  Height: 4\' 10"  (147.3 cm) Weight: 131 lb (59.4 kg) IBW/kg (Calculated) : 40.9  Temp (24hrs), Avg:97.7 F (36.5 C), Min:97.7 F (36.5 C), Max:97.7 F (36.5 C)  Recent Labs  Lab 04/25/19 1542 Apr 25, 2019 1545 04-25-19 1551 04/25/19 1937  WBC  --  12.9*  --   --   CREATININE  --  1.05* 1.00  --   LATICACIDVEN 4.0*  --   --  3.3*    Estimated Creatinine Clearance: 31.4 mL/min (by C-G formula based on SCr of 1 mg/dL).    No Known Allergies  Antimicrobials this admission: Vancomycin 10/19 >>  Cefepime 10/19 >>   Dose adjustments this admission: N/A  Microbiology results: 10/19 BCx: sent 10/19 Resp PCR: sent  10/19 Sputum: sent  10/19 COVID: neg  Thank you for allowing pharmacy to be a part of this patient's care.  Antonietta Jewel, PharmD, BCCCP Clinical Pharmacist  Phone: [phone]  Please check AMION for all [place] phone numbers After 10:00 PM, call Why [phone] 04-25-19 9:59 PM

## 2019-04-21 NOTE — Progress Notes (Signed)
ANTICOAGULATION CONSULT NOTE - Initial Consult  Pharmacy Consult for Heparin Indication: chest pain/ACS  No Known Allergies  Patient Measurements: Weight: 131 lb (59.4 kg) Heparin Dosing Weight: TBW  Vital Signs: Temp: 97.7 F (36.5 C) (10/19 1608) Temp Source: Rectal (10/19 1608) BP: 99/67 (10/19 2110) Pulse Rate: 103 (10/19 2110)  Labs: Recent Labs    04/04/2019 1545 04/30/2019 1551 04/06/2019 1937  HGB 16.7* 17.3*  --   HCT 50.8* 51.0*  --   PLT 301  --   --   CREATININE 1.05* 1.00  --   TROPONINIHS 279*  --  553*    CrCl cannot be calculated (Unknown ideal weight.).   Medical History: Past Medical History:  Diagnosis Date  . Diabetes mellitus without complication ([place])   . Hemorrhoids   . HTN (hypertension)   . Hyperlipemia   . Microhematuria    s/p eval DR Tannenbaum 2004  . Osteoarthritis   . PMR (polymyalgia rheumatica) ([facility])    ?    Medications:  Scheduled:  . heparin  3,000 Units Intravenous Once   Infusions:  . heparin    . propofol (DIPRIVAN) infusion 5 mcg/kg/min (04/22/2019 2101)    Assessment: Pt is a 85 y/o female who presented with abdominal pain. Pt was very tachypneic, hypoxic, and in distress, which required intubation. Due to troponin elevation, there is concern for ACS. Pharmacy consulted to dose heparin.  Pt's Hg/Hct is on high at 17.3/51. Plts are wnls. Troponins are elevated 279 >> 553.  Goal of Therapy:  Heparin level 0.3-0.7 units/ml Monitor platelets by anticoagulation protocol: Yes   Plan:  Give 3000 units bolus x 1 Start heparin infusion at 700 units/hr Check anti-Xa level in 8 hours and daily while on heparin Continue to monitor H&H and platelets  Monitor for signs/symptoms of bleeding  Sherren Kerns, PharmD PGY1 Acute Care Pharmacy Resident 04/24/2019,9:26 PM

## 2019-04-21 NOTE — H&P (Addendum)
..   NAME:  Alison Graves, MRN:  [id number], DOB:  1932-01-30, LOS: 0 ADMISSION DATE:  04/03/2019, CONSULTATION DATE:  04/05/2019 REFERRING MD:  Wilson Singer MD, CHIEF COMPLAINT:  SOB and Abdominal Pain   Brief History   85 yr old F w/ PMHx sig for DM and HTN presented on 10/19 to urgent care with her daughter for SOB  History of present illness   ( History obtained from review of EMR, account of other providers and patient's daughter)  39 yr old F w/ PMHx significant for NIDDM  (on Metformin), HTN, presents to [facility] from Urgent care. Her initial complaint was abdominal pain in the epigastric location. Per EMD pt was pale in appearance and O2 sat 89% on simple face mask. Her Blood glucose was 333 mg/dl. Patiient's daughter provided translation and pt described her abdominal pain as intermittent and starting over the past few days.  Around 10 am on 10/19 the pain began and the patient stated that it became progressively worse with time.  It was constant and severe during her evaluation in the ED.  She denied radiation and stated that the pain was worse with movement.  Per EDP documentation pt initially improved with non-rebreather. Physical exam revealed no rebound or guardiung and pt was afebrile. CXR performed and then pt sent for CT.   Pt returned from CT hypoxic and in distress: O2 sat 78% and she was started on NRB at 15 L. Pt was in obvious distress.  Evaluated by EDP and intubated for acute resp failure with hypoxia.  PCCM consulted for admission.   Past Medical History  .Marland Kitchen Active Ambulatory Problems    Diagnosis Date Noted  . Dyslipidemia 11/29/2006  . HTN (hypertension) 11/20/2006  . HEMORRHOIDS 07/25/2006  . MENOPAUSE-RELATED VASOMOTOR SYMPTOMS 04/19/2009  . Osteoarthritis of both knees 12/16/2008  . Polymyalgia rheumatica ([place]) 01/10/2008  . Annual physical exam 10/04/2010  . Osteopenia 10/04/2010  . Anxiety 11/16/2011  . Edema 02/17/2015  . PCP NOTES >>>>>>>>>>>>>>>>>>>>>>>>>  09/26/2015  . Chondrocalcinosis 04/24/2016  . Osteoarthritis of patellofemoral joints of both knees 04/24/2016  . Osteoarthritis of both hands 04/24/2016  . Osteoarthritis of feet, bilateral 04/24/2016  . Diabetes mellitus, type II ([place]) 01/25/2017   Resolved Ambulatory Problems    Diagnosis Date Noted  . WOUND, HAND 04/21/2010  . CHEST PAIN, ATYPICAL 08/26/2010  . HTN (hypertension)   . Bronchitis 07/12/2012   Past Medical History:  Diagnosis Date  . Diabetes mellitus without complication ([place])   . Hemorrhoids   . Hyperlipemia   . Microhematuria   . Osteoarthritis   . PMR (polymyalgia rheumatica) ([place])     Significant Hospital Events   Endotracheally intubated  Consults:  04/19/2019>>PCCM  Procedures:  04/25/2019>>Endotracheal Intubation  Significant Diagnostic Tests:  CT CHEST ABDOMEN & PELVIS:  Micro Data:  04/13/2019>>>RVP pending 04/03/2019>>>Trach culture pending 04/30/2019>>>>SARSCOV2 negative  Antimicrobials:  04/09/2019>>Cefepime 2g IV x 1 dose given in ED    Objective   Blood pressure 132/84, pulse (!) 110, temperature 97.7 F (36.5 C), temperature source Rectal, resp. rate 17, weight 59.4 kg, SpO2 97 %.    Vent Mode: PRVC FiO2 (%):  [92 %-100 %] 100 % Set Rate:  [20 bmp] 20 bmp Vt Set:  [330 mL] 330 mL PEEP:  [8 cmH20] 8 cmH20 Plateau Pressure:  [24 cmH20] 24 cmH20   Intake/Output Summary (Last 24 hours) at 05/03/2019 2147 Last data filed at 05/03/2019 1832 Gross per 24 hour  Intake 100 ml  Output -  Net 100 ml   Filed Weights   05/03/2019 2000  Weight: 59.4 kg    Examination: General: elderly appearing female on vent.  HENT: [place]/AT, PERRL, nO JVD Lungs: Rhonchi worse on R than Left. Diminished R base.  Cardiovascular: RRR, no MRG Abdomen: Soft non-tender, non-distended Extremities: No acute deformity. No edema.  Neuro: Spontaneously awake, but appears comfortable. RASS 0  Resolved Hospital Problem list     Assessment & Plan:   1. Acute Respiratory Failure w/ hypoxia Endotracheally intubated No evidence of acute PE CT chest shows bilateral multifocal airspace consolidation esp in RLL Concern for post obstructive pneumonia vs aspiration ABG 7.282/50/76/23 Plan: TV 6cc/kg increased RR PEEP 8 >>ARDS protocol F/u ABG post intubation Continue on broad spectrum abx Vancomycin and Cefepime w/ Flagyl F/u Trach cx, RVP, Strep and Legionella May need bronchoscopy for further evaluation   2. Elevated troponin EKG shows prolonged QT, LAFB, and LVH ( prev EKG in 2014 shows LVH w/ prolonged QT) Epigastric pain in Diabetic female>>suspicious for ACS Plan: F/u Cardio consult  Continue Heparin gtt started in ED Trend Trops and EKG  3. NIDDM Previously on Metformin Pt BG >300 on presentation with AG Metabolic Acidosis + ketones on UA Plan: Strong suspicion for DKA Hold metformin inpatient Start on Insulin gtt Check Hgb A1c Check Betahydroxybutyrate  4. HTN Urgency On Clonidine, Coreg, Losartan and HCTZ Highest BP in ED>>175/171mHg Plan: Restart Clonidine at 0.175mvia OGT Goal BP <140/90 mmHg  5. CHF No previous ECHO On CTAP evidence of Reflux of contrast material from the right atrium into the IVC and hepatic veins compatible with passive venous congestion. Plan: Check BNP Get 2D ECHO to assess LVEF F/u Cardiology's recommendations  6. CAD H/o Dyslipidemia Three vessel coronary artery atherosclerotic calcifications seen on CTA Pt is on Omega 3 and Ezetimbe on review of home meds On daily ASA 818miven this info increased risk for ACS Plan: Check Lipid panel    Best practice:  Diet: NPO Pain/Anxiety/Delirium protocol (if indicated): per protocol Fentanyl with intermittent versed d/c propofol VAP protocol (if indicated): yes DVT prophylaxis: on Heparin gtt for ACS GI prophylaxis: Protonix Q24h Glucose control: Insulin gtt Mobility: Bedrest Code Status: Full Family Communication: pt is  spanish speaking, daughter accompanied her to urgent care. [phone] [place] Augustad CarDarcus [phone]: ICU  Labs   CBC: Recent Labs  Lab 04/17/2019 1545 04/07/2019 1551  WBC 12.9*  --   NEUTROABS 10.1*  --   HGB 16.7* 17.3*  HCT 50.8* 51.0*  MCV 93.6  --   PLT 301  --     Basic Metabolic Panel: Recent Labs  Lab 04/12/2019 1545 04/14/2019 1551  NA 135 137  K 5.5* 5.4*  CL 98 104  CO2 22  --   GLUCOSE 262* 262*  BUN 21 30*  CREATININE 1.05* 1.00  CALCIUM 10.1  --    GFR: CrCl cannot be calculated (Unknown ideal weight.). Recent Labs  Lab 04/11/2019 1542 05/02/2019 1545 05/01/2019 1937  WBC  --  12.9*  --   LATICACIDVEN 4.0*  --  3.3*    Liver Function Tests: Recent Labs  Lab 04/05/2019 1545  AST 50*  ALT 20  ALKPHOS 78  BILITOT 1.7*  PROT 7.7  ALBUMIN 4.0   Recent Labs  Lab 04/20/2019 1545  LIPASE 16   No results for input(s): AMMONIA in the last 168 hours.  ABG    Component Value Date/Time   TCO2 25 04/22/2019 1551  Coagulation Profile: No results for input(s): INR, PROTIME in the last 168 hours.  Cardiac Enzymes: No results for input(s): CKTOTAL, CKMB, CKMBINDEX, TROPONINI in the last 168 hours.  HbA1C: Hgb A1c MFr Bld  Date/Time Value Ref Range Status  07/10/2018 01:04 PM 7.2 (H) <5.7 % of total Hgb Final    Comment:    For someone without known diabetes, a hemoglobin A1c value of 6.5% or greater indicates that they may have  diabetes and this should be confirmed with a follow-up  test. . For someone with known diabetes, a value <7% indicates  that their diabetes is well controlled and a value  greater than or equal to 7% indicates suboptimal  control. A1c targets should be individualized based on  duration of diabetes, age, comorbid conditions, and  other considerations. . Currently, no consensus exists regarding use of hemoglobin A1c for diagnosis of diabetes for children. Marland Kitchen   12/25/2017 10:49 AM 7.5 (H)  <5.7 % of total Hgb Final    Comment:    For someone without known diabetes, a hemoglobin A1c value of 6.5% or greater indicates that they may have  diabetes and this should be confirmed with a follow-up  test. . For someone with known diabetes, a value <7% indicates  that their diabetes is well controlled and a value  greater than or equal to 7% indicates suboptimal  control. A1c targets should be individualized based on  duration of diabetes, age, comorbid conditions, and  other considerations. . Currently, no consensus exists regarding use of hemoglobin A1c for diagnosis of diabetes for children. .     CBG: No results for input(s): GLUCAP in the last 168 hours.  Review of Systems:   Marland KitchenMarland KitchenReview of Systems  Unable to perform ROS: Intubated     Past Medical History  She,  has a past medical history of Diabetes mellitus without complication ([place]), Hemorrhoids, HTN (hypertension), Hyperlipemia, Microhematuria, Osteoarthritis, and PMR (polymyalgia rheumatica) ([place]).   Surgical History    Past Surgical History:  Procedure Laterality Date  . BREAST BIOPSY  2005   benign  . CATARACT EXTRACTION    . LAPAROSCOPIC HYSTERECTOMY     per Dr Toney Rakes, ?oophorectomy     Social History   reports that she has never smoked. She has never used smokeless tobacco. She reports that she does not drink alcohol or use drugs.   Family History   Her family history includes Coronary artery disease in her mother; Diabetes in an other family member. There is no history of Stroke, Hypertension, or Cancer.   Allergies No Known Allergies   Home Medications  Prior to Admission medications   Medication Sig Start Date End Date Taking? Authorizing Provider  aspirin 81 MG tablet Take 81 mg by mouth daily.     Yes [provider]  Calcium Carbonate-Vitamin D (CALCIUM 600 + D PO) Take 2 tablets by mouth daily.    Yes [provider]  carvedilol (COREG) 25 MG tablet Take 1 tablet  (25 mg total) by mouth 2 (two) times daily with a meal. 01/20/19  Yes Paz, Alda Berthold, MD  Cholecalciferol (VITAMIN D3) 5000 units TABS Take 4 tablets by mouth daily.   Yes [provider]  cloNIDine (CATAPRES) 0.3 MG tablet Take 1 tablet (0.3 mg total) by mouth 3 (three) times daily. 12/11/18  Yes Paz, Alda Berthold, MD  ezetimibe (ZETIA) 10 MG tablet Take 1 tablet (10 mg total) by mouth daily. 01/17/19  Yes Colon Branch, MD  fish oil-omega-3 fatty acids 1000 MG capsule Take 4 g by mouth daily. Reported on 01/18/2016   Yes [provider]  hydrochlorothiazide (HYDRODIURIL) 25 MG tablet Take 1 tablet (25 mg total) by mouth daily. 01/20/19  Yes Paz, Alda Berthold, MD  losartan (COZAAR) 100 MG tablet Take 1 tablet (100 mg total) by mouth daily. 11/04/18  Yes Colon Branch, MD  metFORMIN (GLUCOPHAGE) 1000 MG tablet Take 1 tablet (1,000 mg total) by mouth 2 (two) times daily with a meal. 10/04/18  Yes Paz, Alda Berthold, MD  Multiple Vitamin (MULTIVITAMIN PO) Take 1 tablet by mouth daily.    Yes [provider]  acetaminophen (TYLENOL) 650 MG CR tablet Take 650 mg by mouth every 8 (eight) hours as needed. Reported on 01/18/2016    [provider]  Blood Glucose Monitoring Suppl ([place]) w/Device KIT Check blood sugar once daily 12/24/17   Colon Branch, MD  Blood Pressure Monitoring (BLOOD PRESSURE MONITOR AUTOMAT) DEVI Check blood pressure daily as directed 07/30/17   Colon Branch, MD  estradiol (ESTRACE) 0.1 MG/GM vaginal cream APPLY A SMALL AMOUNT (0.5 GRAM) 2 TIMES WEEKLY 12/27/18   [provider]  glucose blood (ONETOUCH VERIO) test strip Check blood sugar once daily 12/24/17   Colon Branch, MD  ONE [facility] LANCETS MISC Check blood sugar once daily 12/24/17   Colon Branch, MD     I, Dr Seward Carol have personally reviewed patient's available data, including medical history, events of note, physical examination and test results as part of my evaluation. I have discussed with  other care providers such as pharmacist, RN and Elink.  In addition,  I personally evaluated patient The patient is critically ill with multiple organ systems failure and requires high complexity decision making for assessment and support, frequent evaluation and titration of therapies, application of advanced monitoring technologies and extensive interpretation of multiple databases.   Critical Care Time devoted to patient care services described in this note is  63 Minutes. This time reflects time of care of this signee Dr Seward Carol. This critical care time does not reflect procedure time, or teaching time or supervisory time but could involve care discussion time   CC TIME: 55  minutes  Dr. Seward Carol Pulmonary Critical Care Medicine  04/29/2019 10:42 PM   Critical care time: 55 mins

## 2019-04-21 NOTE — Consult Note (Signed)
[facility] Consult Note   Primary Physician:  Kathlene November, MD Primary Cardiologist:   None listed  Reason for Consultation:  Elevated Troponin  HPI:    **Patient is intubated.  History is documented by reviewing the electronic medical records.**  Alison Graves is an 85 year-old-female with a past medical history significant for essential hypertension, hyperlipidemia, type 2 diabetes mellitus, osteoarthritis, polymyalgia rheumatica, and chronic leg swelling who presented to an urgent care with complaints of epigastric discomfort.  No radiation of the pain.  No chest pain. No fevers or chills.  In the emergency department she was noted to be hypertensive, tachycardic, tachypneic and hypoxic.  Initially on room air her O2 saturation was in the 70s.  She was therefore placed on a nonrebreather mask.  Eventually she was intubated.  Covid test was negative.  Chest x-ray showed right midlung and right base opacities.  A CT angiogram was negative for a pulmonary embolism.  There was extensive bilateral multifocal airspace consolidation identified concerning for multifocal pneumonia.  The right lower lobe appeared to be primarily affected.  There was an incidental finding of three-vessel coronary artery calcifications.  The high-sensitivity troponins were as follows: 279, 553, 790, and 771.  BNP was 317.  The lactic acid was 4.0 and 3.3.  WBC 12.9.  The ECG revealed sinus tachycardia with poor R wave progression in the anterior leads, voltage criteria for LVH and repolarization abnormalities.    Home Medications Prior to Admission medications   Medication Sig Start Date End Date Taking? Authorizing Provider  aspirin 81 MG tablet Take 81 mg by mouth daily.     Yes [provider]  Calcium Carbonate-Vitamin D (CALCIUM 600 + D PO) Take 2 tablets by mouth daily.    Yes [provider]  carvedilol (COREG) 25 MG tablet Take 1 tablet (25 mg total) by mouth 2 (two) times daily with  a meal. 01/20/19  Yes Paz, Alda Berthold, MD  Cholecalciferol (VITAMIN D3) 5000 units TABS Take 4 tablets by mouth daily.   Yes [provider]  cloNIDine (CATAPRES) 0.3 MG tablet Take 1 tablet (0.3 mg total) by mouth 3 (three) times daily. 12/11/18  Yes Paz, Alda Berthold, MD  ezetimibe (ZETIA) 10 MG tablet Take 1 tablet (10 mg total) by mouth daily. 01/17/19  Yes Paz, Alda Berthold, MD  fish oil-omega-3 fatty acids 1000 MG capsule Take 4 g by mouth daily. Reported on 01/18/2016   Yes [provider]  hydrochlorothiazide (HYDRODIURIL) 25 MG tablet Take 1 tablet (25 mg total) by mouth daily. 01/20/19  Yes Paz, Alda Berthold, MD  losartan (COZAAR) 100 MG tablet Take 1 tablet (100 mg total) by mouth daily. 11/04/18  Yes Colon Branch, MD  metFORMIN (GLUCOPHAGE) 1000 MG tablet Take 1 tablet (1,000 mg total) by mouth 2 (two) times daily with a meal. 10/04/18  Yes Paz, Alda Berthold, MD  Multiple Vitamin (MULTIVITAMIN PO) Take 1 tablet by mouth daily.    Yes [provider]  acetaminophen (TYLENOL) 650 MG CR tablet Take 650 mg by mouth every 8 (eight) hours as needed. Reported on 01/18/2016    [provider]  Blood Glucose Monitoring Suppl ([place]) w/Device KIT Check blood sugar once daily 12/24/17   Colon Branch, MD  Blood Pressure Monitoring (BLOOD PRESSURE MONITOR AUTOMAT) DEVI Check blood pressure daily as directed 07/30/17   Colon Branch, MD  estradiol (ESTRACE) 0.1 MG/GM vaginal cream APPLY A SMALL AMOUNT (  0.5 GRAM) 2 TIMES WEEKLY 12/27/18   [provider]  glucose blood (ONETOUCH VERIO) test strip Check blood sugar once daily 12/24/17   Colon Branch, MD  ONE [facility] LANCETS MISC Check blood sugar once daily 12/24/17   Colon Branch, MD    Past Medical History: Past Medical History:  Diagnosis Date   Diabetes mellitus without complication ([place])    Hemorrhoids    HTN (hypertension)    Hyperlipemia    Microhematuria    s/p eval DR Tannenbaum 2004   Osteoarthritis    PMR  (polymyalgia rheumatica) ([facility])    ?    Past Surgical History: Past Surgical History:  Procedure Laterality Date   BREAST BIOPSY  2005   benign   CATARACT EXTRACTION     LAPAROSCOPIC HYSTERECTOMY     per Dr Toney Rakes, ?oophorectomy    Family History: Family History  Problem Relation Age of Onset   Coronary artery disease Mother        early in life    Diabetes Other        grandmother   Stroke Neg Hx    Hypertension Neg Hx    Cancer Neg Hx     Social History: Social History   Socioeconomic History   Marital status: Widowed    Spouse name: Not on file   Number of children: 3   Years of education: Not on file   Highest education level: Not on file  Occupational History   Occupation: retired   Scientist, product/process development strain: Not on file   Food insecurity    Worry: Not on file    Inability: Not on Lexicographer needs    Medical: Not on file    Non-medical: Not on file  Tobacco Use   Smoking status: Never Smoker   Smokeless tobacco: Never Used  Substance and Sexual Activity   Alcohol use: No   Drug use: No   Sexual activity: Not Currently    Birth control/protection: Surgical  Lifestyle   Physical activity    Days per week: Not on file    Minutes per session: Not on file   Stress: Not on file  Relationships   Social connections    Talks on phone: Not on file    Gets together: Not on file    Attends religious service: Not on file    Active member of club or organization: Not on file    Attends meetings of clubs or organizations: Not on file    Relationship status: Not on file  Other Topics Concern   Not on file  Social History Narrative   Widow,  Original from [place] , lives w/ 2 of her 3 daughters    Allergies:  No Known Allergies   Review of Systems: [y] = yes, _0  = no    General: Weight gain _1 ; Weight loss _2 ; Anorexia _3 ; Fatigue _4 ; Fever _5 ; Chills _6 ; Weakness _7    Cardiac: Chest  pain/pressure _8 ; Resting SOB [Y]; Exertional SOB _9 ; Orthopnea _10 ; Pedal Edema _11 ; Palpitations _12 ; Syncope _13 ; Presyncope _14 ; Paroxysmal nocturnal dyspnea_15    Pulmonary: Cough _16 ; Wheezing_17 ; Hemoptysis_18 ; Sputum _19 ; Snoring _20    GI: Vomiting_21 ; Dysphagia_22 ; Melena_23 ; Hematochezia _24 ; Heartburn_25 ; Abdominal pain [Y]; Constipation _26 ; Diarrhea _27 ; BRBPR _28    GU: Hematuria_29 ;  Dysuria _0 ; Nocturia_1    Vascular: Pain in legs with walking _2 ; Pain in feet with lying flat _3 ; Non-healing sores _4 ; Stroke _5 ; TIA _6 ; Slurred speech _7 ;   Neuro: Headaches_8 ; Vertigo_9 ; Seizures_10 ; Paresthesias_11 ;Blurred vision _12 ; Diplopia _13 ; Vision changes _14    Ortho/Skin: Arthritis _15 ; Joint pain _16 ; Muscle pain _17 ; Joint swelling _18 ; Back Pain _19 ; Rash _20    Psych: Depression_21 ; Anxiety_22    Heme: Bleeding problems _23 ; Clotting disorders _24 ; Anemia _25    Endocrine: Diabetes _26 ; Thyroid dysfunction_27      Objective:    Vital Signs:   Temp:  [97.7 F (36.5 C)] 97.7 F (36.5 C) (10/19 1608) Pulse Rate:  [98-124] 115 (10/19 2300) Resp:  [17-36] 21 (10/19 2300) BP: (99-175)/(67-102) 164/98 (10/19 2300) SpO2:  [90 %-97 %] 94 % (10/19 2300) FiO2 (%):  [92 %-100 %] 100 % (10/19 2300) Weight:  [59.4 kg] 59.4 kg (10/19 2000)    Weight change: Filed Weights   04/20/2019 2000  Weight: 59.4 kg    Intake/Output:   Intake/Output Summary (Last 24 hours) at 04/09/2019 2316 Last data filed at 04/23/2019 1832 Gross per 24 hour  Intake 100 ml  Output --  Net 100 ml      Physical Exam    General: Patient is intubated and sedated HEENT: normal Neck: supple. JVP . Carotids 2+ bilat; no bruits. No lymphadenopathy or thyromegaly appreciated. Cor: PMI nondisplaced.  Tachycardiac, no murmurs Lungs: Clear to auscultation anteriorly Abdomen: No bruits or masses. Extremities: no cyanosis, clubbing, rash, edema Neuro: Unable to assess Affect: Unable to  assess     Labs   Basic Metabolic Panel: Recent Labs  Lab 04/18/2019 1545 04/09/2019 1551 04/12/2019 2150  NA 135 137 135   141  K 5.5* 5.4* 5.0   3.9  CL 98 104 99  CO2 22  --  21*  GLUCOSE 262* 262* 189*  BUN 21 30* 24*  CREATININE 1.05* 1.00 1.11*  CALCIUM 10.1  --  8.9    Liver Function Tests: Recent Labs  Lab 04/06/2019 1545 04/30/2019 2150  AST 50* 52*  ALT 20 19  ALKPHOS 78 74  BILITOT 1.7* 1.6*  PROT 7.7 7.1  ALBUMIN 4.0 3.5   Recent Labs  Lab 04/18/2019 1545  LIPASE 16   No results for input(s): AMMONIA in the last 168 hours.  CBC: Recent Labs  Lab 04/03/2019 1545 04/08/2019 1551 04/19/2019 2150  WBC 12.9*  --   --   NEUTROABS 10.1*  --   --   HGB 16.7* 17.3* 17.0*  HCT 50.8* 51.0* 50.0*  MCV 93.6  --   --   PLT 301  --   --     Cardiac Enzymes: No results for input(s): CKTOTAL, CKMB, CKMBINDEX, TROPONINI in the last 168 hours.  BNP: BNP (last 3 results) Recent Labs    04/25/2019 1542  BNP 317.6*    ProBNP (last 3 results) No results for input(s): PROBNP in the last 8760 hours.   CBG: Recent Labs  Lab 04/16/2019 2258  GLUCAP 174*    Coagulation Studies: No results for input(s): LABPROT, INR in the last 72 hours.   Imaging   Ct Angio Chest Pe W And/or Wo Contrast  Result Date: 04/10/2019 CLINICAL DATA:  Abdominal pain. Oxygen desaturation. EXAM: CT ANGIOGRAPHY CHEST CT ABDOMEN AND PELVIS WITH CONTRAST TECHNIQUE: Multidetector  CT imaging of the chest was performed using the standard protocol during bolus administration of intravenous contrast. Multiplanar CT image reconstructions and MIPs were obtained to evaluate the vascular anatomy. Multidetector CT imaging of the abdomen and pelvis was performed using the standard protocol during bolus administration of intravenous contrast. CONTRAST:  124m OMNIPAQUE IOHEXOL 350 MG/ML SOLN COMPARISON:  None. FINDINGS: CTA CHEST FINDINGS Cardiovascular: The main pulmonary artery is patent. No central  obstructing pulmonary artery filling defects identified. Artifact from mixing of contrast with unopacified blood is noted within the central pulmonary arteries bilaterally. Reflux of contrast material from the right atrium into the IVC and hepatic veins compatible with passive venous congestion which may reflect right heart failure. Mild cardiac enlargement. No pericardial effusion. Three vessel coronary artery atherosclerotic calcifications. Mediastinum/Nodes: No enlarged mediastinal, hilar, or axillary lymph nodes. Thyroid gland, trachea, and esophagus demonstrate no significant findings. Lungs/Pleura: There are small bilateral pleural effusions. Diffuse bilateral upper and lower lobe interlobular septal thickening with areas of ground-glass attenuation are identified compatible with pulmonary edema. There is dense airspace consolidation involving the right lower lobe, right middle lobe and left lower lobe. Partial airspace consolidation of the right upper lobe and central left upper lobe also noted. Musculoskeletal: Multi level thoracic degenerative disc disease identified. No aggressive lytic or sclerotic bone lesions identified. Review of the MIP images confirms the above findings. CT ABDOMEN and PELVIS FINDINGS Hepatobiliary: No focal liver abnormality is seen. No gallstones, gallbladder wall thickening, or biliary dilatation. Calcification within the right hepatic lobe noted, likely benign Pancreas: Unremarkable. No pancreatic ductal dilatation or surrounding inflammatory changes. Spleen: Normal in size without focal abnormality. Adrenals/Urinary Tract: Normal appearance of the adrenal glands. The kidneys are unremarkable. No mass or hydronephrosis. Stomach/Bowel: Stomach is within normal limits. Appendix appears normal. No evidence of bowel wall thickening, distention, or inflammatory changes. Vascular/Lymphatic: Aortic atherosclerosis. No aneurysm identified. No abdominopelvic adenopathy Reproductive:  Status post hysterectomy. No adnexal masses. Other: No free fluid or fluid collections. Musculoskeletal: No acute or significant osseous findings. Multi level spondylosis within the lumbar spine identified. Anterolisthesis of L3 on L4 and L4 on L5 is noted and there is marked multilevel disc space narrowing and endplate spurring. Review of the MIP images confirms the above findings. IMPRESSION: 1. No evidence for acute pulmonary embolus. 2. Extensive, bilateral multifocal airspace consolidation identified concerning for multifocal pneumonia. Right lower lobe appears most severely affected. Underlying malignancy would be difficult to exclude and follow-up imaging to resolution is advise. If this does not improve with antibiotic therapy then further investigation with bronchoscopy may be considered to assess for underlying tumor. 3. Cardiac enlargement, bilateral pleural effusions and diffuse pulmonary edema compatible with CHF. Reflux of contrast from the right heart into the IVC and hepatic veins suggest right heart failure. 4. No acute findings within the abdomen or pelvis. 5. Three vessel coronary artery calcifications noted. 6. Lumbar spondylosis. Aortic Atherosclerosis (ICD10-I70.0). Electronically Signed   By: TKerby MoorsM.D.   On: 04/05/2019 20:55   Ct Abdomen Pelvis W Contrast  Result Date: 04/08/2019 CLINICAL DATA:  Abdominal pain. Oxygen desaturation. EXAM: CT ANGIOGRAPHY CHEST CT ABDOMEN AND PELVIS WITH CONTRAST TECHNIQUE: Multidetector CT imaging of the chest was performed using the standard protocol during bolus administration of intravenous contrast. Multiplanar CT image reconstructions and MIPs were obtained to evaluate the vascular anatomy. Multidetector CT imaging of the abdomen and pelvis was performed using the standard protocol during bolus administration of intravenous contrast. CONTRAST:  1066mOMNIPAQUE IOHEXOL 350  MG/ML SOLN COMPARISON:  None. FINDINGS: CTA CHEST FINDINGS  Cardiovascular: The main pulmonary artery is patent. No central obstructing pulmonary artery filling defects identified. Artifact from mixing of contrast with unopacified blood is noted within the central pulmonary arteries bilaterally. Reflux of contrast material from the right atrium into the IVC and hepatic veins compatible with passive venous congestion which may reflect right heart failure. Mild cardiac enlargement. No pericardial effusion. Three vessel coronary artery atherosclerotic calcifications. Mediastinum/Nodes: No enlarged mediastinal, hilar, or axillary lymph nodes. Thyroid gland, trachea, and esophagus demonstrate no significant findings. Lungs/Pleura: There are small bilateral pleural effusions. Diffuse bilateral upper and lower lobe interlobular septal thickening with areas of ground-glass attenuation are identified compatible with pulmonary edema. There is dense airspace consolidation involving the right lower lobe, right middle lobe and left lower lobe. Partial airspace consolidation of the right upper lobe and central left upper lobe also noted. Musculoskeletal: Multi level thoracic degenerative disc disease identified. No aggressive lytic or sclerotic bone lesions identified. Review of the MIP images confirms the above findings. CT ABDOMEN and PELVIS FINDINGS Hepatobiliary: No focal liver abnormality is seen. No gallstones, gallbladder wall thickening, or biliary dilatation. Calcification within the right hepatic lobe noted, likely benign Pancreas: Unremarkable. No pancreatic ductal dilatation or surrounding inflammatory changes. Spleen: Normal in size without focal abnormality. Adrenals/Urinary Tract: Normal appearance of the adrenal glands. The kidneys are unremarkable. No mass or hydronephrosis. Stomach/Bowel: Stomach is within normal limits. Appendix appears normal. No evidence of bowel wall thickening, distention, or inflammatory changes. Vascular/Lymphatic: Aortic atherosclerosis. No  aneurysm identified. No abdominopelvic adenopathy Reproductive: Status post hysterectomy. No adnexal masses. Other: No free fluid or fluid collections. Musculoskeletal: No acute or significant osseous findings. Multi level spondylosis within the lumbar spine identified. Anterolisthesis of L3 on L4 and L4 on L5 is noted and there is marked multilevel disc space narrowing and endplate spurring. Review of the MIP images confirms the above findings. IMPRESSION: 1. No evidence for acute pulmonary embolus. 2. Extensive, bilateral multifocal airspace consolidation identified concerning for multifocal pneumonia. Right lower lobe appears most severely affected. Underlying malignancy would be difficult to exclude and follow-up imaging to resolution is advise. If this does not improve with antibiotic therapy then further investigation with bronchoscopy may be considered to assess for underlying tumor. 3. Cardiac enlargement, bilateral pleural effusions and diffuse pulmonary edema compatible with CHF. Reflux of contrast from the right heart into the IVC and hepatic veins suggest right heart failure. 4. No acute findings within the abdomen or pelvis. 5. Three vessel coronary artery calcifications noted. 6. Lumbar spondylosis. Aortic Atherosclerosis (ICD10-I70.0). Electronically Signed   By: Kerby Moors M.D.   On: 04/09/2019 20:55   Dg Chest Port 1 View  Result Date: 05/01/2019 CLINICAL DATA:  Intubation EXAM: PORTABLE CHEST 1 VIEW COMPARISON:  04/09/2019, 3:48 p.m. FINDINGS: Interval placement of endotracheal tube, tip position over the lower trachea, approximately 2.5 cm above the carina. Esophagogastric tube with tip and side port below the diaphragm. Interval increase in extensive bilateral heterogeneous and interstitial airspace opacity with probable small pleural effusions, most dense and conspicuous in the right midlung. Cardiomegaly. IMPRESSION: 1. Interval placement of endotracheal tube, tip position over the  lower trachea, approximately 2.5 cm above the carina. 2.  Esophagogastric tube with tip and side port below the diaphragm. 3. Interval increase in extensive bilateral heterogeneous and interstitial airspace opacity with probable small pleural effusions, most dense and conspicuous in the right midlung. Findings are consistent with worsened edema or infection. 4.  Cardiomegaly. Electronically Signed   By: Eddie Candle M.D.   On: 04/14/2019 21:20   Dg Chest Portable 1 View  Result Date: 04/19/2019 CLINICAL DATA:  Abdominal pain and shortness of breath EXAM: PORTABLE CHEST 1 VIEW COMPARISON:  02/01/2016 FINDINGS: Mild cardiac enlargement. Aortic atherosclerosis. New airspace disease is identified within the right midlung and right base. Mild diffuse pulmonary edema is also noted. The visualized osseous structures are unremarkable. IMPRESSION: 1. New airspace disease within the right midlung and right base worrisome for pneumonia. 2. Mild pulmonary edema. Electronically Signed   By: Kerby Moors M.D.   On: 04/13/2019 16:00      Medications:     Current Medications:  [START ON 04/22/2019] Chlorhexidine Gluconate Cloth  6 each Topical Daily   gabapentin  100 mg Per Tube Q8H   pantoprazole (PROTONIX) IV  40 mg Intravenous QHS     Infusions:  [START ON 04/22/2019] ceFEPime (MAXIPIME) IV     fentaNYL infusion INTRAVENOUS 25 mcg/hr (04/05/2019 2212)   heparin 700 Units/hr (04/05/2019 2147)   insulin     metronidazole 500 mg (04/13/2019 2216)   propofol (DIPRIVAN) infusion Stopped (04/24/2019 2209)   vancomycin     [START ON 04/23/2019] vancomycin         Assessment/Plan   1.  Elevated troponin The patient presents with epigastric discomfort.  For acute respiratory failure she was intubated.  A CT angiogram was negative for PE.  Chest imaging is suggestive of a pneumonia.  Abnormal cardiac biomarkers likely secondary to demand ischemia.  The ECG is consistent with LVH with  repolarization abnormalities.  -IV Unfractionated heparin -Trend cardiac biomarkers -Serial ECGs -Aspirin suppository daily -Transthoracic echocardiogram to evaluate LV function -Resume antihypertensive regimen when appropriate -High-dose statins   Meade Maw, MD  04/22/2019, 11:16 PM  Cardiology Overnight Team Please contact [facility] for night-coverage after hours (4p -7a ) and weekends on [email or web address]

## 2019-04-21 NOTE — ED Provider Notes (Signed)
[facility] EMERGENCY DEPARTMENT Provider Note   CSN: [id number] Arrival date & time: 04/23/2019  1528     History   Chief Complaint Chief Complaint  Patient presents with   Abdominal Pain   Shortness of Breath   Chest Pain    HPI Alison Graves is a 85 y.o. female.     HPI   85 year old female with abdominal pain.  Language barrier.  Patient is primarily Spanish-speaking.  Her daughter is at bedside providing translation.  She has been having some mild upper abdominal pain intermittently for the past couple days but nothing too severe.  This morning she woke up feeling relatively fine.  Around 10 AM she began having pain in epigastric region which is progressively worsened.  Is now constant and severe.  Worse with movement.  Denies radiation.  No respiratory complaints.  No fevers or chills.  She was initially brought to urgent care by her daughter.  She was expeditiously referred to the emergency room after she presented there very tachypneic, hypoxic and in obvious distress.  Past Medical History:  Diagnosis Date   Diabetes mellitus without complication ([place])    Hemorrhoids    HTN (hypertension)    Hyperlipemia    Microhematuria    s/p eval DR Tannenbaum 2004   Osteoarthritis    PMR (polymyalgia rheumatica) (HCC)    ?    Patient Active Problem List   Diagnosis Date Noted   Diabetes mellitus, type II ([place]) 01/25/2017   Chondrocalcinosis 04/24/2016   Osteoarthritis of patellofemoral joints of both knees 04/24/2016   Osteoarthritis of both hands 04/24/2016   Osteoarthritis of feet, bilateral 04/24/2016   PCP NOTES >>>>>>>>>>>>>>>>>>>>>>>>> 09/26/2015   Edema 02/17/2015   Anxiety 11/16/2011   Annual physical exam 10/04/2010   Osteopenia 10/04/2010   MENOPAUSE-RELATED VASOMOTOR SYMPTOMS 04/19/2009   Osteoarthritis of both knees 12/16/2008   Polymyalgia rheumatica ([place]) 01/10/2008   Dyslipidemia 11/29/2006   HTN  (hypertension) 11/20/2006   HEMORRHOIDS 07/25/2006    Past Surgical History:  Procedure Laterality Date   BREAST BIOPSY  2005   benign   CATARACT EXTRACTION     LAPAROSCOPIC HYSTERECTOMY     per Dr Toney Rakes, ?oophorectomy     OB History   No obstetric history on file.      Home Medications    Prior to Admission medications   Medication Sig Start Date End Date Taking? Authorizing Provider  acetaminophen (TYLENOL) 650 MG CR tablet Take 650 mg by mouth every 8 (eight) hours as needed. Reported on 01/18/2016    [provider]  aspirin 81 MG tablet Take 81 mg by mouth daily.      [provider]  Blood Glucose Monitoring Suppl ([place]) w/Device KIT Check blood sugar once daily 12/24/17   Colon Branch, MD  Blood Pressure Monitoring (BLOOD PRESSURE MONITOR AUTOMAT) DEVI Check blood pressure daily as directed 07/30/17   Colon Branch, MD  Calcium Carbonate-Vitamin D (CALCIUM 600 + D PO) Take 3 tablets by mouth daily.     [provider]  carvedilol (COREG) 25 MG tablet Take 1 tablet (25 mg total) by mouth 2 (two) times daily with a meal. 01/20/19   Colon Branch, MD  Cholecalciferol (VITAMIN D3) 5000 units TABS Take 4 tablets by mouth daily.    [provider]  cloNIDine (CATAPRES) 0.3 MG tablet Take 1 tablet (0.3 mg total) by mouth 3 (three) times daily. 12/11/18   Paz,  Alda Berthold, MD  estradiol (ESTRACE) 0.1 MG/GM vaginal cream APPLY A SMALL AMOUNT (0.5 GRAM) 2 TIMES WEEKLY 12/27/18   [provider]  ezetimibe (ZETIA) 10 MG tablet Take 1 tablet (10 mg total) by mouth daily. 01/17/19   Colon Branch, MD  fish oil-omega-3 fatty acids 1000 MG capsule Take 4 g by mouth daily. Reported on 01/18/2016    [provider]  glucose blood (ONETOUCH VERIO) test strip Check blood sugar once daily 12/24/17   Colon Branch, MD  hydrochlorothiazide (HYDRODIURIL) 25 MG tablet Take 1 tablet (25 mg total) by mouth daily. 01/20/19   Colon Branch, MD    losartan (COZAAR) 100 MG tablet Take 1 tablet (100 mg total) by mouth daily. 11/04/18   Colon Branch, MD  metFORMIN (GLUCOPHAGE) 1000 MG tablet Take 1 tablet (1,000 mg total) by mouth 2 (two) times daily with a meal. 10/04/18   Colon Branch, MD  Multiple Vitamin (MULTIVITAMIN PO) Take 1 tablet by mouth daily.     [provider]  NON FORMULARY Antifungal solution for toenails from [facility], faxed 01/24/2019 HS-CMA    [provider]  ONE TOUCH LANCETS MISC Check blood sugar once daily 12/24/17   Colon Branch, MD    Family History Family History  Problem Relation Age of Onset   Coronary artery disease Mother        early in life    Diabetes Other        grandmother   Stroke Neg Hx    Hypertension Neg Hx    Cancer Neg Hx     Social History Social History   Tobacco Use   Smoking status: Never Smoker   Smokeless tobacco: Never Used  Substance Use Topics   Alcohol use: No   Drug use: No     Allergies   Patient has no known allergies.   Review of Systems Review of Systems  All systems reviewed and negative, other than as noted in HPI.  Physical Exam Updated Vital Signs Pulse (!) 115    Temp 97.7 F (36.5 C) (Rectal)    Resp (!) 23    SpO2 97%   Physical Exam Vitals signs and nursing note reviewed.  Constitutional:      General: She is in acute distress.     Appearance: She is well-developed. She is ill-appearing.     Comments: Pale. Uncomfortable/ill appearing.   HENT:     Head: Normocephalic and atraumatic.  Eyes:     General:        Right eye: No discharge.        Left eye: No discharge.     Conjunctiva/sclera: Conjunctivae normal.  Neck:     Musculoskeletal: Neck supple.  Cardiovascular:     Rate and Rhythm: Normal rate and regular rhythm.     Heart sounds: Normal heart sounds. No murmur. No friction rub. No gallop.   Pulmonary:     Effort: Respiratory distress present.     Comments: Tachypnea. Splinting. R sided  rhonchi. Abdominal:     Palpations: Abdomen is soft.     Tenderness: There is abdominal tenderness.     Comments: TTP epigastrium and RUQ  Musculoskeletal:        General: No tenderness.  Skin:    General: Skin is warm and dry.  Neurological:     Mental Status: She is alert.      ED Treatments / Results  Labs (all labs ordered  are listed, but only abnormal results are displayed) Labs Reviewed  CBC WITH DIFFERENTIAL/PLATELET - Abnormal; Notable for the following components:      Result Value   WBC 12.9 (*)    RBC 5.43 (*)    Hemoglobin 16.7 (*)    HCT 50.8 (*)    Neutro Abs 10.1 (*)    All other components within normal limits  COMPREHENSIVE METABOLIC PANEL - Abnormal; Notable for the following components:   Potassium 5.5 (*)    Glucose, Bld 262 (*)    Creatinine, Ser 1.05 (*)    AST 50 (*)    Total Bilirubin 1.7 (*)    GFR calc non Af Amer 48 (*)    GFR calc Af Amer 56 (*)    All other components within normal limits  LACTIC ACID, PLASMA - Abnormal; Notable for the following components:   Lactic Acid, Venous 4.0 (*)    All other components within normal limits  LACTIC ACID, PLASMA - Abnormal; Notable for the following components:   Lactic Acid, Venous 3.3 (*)    All other components within normal limits  I-STAT CHEM 8, ED - Abnormal; Notable for the following components:   Potassium 5.4 (*)    BUN 30 (*)    Glucose, Bld 262 (*)    Calcium, Ion 1.13 (*)    Hemoglobin 17.3 (*)    HCT 51.0 (*)    All other components within normal limits  TROPONIN I (HIGH SENSITIVITY) - Abnormal; Notable for the following components:   Troponin I (High Sensitivity) 279 (*)    All other components within normal limits  TROPONIN I (HIGH SENSITIVITY) - Abnormal; Notable for the following components:   Troponin I (High Sensitivity) 553 (*)    All other components within normal limits  SARS CORONAVIRUS 2 BY RT PCR (HOSPITAL ORDER, [facility])  CULTURE, BLOOD  (ROUTINE X 2)  CULTURE, BLOOD (ROUTINE X 2)  LIPASE, BLOOD  I-STAT ARTERIAL BLOOD GAS, ED  TYPE AND SCREEN  ABO/RH  TROPONIN I (HIGH SENSITIVITY)    EKG EKG Interpretation  Date/Time:  Monday April 21 2019 15:43:59 EDT Ventricular Rate:  111 PR Interval:    QRS Duration: 99 QT Interval:  360 QTC Calculation: 490 R Axis:   -43 Text Interpretation:  Sinus tachycardia Left anterior fascicular block LVH with secondary repolarization abnormality Confirmed by Virgel Manifold [phone]) on 04/14/2019 3:57:16 PM   Radiology Ct Angio Chest Pe W And/or Wo Contrast  Result Date: 04/04/2019 CLINICAL DATA:  Abdominal pain. Oxygen desaturation. EXAM: CT ANGIOGRAPHY CHEST CT ABDOMEN AND PELVIS WITH CONTRAST TECHNIQUE: Multidetector CT imaging of the chest was performed using the standard protocol during bolus administration of intravenous contrast. Multiplanar CT image reconstructions and MIPs were obtained to evaluate the vascular anatomy. Multidetector CT imaging of the abdomen and pelvis was performed using the standard protocol during bolus administration of intravenous contrast. CONTRAST:  137m OMNIPAQUE IOHEXOL 350 MG/ML SOLN COMPARISON:  None. FINDINGS: CTA CHEST FINDINGS Cardiovascular: The main pulmonary artery is patent. No central obstructing pulmonary artery filling defects identified. Artifact from mixing of contrast with unopacified blood is noted within the central pulmonary arteries bilaterally. Reflux of contrast material from the right atrium into the IVC and hepatic veins compatible with passive venous congestion which may reflect right heart failure. Mild cardiac enlargement. No pericardial effusion. Three vessel coronary artery atherosclerotic calcifications. Mediastinum/Nodes: No enlarged mediastinal, hilar, or axillary lymph nodes. Thyroid gland, trachea, and esophagus demonstrate  no significant findings. Lungs/Pleura: There are small bilateral pleural effusions. Diffuse bilateral  upper and lower lobe interlobular septal thickening with areas of ground-glass attenuation are identified compatible with pulmonary edema. There is dense airspace consolidation involving the right lower lobe, right middle lobe and left lower lobe. Partial airspace consolidation of the right upper lobe and central left upper lobe also noted. Musculoskeletal: Multi level thoracic degenerative disc disease identified. No aggressive lytic or sclerotic bone lesions identified. Review of the MIP images confirms the above findings. CT ABDOMEN and PELVIS FINDINGS Hepatobiliary: No focal liver abnormality is seen. No gallstones, gallbladder wall thickening, or biliary dilatation. Calcification within the right hepatic lobe noted, likely benign Pancreas: Unremarkable. No pancreatic ductal dilatation or surrounding inflammatory changes. Spleen: Normal in size without focal abnormality. Adrenals/Urinary Tract: Normal appearance of the adrenal glands. The kidneys are unremarkable. No mass or hydronephrosis. Stomach/Bowel: Stomach is within normal limits. Appendix appears normal. No evidence of bowel wall thickening, distention, or inflammatory changes. Vascular/Lymphatic: Aortic atherosclerosis. No aneurysm identified. No abdominopelvic adenopathy Reproductive: Status post hysterectomy. No adnexal masses. Other: No free fluid or fluid collections. Musculoskeletal: No acute or significant osseous findings. Multi level spondylosis within the lumbar spine identified. Anterolisthesis of L3 on L4 and L4 on L5 is noted and there is marked multilevel disc space narrowing and endplate spurring. Review of the MIP images confirms the above findings. IMPRESSION: 1. No evidence for acute pulmonary embolus. 2. Extensive, bilateral multifocal airspace consolidation identified concerning for multifocal pneumonia. Right lower lobe appears most severely affected. Underlying malignancy would be difficult to exclude and follow-up imaging to  resolution is advise. If this does not improve with antibiotic therapy then further investigation with bronchoscopy may be considered to assess for underlying tumor. 3. Cardiac enlargement, bilateral pleural effusions and diffuse pulmonary edema compatible with CHF. Reflux of contrast from the right heart into the IVC and hepatic veins suggest right heart failure. 4. No acute findings within the abdomen or pelvis. 5. Three vessel coronary artery calcifications noted. 6. Lumbar spondylosis. Aortic Atherosclerosis (ICD10-I70.0). Electronically Signed   By: Kerby Moors M.D.   On: 04/24/2019 20:55   Ct Abdomen Pelvis W Contrast  Result Date: 04/10/2019 CLINICAL DATA:  Abdominal pain. Oxygen desaturation. EXAM: CT ANGIOGRAPHY CHEST CT ABDOMEN AND PELVIS WITH CONTRAST TECHNIQUE: Multidetector CT imaging of the chest was performed using the standard protocol during bolus administration of intravenous contrast. Multiplanar CT image reconstructions and MIPs were obtained to evaluate the vascular anatomy. Multidetector CT imaging of the abdomen and pelvis was performed using the standard protocol during bolus administration of intravenous contrast. CONTRAST:  164m OMNIPAQUE IOHEXOL 350 MG/ML SOLN COMPARISON:  None. FINDINGS: CTA CHEST FINDINGS Cardiovascular: The main pulmonary artery is patent. No central obstructing pulmonary artery filling defects identified. Artifact from mixing of contrast with unopacified blood is noted within the central pulmonary arteries bilaterally. Reflux of contrast material from the right atrium into the IVC and hepatic veins compatible with passive venous congestion which may reflect right heart failure. Mild cardiac enlargement. No pericardial effusion. Three vessel coronary artery atherosclerotic calcifications. Mediastinum/Nodes: No enlarged mediastinal, hilar, or axillary lymph nodes. Thyroid gland, trachea, and esophagus demonstrate no significant findings. Lungs/Pleura: There are  small bilateral pleural effusions. Diffuse bilateral upper and lower lobe interlobular septal thickening with areas of ground-glass attenuation are identified compatible with pulmonary edema. There is dense airspace consolidation involving the right lower lobe, right middle lobe and left lower lobe. Partial airspace consolidation of the right upper lobe  and central left upper lobe also noted. Musculoskeletal: Multi level thoracic degenerative disc disease identified. No aggressive lytic or sclerotic bone lesions identified. Review of the MIP images confirms the above findings. CT ABDOMEN and PELVIS FINDINGS Hepatobiliary: No focal liver abnormality is seen. No gallstones, gallbladder wall thickening, or biliary dilatation. Calcification within the right hepatic lobe noted, likely benign Pancreas: Unremarkable. No pancreatic ductal dilatation or surrounding inflammatory changes. Spleen: Normal in size without focal abnormality. Adrenals/Urinary Tract: Normal appearance of the adrenal glands. The kidneys are unremarkable. No mass or hydronephrosis. Stomach/Bowel: Stomach is within normal limits. Appendix appears normal. No evidence of bowel wall thickening, distention, or inflammatory changes. Vascular/Lymphatic: Aortic atherosclerosis. No aneurysm identified. No abdominopelvic adenopathy Reproductive: Status post hysterectomy. No adnexal masses. Other: No free fluid or fluid collections. Musculoskeletal: No acute or significant osseous findings. Multi level spondylosis within the lumbar spine identified. Anterolisthesis of L3 on L4 and L4 on L5 is noted and there is marked multilevel disc space narrowing and endplate spurring. Review of the MIP images confirms the above findings. IMPRESSION: 1. No evidence for acute pulmonary embolus. 2. Extensive, bilateral multifocal airspace consolidation identified concerning for multifocal pneumonia. Right lower lobe appears most severely affected. Underlying malignancy would be  difficult to exclude and follow-up imaging to resolution is advise. If this does not improve with antibiotic therapy then further investigation with bronchoscopy may be considered to assess for underlying tumor. 3. Cardiac enlargement, bilateral pleural effusions and diffuse pulmonary edema compatible with CHF. Reflux of contrast from the right heart into the IVC and hepatic veins suggest right heart failure. 4. No acute findings within the abdomen or pelvis. 5. Three vessel coronary artery calcifications noted. 6. Lumbar spondylosis. Aortic Atherosclerosis (ICD10-I70.0). Electronically Signed   By: Kerby Moors M.D.   On: 04/05/2019 20:55   Dg Chest Port 1 View  Result Date: 04/20/2019 CLINICAL DATA:  Intubation EXAM: PORTABLE CHEST 1 VIEW COMPARISON:  04/30/2019, 3:48 p.m. FINDINGS: Interval placement of endotracheal tube, tip position over the lower trachea, approximately 2.5 cm above the carina. Esophagogastric tube with tip and side port below the diaphragm. Interval increase in extensive bilateral heterogeneous and interstitial airspace opacity with probable small pleural effusions, most dense and conspicuous in the right midlung. Cardiomegaly. IMPRESSION: 1. Interval placement of endotracheal tube, tip position over the lower trachea, approximately 2.5 cm above the carina. 2.  Esophagogastric tube with tip and side port below the diaphragm. 3. Interval increase in extensive bilateral heterogeneous and interstitial airspace opacity with probable small pleural effusions, most dense and conspicuous in the right midlung. Findings are consistent with worsened edema or infection. 4.  Cardiomegaly. Electronically Signed   By: Eddie Candle M.D.   On: 04/16/2019 21:20   Dg Chest Portable 1 View  Result Date: 04/29/2019 CLINICAL DATA:  Abdominal pain and shortness of breath EXAM: PORTABLE CHEST 1 VIEW COMPARISON:  02/01/2016 FINDINGS: Mild cardiac enlargement. Aortic atherosclerosis. New airspace disease is  identified within the right midlung and right base. Mild diffuse pulmonary edema is also noted. The visualized osseous structures are unremarkable. IMPRESSION: 1. New airspace disease within the right midlung and right base worrisome for pneumonia. 2. Mild pulmonary edema. Electronically Signed   By: Kerby Moors M.D.   On: 04/08/2019 16:00    Procedures NG placement  Date/Time: 04/09/2019 9:00 PM Performed by: Virgel Manifold, MD Authorized by: Virgel Manifold, MD  Consent: The procedure was performed in an emergent situation. Patient identity confirmed: provided demographic data and arm band  Local anesthesia used: no  Anesthesia: Local anesthesia used: no  Sedation: Patient sedated: no  Patient tolerance: patient tolerated the procedure well with no immediate complications Comments: Appropriately positioned on imaging    (including critical care time)  CRITICAL CARE Performed by: Virgel Manifold Total critical care time: 75 minutes Critical care time was exclusive of separately billable procedures and treating other patients. Critical care was necessary to treat or prevent imminent or life-threatening deterioration. Critical care was time spent personally by me on the following activities: development of treatment plan with patient and/or surrogate as well as nursing, discussions with consultants, evaluation of patient's response to treatment, examination of patient, obtaining history from patient or surrogate, ordering and performing treatments and interventions, ordering and review of laboratory studies, ordering and review of radiographic studies, pulse oximetry and re-evaluation of patient's condition.  INTUBATION Performed by: Virgel Manifold  Required items: required blood products, implants, devices, and special equipment available Patient identity confirmed: provided demographic data and hospital-assigned identification number Time out: Immediately prior to procedure a  "time out" was called to verify the correct patient, procedure, equipment, support staff and site/side marked as required.  Indications: respiratory failure, airway protection  Intubation method: Glidescope Laryngoscopy   Preoxygenation: NRB  Sedatives: Etomidate Paralytic: Rocuronium  Tube Size: 7.5 cuffed  Post-procedure assessment: chest rise and ETCO2 monitor Breath sounds: equal and absent over the epigastrium Tube secured with: ETT holder Chest x-ray interpreted by radiologist and me.  Chest x-ray findings: endotracheal tube in appropriate position  Patient tolerated the procedure well with no immediate complications.    Medications Ordered in ED Medications  fentaNYL (SUBLIMAZE) injection 50 mcg (50 mcg Intravenous Given 04/14/2019 1554)  ceFEPIme (MAXIPIME) 2 g in sodium chloride 0.9 % 100 mL IVPB (has no administration in time range)  sodium chloride flush (NS) 0.9 % injection 3 mL (3 mLs Intravenous Given 04/03/2019 1548)     Initial Impression / Assessment and Plan / ED Course  I have reviewed the triage vital signs and the nursing notes.  Pertinent labs & imaging results that were available during my care of the patient were reviewed by me and considered in my medical decision making (see chart for details).     A 85 year old female with epigastric pain.  Hypoxic on room air to the 70s.  She was initially placed on a nonrebreather.  Oxygen saturations improving into the high 90s and were trying to wean it down.  She is mildly tender in epigastrium to her right abdomen.  No rebound or guarding.  Afebrile.  Blood pressure initially okay.  Chest x-ray noted.  Right-sided infiltrate. No obvious pneumoperitoneum. Cefepime ordered. RLL process could be felt as upper abdominal pain potentially. Would also explain hypoxemia. Given her degree of distress and her abdominal pain, will CT.  Troponin elevated. Certainly could be ACS, but clinical picture not clear. Ischemic  appearing changes on EKG. Could be demand ischemia from hypoxemia. Will defer ASA/heparin at this time per chance there is intraabdominal surgical process.   Still not completely clear to me if infectious versus primary cardiac process. Imaging as above. COVID negative. Pt returned from CT hypoxic on NRB. Less responsive. Had to be intubated. ASA/heparin. Cardiology consulted. Discussed with CCM. Daughter has been at bedside through ED and continually updated.   Bufford Lope was evaluated in Emergency Department on 04/13/2019 for the symptoms described in the history of present illness. She was evaluated in the context of the global COVID-19 pandemic, which  necessitated consideration that the patient might be at risk for infection with the SARS-CoV-2 virus that causes COVID-19. Institutional protocols and algorithms that pertain to the evaluation of patients at risk for COVID-19 are in a state of rapid change based on information released by regulatory bodies including the CDC and federal and state organizations. These policies and algorithms were followed during the patient's care in the ED.  Final Clinical Impressions(s) / ED Diagnoses   Final diagnoses:  Acute respiratory failure with hypoxia ([place])  Pneumonia of both lungs due to infectious organism, unspecified part of lung  Acute pulmonary edema [facility])    ED Discharge Orders    None       Virgel Manifold, MD 04/22/19 1609

## 2019-04-21 NOTE — ED Notes (Signed)
Patient intubated by EDP with RT , ETT #7.5 taped at 23 cm. , OGT intact , foley catheter intact , IV sites unremarkable , Propofol drip infusing , daughter at bedside .

## 2019-04-21 NOTE — Progress Notes (Signed)
Patient transported from ED31 to 1Q82 with no complications.

## 2019-04-21 NOTE — ED Triage Notes (Signed)
Per EMS- pt went to [facility] for eval of abdominal pain. Pt was 76% on room air. Reports pain in epigastric region. Pt was pale on EMS arrival 89% on simple face mask. Received flu vaccine Friday. Arrives on NRB. Speaks spanish. CBG 333

## 2019-04-21 NOTE — ED Notes (Signed)
Date and time results received: 18-May-2019 1703  (use smartphrase ".now" to insert current time)  Test: lactic Critical Value:4.0 Name of Provider Notified: [email or web address]  Orders Received? Or Actions Taken?: .

## 2019-04-22 ENCOUNTER — Inpatient Hospital Stay (HOSPITAL_COMMUNITY): Payer: Medicare Other

## 2019-04-22 DIAGNOSIS — E7800 Pure hypercholesterolemia, unspecified: Secondary | ICD-10-CM

## 2019-04-22 DIAGNOSIS — J9601 Acute respiratory failure with hypoxia: Secondary | ICD-10-CM | POA: Diagnosis not present

## 2019-04-22 DIAGNOSIS — I249 Acute ischemic heart disease, unspecified: Secondary | ICD-10-CM | POA: Diagnosis not present

## 2019-04-22 DIAGNOSIS — G9340 Encephalopathy, unspecified: Secondary | ICD-10-CM | POA: Diagnosis not present

## 2019-04-22 DIAGNOSIS — R579 Shock, unspecified: Secondary | ICD-10-CM

## 2019-04-22 DIAGNOSIS — R778 Other specified abnormalities of plasma proteins: Secondary | ICD-10-CM | POA: Diagnosis not present

## 2019-04-22 DIAGNOSIS — I5021 Acute systolic (congestive) heart failure: Secondary | ICD-10-CM | POA: Diagnosis not present

## 2019-04-22 LAB — CBC
HCT: 46.9 % — ABNORMAL HIGH (ref 36.0–46.0)
Hemoglobin: 15.5 g/dL — ABNORMAL HIGH (ref 12.0–15.0)
MCH: 30.5 pg (ref 26.0–34.0)
MCHC: 33 g/dL (ref 30.0–36.0)
MCV: 92.1 fL (ref 80.0–100.0)
Platelets: 266 10*3/uL (ref 150–400)
RBC: 5.09 MIL/uL (ref 3.87–5.11)
RDW: 13.8 % (ref 11.5–15.5)
WBC: 12.8 10*3/uL — ABNORMAL HIGH (ref 4.0–10.5)
nRBC: 0 % (ref 0.0–0.2)

## 2019-04-22 LAB — BASIC METABOLIC PANEL
Anion gap: 14 (ref 5–15)
BUN: 22 mg/dL (ref 8–23)
CO2: 20 mmol/L — ABNORMAL LOW (ref 22–32)
Calcium: 8.6 mg/dL — ABNORMAL LOW (ref 8.9–10.3)
Chloride: 109 mmol/L (ref 98–111)
Creatinine, Ser: 0.94 mg/dL (ref 0.44–1.00)
GFR calc Af Amer: >60 mL/min (ref >60)
GFR calc non Af Amer: 55 mL/min — ABNORMAL LOW (ref >60)
Glucose, Bld: 113 mg/dL — ABNORMAL HIGH (ref 70–99)
Potassium: 3.8 mmol/L (ref 3.5–5.1)
Sodium: 143 mmol/L (ref 135–145)

## 2019-04-22 LAB — HEMOGLOBIN A1C
Hgb A1c MFr Bld: 7.4 % — ABNORMAL HIGH (ref 4.8–5.6)
Mean Plasma Glucose: 165.68 mg/dL

## 2019-04-22 LAB — COMPREHENSIVE METABOLIC PANEL
ALT: 19 U/L (ref 0–44)
AST: 34 U/L (ref 15–41)
Albumin: 2.8 g/dL — ABNORMAL LOW (ref 3.5–5.0)
Alkaline Phosphatase: 63 U/L (ref 38–126)
Anion gap: 13 (ref 5–15)
BUN: 24 mg/dL — ABNORMAL HIGH (ref 8–23)
CO2: 18 mmol/L — ABNORMAL LOW (ref 22–32)
Calcium: 9.7 mg/dL (ref 8.9–10.3)
Chloride: 109 mmol/L (ref 98–111)
Creatinine, Ser: 1.05 mg/dL — ABNORMAL HIGH (ref 0.44–1.00)
GFR calc Af Amer: 56 mL/min — ABNORMAL LOW (ref >60)
GFR calc non Af Amer: 48 mL/min — ABNORMAL LOW (ref >60)
Glucose, Bld: 159 mg/dL — ABNORMAL HIGH (ref 70–99)
Potassium: 3.3 mmol/L — ABNORMAL LOW (ref 3.5–5.1)
Sodium: 140 mmol/L (ref 135–145)
Total Bilirubin: 1 mg/dL (ref 0.3–1.2)
Total Protein: 6.2 g/dL — ABNORMAL LOW (ref 6.5–8.1)

## 2019-04-22 LAB — LIPID PANEL
Cholesterol: 239 mg/dL — ABNORMAL HIGH (ref 0–200)
HDL: 68 mg/dL (ref >40)
LDL Cholesterol: 153 mg/dL — ABNORMAL HIGH (ref 0–99)
Total CHOL/HDL Ratio: 3.5 RATIO
Triglycerides: 92 mg/dL (ref <150)
VLDL: 18 mg/dL (ref 0–40)

## 2019-04-22 LAB — POCT I-STAT 7, (LYTES, BLD GAS, ICA,H+H)
Acid-base deficit: 2 mmol/L (ref 0.0–2.0)
Bicarbonate: 22.1 mmol/L (ref 20.0–28.0)
Calcium, Ion: 1.19 mmol/L (ref 1.15–1.40)
HCT: 45 % (ref 36.0–46.0)
Hemoglobin: 15.3 g/dL — ABNORMAL HIGH (ref 12.0–15.0)
O2 Saturation: 96 %
Patient temperature: 97.7
Potassium: 3.2 mmol/L — ABNORMAL LOW (ref 3.5–5.1)
Sodium: 141 mmol/L (ref 135–145)
TCO2: 23 mmol/L (ref 22–32)
pCO2 arterial: 36.2 mmHg (ref 32.0–48.0)
pH, Arterial: 7.393 (ref 7.350–7.450)
pO2, Arterial: 82 mmHg — ABNORMAL LOW (ref 83.0–108.0)

## 2019-04-22 LAB — GLUCOSE, CAPILLARY
Glucose-Capillary: 117 mg/dL — ABNORMAL HIGH (ref 70–99)
Glucose-Capillary: 121 mg/dL — ABNORMAL HIGH (ref 70–99)
Glucose-Capillary: 123 mg/dL — ABNORMAL HIGH (ref 70–99)
Glucose-Capillary: 132 mg/dL — ABNORMAL HIGH (ref 70–99)
Glucose-Capillary: 136 mg/dL — ABNORMAL HIGH (ref 70–99)
Glucose-Capillary: 140 mg/dL — ABNORMAL HIGH (ref 70–99)

## 2019-04-22 LAB — MRSA PCR SCREENING: MRSA by PCR: NEGATIVE

## 2019-04-22 LAB — MAGNESIUM
Magnesium: 1.4 mg/dL — ABNORMAL LOW (ref 1.7–2.4)
Magnesium: 2.1 mg/dL (ref 1.7–2.4)
Magnesium: 2.8 mg/dL — ABNORMAL HIGH (ref 1.7–2.4)

## 2019-04-22 LAB — LACTIC ACID, PLASMA
Lactic Acid, Venous: 3.2 mmol/L (ref 0.5–1.9)
Lactic Acid, Venous: 2.8 mmol/L (ref 0.5–1.9)

## 2019-04-22 LAB — HEPARIN LEVEL (UNFRACTIONATED)
Heparin Unfractionated: 0.48 IU/mL (ref 0.30–0.70)
Heparin Unfractionated: 0.51 IU/mL (ref 0.30–0.70)

## 2019-04-22 MED ORDER — ATORVASTATIN CALCIUM 40 MG PO TABS
40.0000 mg | ORAL_TABLET | Freq: Every day | ORAL | Status: DC
  Administered 2019-04-22 – 2019-04-25 (×5): 40 mg via ORAL
  Filled 2019-04-22 (×5): qty 1

## 2019-04-22 MED ORDER — ASPIRIN 81 MG PO CHEW
81.0000 mg | CHEWABLE_TABLET | Freq: Every day | ORAL | Status: DC
  Administered 2019-04-22 – 2019-04-26 (×5): 81 mg
  Filled 2019-04-22 (×5): qty 1

## 2019-04-22 MED ORDER — POTASSIUM CHLORIDE 20 MEQ/15ML (10%) PO SOLN
40.0000 meq | Freq: Three times a day (TID) | ORAL | Status: AC
  Administered 2019-04-22: 40 meq
  Filled 2019-04-22: qty 30

## 2019-04-22 MED ORDER — MIDAZOLAM HCL 2 MG/2ML IJ SOLN
1.0000 mg | INTRAMUSCULAR | Status: DC | PRN
  Administered 2019-04-22 – 2019-04-23 (×7): 2 mg via INTRAVENOUS
  Filled 2019-04-22 (×8): qty 2

## 2019-04-22 MED ORDER — POTASSIUM CHLORIDE 20 MEQ/15ML (10%) PO SOLN
20.0000 meq | ORAL | Status: AC
  Administered 2019-04-22 (×2): 20 meq
  Filled 2019-04-22 (×2): qty 15

## 2019-04-22 MED ORDER — SODIUM CHLORIDE 0.9 % IV SOLN
6.0000 g | Freq: Once | INTRAVENOUS | Status: AC
  Administered 2019-04-22: 6 g via INTRAVENOUS
  Filled 2019-04-22: qty 12

## 2019-04-22 MED ORDER — PHENYLEPHRINE HCL-NACL 10-0.9 MG/250ML-% IV SOLN
0.0000 ug/min | INTRAVENOUS | Status: DC
  Administered 2019-04-22: 02:00:00 50 ug/min via INTRAVENOUS
  Administered 2019-04-22: 06:00:00 25 ug/min via INTRAVENOUS
  Filled 2019-04-22 (×2): qty 250

## 2019-04-22 MED ORDER — VITAL HIGH PROTEIN PO LIQD
1000.0000 mL | ORAL | Status: DC
  Administered 2019-04-22 – 2019-04-25 (×4): 1000 mL
  Filled 2019-04-22: qty 1000

## 2019-04-22 NOTE — Progress Notes (Signed)
[facility] ADULT ICU REPLACEMENT PROTOCOL FOR AM LAB REPLACEMENT ONLY  The patient does apply for the [facility] Adult ICU Electrolyte Replacment Protocol based on the criteria listed below:   1. Is GFR >/= 40 ml/min? Yes.    Patient's GFR today is 48 2. Is urine output >/= 0.5 ml/kg/hr for the last 6 hours? Yes.   Patient's UOP is 1.9 ml/kg/hr 3. Is BUN < 60 mg/dL? Yes.    Patient's BUN today is 24 4. Abnormal electrolyte(s): K+ 3.3 mag 1.4 5. Ordered repletion with: protocol 6. If a panic level lab has been reported, has the CCM MD in charge been notified? Yes.  .   Physician:  Dr. Radene Knee, Talbot Grumbling 04/22/2019 4:58 AM

## 2019-04-22 NOTE — Progress Notes (Signed)
Daughter, Angela Nevin, updated bedside, she notes that mom has been having trouble swallowing lately, often coughs with food and drink, and sometimes falls asleep while eating.  Provider made aware, orders to follow.

## 2019-04-22 NOTE — Progress Notes (Signed)
ANTICOAGULATION CONSULT NOTE   Pharmacy Consult for Heparin Indication: chest pain/ACS  No Known Allergies  Patient Measurements: Height: 4\' 10"  (147.3 cm) Weight: 131 lb (59.4 kg) IBW/kg (Calculated) : 40.9 Heparin Dosing Weight: TBW  Vital Signs: Temp: 98.2 F (36.8 C) (10/20 0400) Temp Source: Axillary (10/20 0400) BP: 106/88 (10/20 0500) Pulse Rate: 77 (10/20 0500)  Labs: Recent Labs    04/20/2019 1545 04/19/2019 1551 05/01/2019 1937 04/14/2019 2120 04/19/2019 2150 04/22/19 0012 04/22/19 0215 04/22/19 0223 04/22/19 0448  HGB 16.7* 17.3*  --   --  17.0* 15.5*  --  15.3*  --   HCT 50.8* 51.0*  --   --  50.0* 46.9*  --  45.0  --   PLT 301  --   --   --   --  266  --   --   --   HEPARINUNFRC  --   --   --   --   --   --   --   --  0.48  CREATININE 1.05* 1.00  --   --  1.11*  --  1.05*  --   --   TROPONINIHS 279*  --  553* 790* 771*  --   --   --   --     Estimated Creatinine Clearance: 29.9 mL/min (A) (by C-G formula based on SCr of 1.05 mg/dL (H)).  Assessment: Pt is a 85 y/o female who presented with abdominal pain. Pt was very tachypneic, hypoxic, and in distress, which required intubation. Due to troponin elevation, there is concern for ACS. Pharmacy consulted to dose heparin.  Initial heparin level 0.48 units/ml  Goal of Therapy:  Heparin level 0.3-0.7 units/ml Monitor platelets by anticoagulation protocol: Yes   Plan:  Continue heparin at 700 units/hr Check heparin level later today to confirm Monitor for signs/symptoms of bleeding  Thanks for allowing pharmacy to be a part of this patient's care.  Excell Seltzer, PharmD Clinical Pharmacist 04/22/2019,6:00 AM

## 2019-04-22 NOTE — Progress Notes (Signed)
Progress Note  Patient Name: Alison Graves Date of Encounter: 04/22/2019  Primary Cardiologist: Skeet Latch, MD (new)  Subjective   Unable to obtain.  Intubated and sedated.   Inpatient Medications    Scheduled Meds:  atorvastatin  40 mg Oral q1800   chlorhexidine gluconate (MEDLINE KIT)  15 mL Mouth Rinse BID   Chlorhexidine Gluconate Cloth  6 each Topical Daily   gabapentin  100 mg Per Tube Q8H   insulin aspart  0-9 Units Subcutaneous Q4H   mouth rinse  15 mL Mouth Rinse 10 times per day   pantoprazole (PROTONIX) IV  40 mg Intravenous QHS   Continuous Infusions:  sodium chloride Stopped (04/22/19 0505)   ceFEPime (MAXIPIME) IV Stopped (04/22/19 0442)   fentaNYL infusion INTRAVENOUS 400 mcg/hr (04/22/19 0924)   heparin 700 Units/hr (04/22/19 0600)   magnesium sulfate LVP 250-500 ml 43.7 mL/hr at 04/22/19 0600   phenylephrine (NEO-SYNEPHRINE) Adult infusion 25 mcg/min (04/22/19 0600)   [START ON 04/23/2019] vancomycin     PRN Meds: sodium chloride, fentaNYL, fentaNYL (SUBLIMAZE) injection, ipratropium-albuterol, midazolam, midazolam   Vital Signs    Vitals:   04/22/19 0407 04/22/19 0500 04/22/19 0600 04/22/19 0800  BP: 100/73 106/88 95/65   Pulse: (!) 103 77 75   Resp: (!) 28 (!) 25 (!) 24   Temp:    97.7 F (36.5 C)  TempSrc:    Oral  SpO2: 99% 98% 97% 98%  Weight:  62.1 kg    Height:        Intake/Output Summary (Last 24 hours) at 04/22/2019 0932 Last data filed at 04/22/2019 0600 Gross per 24 hour  Intake 1125.94 ml  Output 1150 ml  Net -24.06 ml   Last 3 Weights 04/22/2019 04/13/2019 07/05/2018  Weight (lbs) 136 lb 14.5 oz 131 lb 128 lb  Weight (kg) 62.1 kg 59.421 kg 58.06 kg      Telemetry    Sinus rhythm.  PVCs - Personally Reviewed  ECG    04/12/2019: sinus tachycardia.  Rate 111 bpm.  LAFB.  LVH with repolarization abnormalities. - Personally Reviewed  Physical Exam   VS:  BP 95/65    Pulse 75    Temp 97.7 F (36.5  C) (Oral)    Resp (!) 24    Ht 4' 10"  (1.473 m)    Wt 62.1 kg    SpO2 98%    BMI 28.61 kg/m  , BMI Body mass index is 28.61 kg/m. GENERAL:  Critically ill-appearing.  Intubated and sedated. HEENT: Pupils equal round and reactive, fundi not visualized, oral mucosa unremarkable NECK:  No jugular venous distention, waveform within normal limits, carotid upstroke brisk and symmetric, no bruits LUNGS:  Vented breath sounds on anterior exam HEART:  RRR.  PMI not displaced or sustained,S1 and S2 within normal limits, no S3, no S4, no clicks, no rubs, no murmurs ABD:  Flat, positive bowel sounds normal in frequency in pitch, no bruits, no rebound, no guarding, no midline pulsatile mass, no hepatomegaly, no splenomegaly EXT:  Warm.  No edema SKIN:  No rashes no nodules NEURO:  Unable to assess.  Intubated and sedated.  PSYCH:  Unable to assess.  Intubated and sedated.   Labs    High Sensitivity Troponin:   Recent Labs  Lab 04/04/2019 1545 04/16/2019 1937 04/09/2019 2120 04/07/2019 2150  TROPONINIHS 279* 553* 790* 771*      Chemistry Recent Labs  Lab 04/10/2019 1545  04/28/2019 2150 04/22/19 0215 04/22/19 3295 04/22/19 1884  NA 135   < > 135   141 140 141 143  K 5.5*   < > 5.0   3.9 3.3* 3.2* 3.8  CL 98   < > 99 109  --  109  CO2 22  --  21* 18*  --  20*  GLUCOSE 262*   < > 189* 159*  --  113*  BUN 21   < > 24* 24*  --  22  CREATININE 1.05*   < > 1.11* 1.05*  --  0.94  CALCIUM 10.1  --  8.9 9.7  --  8.6*  PROT 7.7  --  7.1 6.2*  --   --   ALBUMIN 4.0  --  3.5 2.8*  --   --   AST 50*  --  52* 34  --   --   ALT 20  --  19 19  --   --   ALKPHOS 78  --  74 63  --   --   BILITOT 1.7*  --  1.6* 1.0  --   --   GFRNONAA 48*  --  45* 48*  --  55*  GFRAA 56*  --  52* 56*  --  >60  ANIONGAP 15  --  15 13  --  14   < > = values in this interval not displayed.     Hematology Recent Labs  Lab 04/08/2019 1545  04/05/2019 2150 04/22/19 0012 04/22/19 0223  WBC 12.9*  --   --  12.8*  --   RBC  5.43*  --   --  5.09  --   HGB 16.7*   < > 17.0* 15.5* 15.3*  HCT 50.8*   < > 50.0* 46.9* 45.0  MCV 93.6  --   --  92.1  --   MCH 30.8  --   --  30.5  --   MCHC 32.9  --   --  33.0  --   RDW 13.7  --   --  13.8  --   PLT 301  --   --  266  --    < > = values in this interval not displayed.    BNP Recent Labs  Lab 04/18/2019 1542  BNP 317.6*     DDimer No results for input(s): DDIMER in the last 168 hours.   Radiology    Ct Angio Chest Pe W And/or Wo Contrast  Result Date: 04/27/2019 CLINICAL DATA:  Abdominal pain. Oxygen desaturation. EXAM: CT ANGIOGRAPHY CHEST CT ABDOMEN AND PELVIS WITH CONTRAST TECHNIQUE: Multidetector CT imaging of the chest was performed using the standard protocol during bolus administration of intravenous contrast. Multiplanar CT image reconstructions and MIPs were obtained to evaluate the vascular anatomy. Multidetector CT imaging of the abdomen and pelvis was performed using the standard protocol during bolus administration of intravenous contrast. CONTRAST:  180m OMNIPAQUE IOHEXOL 350 MG/ML SOLN COMPARISON:  None. FINDINGS: CTA CHEST FINDINGS Cardiovascular: The main pulmonary artery is patent. No central obstructing pulmonary artery filling defects identified. Artifact from mixing of contrast with unopacified blood is noted within the central pulmonary arteries bilaterally. Reflux of contrast material from the right atrium into the IVC and hepatic veins compatible with passive venous congestion which may reflect right heart failure. Mild cardiac enlargement. No pericardial effusion. Three vessel coronary artery atherosclerotic calcifications. Mediastinum/Nodes: No enlarged mediastinal, hilar, or axillary lymph nodes. Thyroid gland, trachea, and esophagus demonstrate no significant findings. Lungs/Pleura: There are small bilateral pleural effusions. Diffuse bilateral  upper and lower lobe interlobular septal thickening with areas of ground-glass attenuation are  identified compatible with pulmonary edema. There is dense airspace consolidation involving the right lower lobe, right middle lobe and left lower lobe. Partial airspace consolidation of the right upper lobe and central left upper lobe also noted. Musculoskeletal: Multi level thoracic degenerative disc disease identified. No aggressive lytic or sclerotic bone lesions identified. Review of the MIP images confirms the above findings. CT ABDOMEN and PELVIS FINDINGS Hepatobiliary: No focal liver abnormality is seen. No gallstones, gallbladder wall thickening, or biliary dilatation. Calcification within the right hepatic lobe noted, likely benign Pancreas: Unremarkable. No pancreatic ductal dilatation or surrounding inflammatory changes. Spleen: Normal in size without focal abnormality. Adrenals/Urinary Tract: Normal appearance of the adrenal glands. The kidneys are unremarkable. No mass or hydronephrosis. Stomach/Bowel: Stomach is within normal limits. Appendix appears normal. No evidence of bowel wall thickening, distention, or inflammatory changes. Vascular/Lymphatic: Aortic atherosclerosis. No aneurysm identified. No abdominopelvic adenopathy Reproductive: Status post hysterectomy. No adnexal masses. Other: No free fluid or fluid collections. Musculoskeletal: No acute or significant osseous findings. Multi level spondylosis within the lumbar spine identified. Anterolisthesis of L3 on L4 and L4 on L5 is noted and there is marked multilevel disc space narrowing and endplate spurring. Review of the MIP images confirms the above findings. IMPRESSION: 1. No evidence for acute pulmonary embolus. 2. Extensive, bilateral multifocal airspace consolidation identified concerning for multifocal pneumonia. Right lower lobe appears most severely affected. Underlying malignancy would be difficult to exclude and follow-up imaging to resolution is advise. If this does not improve with antibiotic therapy then further investigation  with bronchoscopy may be considered to assess for underlying tumor. 3. Cardiac enlargement, bilateral pleural effusions and diffuse pulmonary edema compatible with CHF. Reflux of contrast from the right heart into the IVC and hepatic veins suggest right heart failure. 4. No acute findings within the abdomen or pelvis. 5. Three vessel coronary artery calcifications noted. 6. Lumbar spondylosis. Aortic Atherosclerosis (ICD10-I70.0). Electronically Signed   By: Kerby Moors M.D.   On: 04/16/2019 20:55   Ct Abdomen Pelvis W Contrast  Result Date: 04/08/2019 CLINICAL DATA:  Abdominal pain. Oxygen desaturation. EXAM: CT ANGIOGRAPHY CHEST CT ABDOMEN AND PELVIS WITH CONTRAST TECHNIQUE: Multidetector CT imaging of the chest was performed using the standard protocol during bolus administration of intravenous contrast. Multiplanar CT image reconstructions and MIPs were obtained to evaluate the vascular anatomy. Multidetector CT imaging of the abdomen and pelvis was performed using the standard protocol during bolus administration of intravenous contrast. CONTRAST:  123m OMNIPAQUE IOHEXOL 350 MG/ML SOLN COMPARISON:  None. FINDINGS: CTA CHEST FINDINGS Cardiovascular: The main pulmonary artery is patent. No central obstructing pulmonary artery filling defects identified. Artifact from mixing of contrast with unopacified blood is noted within the central pulmonary arteries bilaterally. Reflux of contrast material from the right atrium into the IVC and hepatic veins compatible with passive venous congestion which may reflect right heart failure. Mild cardiac enlargement. No pericardial effusion. Three vessel coronary artery atherosclerotic calcifications. Mediastinum/Nodes: No enlarged mediastinal, hilar, or axillary lymph nodes. Thyroid gland, trachea, and esophagus demonstrate no significant findings. Lungs/Pleura: There are small bilateral pleural effusions. Diffuse bilateral upper and lower lobe interlobular septal  thickening with areas of ground-glass attenuation are identified compatible with pulmonary edema. There is dense airspace consolidation involving the right lower lobe, right middle lobe and left lower lobe. Partial airspace consolidation of the right upper lobe and central left upper lobe also noted. Musculoskeletal: Multi level thoracic degenerative  disc disease identified. No aggressive lytic or sclerotic bone lesions identified. Review of the MIP images confirms the above findings. CT ABDOMEN and PELVIS FINDINGS Hepatobiliary: No focal liver abnormality is seen. No gallstones, gallbladder wall thickening, or biliary dilatation. Calcification within the right hepatic lobe noted, likely benign Pancreas: Unremarkable. No pancreatic ductal dilatation or surrounding inflammatory changes. Spleen: Normal in size without focal abnormality. Adrenals/Urinary Tract: Normal appearance of the adrenal glands. The kidneys are unremarkable. No mass or hydronephrosis. Stomach/Bowel: Stomach is within normal limits. Appendix appears normal. No evidence of bowel wall thickening, distention, or inflammatory changes. Vascular/Lymphatic: Aortic atherosclerosis. No aneurysm identified. No abdominopelvic adenopathy Reproductive: Status post hysterectomy. No adnexal masses. Other: No free fluid or fluid collections. Musculoskeletal: No acute or significant osseous findings. Multi level spondylosis within the lumbar spine identified. Anterolisthesis of L3 on L4 and L4 on L5 is noted and there is marked multilevel disc space narrowing and endplate spurring. Review of the MIP images confirms the above findings. IMPRESSION: 1. No evidence for acute pulmonary embolus. 2. Extensive, bilateral multifocal airspace consolidation identified concerning for multifocal pneumonia. Right lower lobe appears most severely affected. Underlying malignancy would be difficult to exclude and follow-up imaging to resolution is advise. If this does not improve  with antibiotic therapy then further investigation with bronchoscopy may be considered to assess for underlying tumor. 3. Cardiac enlargement, bilateral pleural effusions and diffuse pulmonary edema compatible with CHF. Reflux of contrast from the right heart into the IVC and hepatic veins suggest right heart failure. 4. No acute findings within the abdomen or pelvis. 5. Three vessel coronary artery calcifications noted. 6. Lumbar spondylosis. Aortic Atherosclerosis (ICD10-I70.0). Electronically Signed   By: Kerby Moors M.D.   On: 04/29/2019 20:55   Dg Chest Port 1 View  Result Date: 04/19/2019 CLINICAL DATA:  Intubation EXAM: PORTABLE CHEST 1 VIEW COMPARISON:  04/08/2019, 3:48 p.m. FINDINGS: Interval placement of endotracheal tube, tip position over the lower trachea, approximately 2.5 cm above the carina. Esophagogastric tube with tip and side port below the diaphragm. Interval increase in extensive bilateral heterogeneous and interstitial airspace opacity with probable small pleural effusions, most dense and conspicuous in the right midlung. Cardiomegaly. IMPRESSION: 1. Interval placement of endotracheal tube, tip position over the lower trachea, approximately 2.5 cm above the carina. 2.  Esophagogastric tube with tip and side port below the diaphragm. 3. Interval increase in extensive bilateral heterogeneous and interstitial airspace opacity with probable small pleural effusions, most dense and conspicuous in the right midlung. Findings are consistent with worsened edema or infection. 4.  Cardiomegaly. Electronically Signed   By: Eddie Candle M.D.   On: 05/02/2019 21:20   Dg Chest Portable 1 View  Result Date: 04/24/2019 CLINICAL DATA:  Abdominal pain and shortness of breath EXAM: PORTABLE CHEST 1 VIEW COMPARISON:  02/01/2016 FINDINGS: Mild cardiac enlargement. Aortic atherosclerosis. New airspace disease is identified within the right midlung and right base. Mild diffuse pulmonary edema is also  noted. The visualized osseous structures are unremarkable. IMPRESSION: 1. New airspace disease within the right midlung and right base worrisome for pneumonia. 2. Mild pulmonary edema. Electronically Signed   By: Kerby Moors M.D.   On: 04/04/2019 16:00    Cardiac Studies   Echo pending  Patient Profile     85 y.o. female with hypertension, hyperlipidemia, diabetes, polymyalgia rheumatica, and chronic lower extremity edema admitted with epigastric abdominal pain and hypoxic respiratory failure.Marland Kitchen  She was found to have multifocal pneumonia and elevated troponin.  Assessment &  Plan    # Elevated troponin: High-sensitivity troponin has increased from 279 to a peak of 790.  She did not have any chest pain but did complain of epigastric discomfort on admission. EKG showed LVH with repolarization abnormalities but was without acute ischemic changes.  She was incidentally noted to have coronary calcification on chest CT.  This is likely related to demand ischemia from her pneumonia.  However agree with heparin for 48 hours.  Consider an ischemic evaluation once more stable.  Resume her carvedilol when blood pressure allows.  Continue aspirin.  Echo is pending.  # Hyperlipidemia: LDL 153 this admission.  Atorvastatin 53m started this admission.  She will need lipids and a CMP checked in 6 to 8 weeks.  # CAP: # Hypoxic respiratory failure: On vent and antibiotics per critical care.  # Septic shock: On phenyephrine at 10.  Time spent: 30 minutes-Greater than 50% of this time was spent in counseling, explanation of diagnosis, planning of further management, and coordination of care.       For questions or updates, please contact CFort WashakiePlease consult [email or web address] for contact info under        Signed, TSkeet Latch MD  04/22/2019, 9:32 AM

## 2019-04-22 NOTE — Progress Notes (Signed)
[facility] Progress Note Patient Name: JILLISA HARRIS DOB: 1931-11-13 MRN: [id number]   Date of Service  04/22/2019  HPI/Events of Note  Agitation - Request for soft bilateral wrist restraints.   eICU Interventions  Will order: 1. Soft bilateral wrist restraints.      Intervention Category Major Interventions: Delirium, psychosis, severe agitation - evaluation and management  Sommer,Steven Eugene 04/22/2019, 1:44 AM

## 2019-04-22 NOTE — Progress Notes (Signed)
..   NAME:  Alison Graves, MRN:  [id number], DOB:  1932-05-17, LOS: 1 ADMISSION DATE:  04/25/2019, CONSULTATION DATE:  04/13/2019 REFERRING MD:  Juleen China MD, CHIEF COMPLAINT:  SOB and Abdominal Pain   Brief History   85 yr old F w/ PMHx sig for DM and HTN presented on 10/19 to urgent care with her daughter for SOB  History of present illness   ( History obtained from review of EMR, account of other providers and patient's daughter)  13 yr old F w/ PMHx significant for NIDDM  (on Metformin), HTN, presents to [facility] from Urgent care. Her initial complaint was abdominal pain in the epigastric location. Per EMD pt was pale in appearance and O2 sat 89% on simple face mask. Her Blood glucose was 333 mg/dl. Patiient's daughter provided translation and pt described her abdominal pain as intermittent and starting over the past few days.  Around 10 am on 10/19 the pain began and the patient stated that it became progressively worse with time.  It was constant and severe during her evaluation in the ED.  She denied radiation and stated that the pain was worse with movement.  Per EDP documentation pt initially improved with non-rebreather. Physical exam revealed no rebound or guardiung and pt was afebrile. CXR performed and then pt sent for CT.   Pt returned from CT hypoxic and in distress: O2 sat 78% and she was started on NRB at 15 L. Pt was in obvious distress.  Evaluated by EDP and intubated for acute resp failure with hypoxia.  PCCM consulted for admission.   Past Medical History  .Marland Kitchen Active Ambulatory Problems    Diagnosis Date Noted  . Dyslipidemia 11/29/2006  . HTN (hypertension) 11/20/2006  . HEMORRHOIDS 07/25/2006  . MENOPAUSE-RELATED VASOMOTOR SYMPTOMS 04/19/2009  . Osteoarthritis of both knees 12/16/2008  . Polymyalgia rheumatica (HCC) 01/10/2008  . Encounter for intubation 10/04/2010  . Osteopenia 10/04/2010  . Anxiety 11/16/2011  . Edema 02/17/2015  . PCP NOTES >>>>>>>>>>>>>>>>>>>>>>>>>  09/26/2015  . Chondrocalcinosis 04/24/2016  . Osteoarthritis of patellofemoral joints of both knees 04/24/2016  . Osteoarthritis of both hands 04/24/2016  . Osteoarthritis of feet, bilateral 04/24/2016  . Diabetes mellitus, type II (HCC) 01/25/2017  . Increased anion gap metabolic acidosis   . Respiratory acidosis   . Multilobar lung infiltrate    Resolved Ambulatory Problems    Diagnosis Date Noted  . WOUND, HAND 04/21/2010  . CHEST PAIN, ATYPICAL 08/26/2010  . HTN (hypertension)   . Bronchitis 07/12/2012   Past Medical History:  Diagnosis Date  . Diabetes mellitus without complication (HCC)   . Hemorrhoids   . Hyperlipemia   . Microhematuria   . Osteoarthritis   . PMR (polymyalgia rheumatica) (HCC)    Significant Hospital Events   Endotracheally intubated  Consults:  04/28/2019>>PCCM  Procedures:  04/23/2019>>Endotracheal Intubation  Significant Diagnostic Tests:  CT CHEST ABDOMEN & PELVIS:  Micro Data:  04/27/2019>>>RVP pending 04/18/2019>>>Trach culture pending 04/04/2019>>>>SARSCOV2 negative  Antimicrobials:  04/17/2019>>Cefepime 2g IV x 1 dose given in ED  Cefepime 10/19>>> Vancomycin 10/19>>> Flagyl 10/19>>>10/20  Subjective: FiO2 and PEEP improved overnight No events overnight Remains on pressors overnight  Objective   Blood pressure 95/65, pulse 75, temperature 97.7 F (36.5 C), temperature source Oral, resp. rate (!) 24, height  (1.473 m), weight 62.1 kg, SpO2 98 %.    Vent Mode: PRVC FiO2 (%):  [50 %-100 %] 50 % Set Rate:  [20 bmp-24 bmp] 24 bmp Vt Set:  [330  mL] 330 mL PEEP:  [8 cmH20] 8 cmH20 Plateau Pressure:  [16 cmH20-24 cmH20] 16 cmH20   Intake/Output Summary (Last 24 hours) at 04/22/2019 0915 Last data filed at 04/22/2019 0600 Gross per 24 hour  Intake 1125.94 ml  Output 1150 ml  Net -24.06 ml   Filed Weights   04/24/2019 2000 04/22/19 0500  Weight: 59.4 kg 62.1 kg    Examination: General: Chronically ill appearing  elderly female, NAD HENT: [place]/AT, PERRL, EOM-I and MMM, ETT in place Lungs: Bibasilar crackles R>L Cardiovascular: RRR, Nl S1/S2 and -M/R/G Abdomen: Soft, NT, ND and +BS Extremities: No acute deformity. No edema.  Neuro: Spontaneously awake, but appears comfortable. RASS 0  I reviewed CXR myself, RLL infiltrate more than left and ETT is in a good position  Resolved Hospital Problem list     Assessment & Plan:  1. Acute Respiratory Failure w/ hypoxia Endotracheally intubated No evidence of acute PE CT chest shows bilateral multifocal airspace consolidation esp in RLL Concern for post obstructive pneumonia vs aspiration ABG 7.282/50/76/23 Plan: Maintain MV at current Decrease FiO2 to 50 and PEEP to 8 and maintain for today CXR and ABG in AM Continue cefepime and vanc D/C flagyl  F/u on culture Will need repeat CT of the chest after completion of the course of abx and if a mass is present then will need bronchoscopy.  2. Elevated troponin EKG shows prolonged QT, LAFB, and LVH ( prev EKG in 2014 shows LVH w/ prolonged QT) Epigastric pain in Diabetic female>>suspicious for ACS Plan: Cardio consult appreciated Continue heparin drip until cards decides Trend Trops and EKG  3. NIDDM Previously on Metformin Pt BG >300 on presentation with AG Metabolic Acidosis + ketones on UA Plan: Continue to hold metformin Insulin drip as ordered  Betahydroxybutyrate 0.45  4. HTN Urgency On Clonidine, Coreg, Losartan and HCTZ Highest BP in ED>>175/1600mmHg Plan: Restart Clonidine at 0.1mg  via OGT Goal BP <140/90 mmHg  5. CHF No previous ECHO On CTAP evidence of Reflux of contrast material from the right atrium into the IVC and hepatic veins compatible with passive venous congestion. Plan: BNP pending Hold diureses while on low dose pressors 2D ECHO to assess LVEF per cards F/u Cardiology's recommendations  6. CAD H/o Dyslipidemia Three vessel coronary artery atherosclerotic  calcifications seen on CTA Pt is on Omega 3 and Ezetimbe on review of home meds On daily ASA 81mg  Given this info increased risk for ACS Plan: Check Lipid panel  Hold off weaning for today.  Discussed with PCCM-NP.  Stable on afternoon rounds, start Tf, weaning for AM, daughter udpated by bedside RN with my instruction.  AM labs placed.  Best practice:  Diet: NPO Pain/Anxiety/Delirium protocol (if indicated): per protocol Fentanyl with intermittent versed d/c propofol VAP protocol (if indicated): yes DVT prophylaxis: on Heparin gtt for ACS GI prophylaxis: Protonix Q24h Glucose control: Insulin gtt Mobility: Bedrest Code Status: Full Family Communication: pt is spanish speaking, daughter accompanied her to urgent care. [phone] is Lorna FewRuth Morales and Ronda FairlyCarla Clayton [phone] Disposition: ICU  Labs   CBC: Recent Labs  Lab 04/13/2019 1545 04/13/2019 1551 04/06/2019 2150 04/22/19 0012 04/22/19 0223  WBC 12.9*  --   --  12.8*  --   NEUTROABS 10.1*  --   --   --   --   HGB 16.7* 17.3* 17.0* 15.5* 15.3*  HCT 50.8* 51.0* 50.0* 46.9* 45.0  MCV 93.6  --   --  92.1  --   PLT 301  --   --  266  --     Basic Metabolic Panel: Recent Labs  Lab 04/25/2019 1545 04/15/2019 1551 04/08/2019 2150 04/22/19 0215 04/22/19 0223 04/22/19 0653  NA 135 137 135  141 140 141 143  K 5.5* 5.4* 5.0  3.9 3.3* 3.2* 3.8  CL 98 104 99 109  --  109  CO2 22  --  21* 18*  --  20*  GLUCOSE 262* 262* 189* 159*  --  113*  BUN 21 30* 24* 24*  --  22  CREATININE 1.05* 1.00 1.11* 1.05*  --  0.94  CALCIUM 10.1  --  8.9 9.7  --  8.6*  MG  --   --   --  1.4*  --  2.1   GFR: Estimated Creatinine Clearance: 34.1 mL/min (by C-G formula based on SCr of 0.94 mg/dL). Recent Labs  Lab 04/09/2019 1542 04/17/2019 1545 04/19/2019 1937 04/22/19 0012 04/22/19 0448 04/22/19 0653  WBC  --  12.9*  --  12.8*  --   --   LATICACIDVEN 4.0*  --  3.3*  --  3.2* 2.8*    Liver Function Tests: Recent Labs  Lab 04/12/2019 1545  04/28/2019 2150 04/22/19 0215  AST 50* 52* 34  ALT 20 19 19   ALKPHOS 78 74 63  BILITOT 1.7* 1.6* 1.0  PROT 7.7 7.1 6.2*  ALBUMIN 4.0 3.5 2.8*   Recent Labs  Lab 04/09/2019 1545  LIPASE 16   No results for input(s): AMMONIA in the last 168 hours.  ABG    Component Value Date/Time   PHART 7.393 04/22/2019 0223   PCO2ART 36.2 04/22/2019 0223   PO2ART 82.0 (L) 04/22/2019 0223   HCO3 22.1 04/22/2019 0223   TCO2 23 04/22/2019 0223   ACIDBASEDEF 2.0 04/22/2019 0223   O2SAT 96.0 04/22/2019 0223     Coagulation Profile: No results for input(s): INR, PROTIME in the last 168 hours.  Cardiac Enzymes: No results for input(s): CKTOTAL, CKMB, CKMBINDEX, TROPONINI in the last 168 hours.  HbA1C: Hgb A1c MFr Bld  Date/Time Value Ref Range Status  04/22/2019 12:12 AM 7.4 (H) 4.8 - 5.6 % Final    Comment:    (NOTE) Pre diabetes:          5.7%-6.4% Diabetes:              >6.4% Glycemic control for   <7.0% adults with diabetes   07/10/2018 01:04 PM 7.2 (H) <5.7 % of total Hgb Final    Comment:    For someone without known diabetes, a hemoglobin A1c value of 6.5% or greater indicates that they may have  diabetes and this should be confirmed with a follow-up  test. . For someone with known diabetes, a value <7% indicates  that their diabetes is well controlled and a value  greater than or equal to 7% indicates suboptimal  control. A1c targets should be individualized based on  duration of diabetes, age, comorbid conditions, and  other considerations. . Currently, no consensus exists regarding use of hemoglobin A1c for diagnosis of diabetes for children. .    CBG: Recent Labs  Lab 04/20/2019 2258 04/22/19 0330 04/22/19 0821  GLUCAP 174* 140* 132*   The patient is critically ill with multiple organ systems failure and requires high complexity decision making for assessment and support, frequent evaluation and titration of therapies, application of advanced monitoring  technologies and extensive interpretation of multiple databases.   Critical Care Time devoted to patient care services described in this note is  96  Minutes. This time reflects time of care of this signee Dr Jennet Maduro. This critical care time does not reflect procedure time, or teaching time or supervisory time of PA/NP/Med student/Med Resident etc but could involve care discussion time.  Rush Farmer, M.D. [facility] Pulmonary/Critical Care Medicine. Pager: [phone]. After hours pager: [phone].

## 2019-04-22 NOTE — Progress Notes (Signed)
[facility] Progress Note Patient Name: Alison Graves DOB: February 24, 1932 MRN: [id number]   Date of Service  04/22/2019  HPI/Events of Note  Multiple issues: 1. Hypotension - BP = 63/53 with MAP = 58, 2. Ventilator changes made at 12 midnight and 3. Last Lactic Acid level - 3.3.   eICU Interventions  Will order: 1. Phenylephrine IV infusion. Titrate to MAP >= 653. 2. ABG now. 3. Repeat Lactic Acid level at 6 AM.         Sommer,Steven Eugene 04/22/2019, 2:10 AM

## 2019-04-22 NOTE — Progress Notes (Signed)
  Echocardiogram 2D Echocardiogram has been performed.  Johny Chess 04/22/2019, 11:53 AM

## 2019-04-22 NOTE — Progress Notes (Deleted)
  Echocardiogram 2D Echocardiogram has been performed.  Jennette Dubin 04/22/2019, 11:51 AM

## 2019-04-22 NOTE — Progress Notes (Signed)
Did an ABG on patient.  Called results to Georgann Housekeeper, NP, CCM.   Results for Alison Graves, Alison Graves (MRN [id number]) as of 04/22/2019 00:01  Ref. Range 05/06/2019 23:25  pH, Arterial Latest Ref Range: 7.350 - 7.450  7.286 (L)  pCO2 arterial Latest Ref Range: 32.0 - 48.0 mmHg 49.2 (H)  pO2, Arterial Latest Ref Range: 83.0 - 108.0 mmHg 125 (H)  Acid-base deficit Latest Ref Range: 0.0 - 2.0 mmol/L 2.9 (H)  Bicarbonate Latest Ref Range: 20.0 - 28.0 mmol/L 22.9  O2 Saturation Latest Units: % 97.4  Patient temperature Unknown 97.7   Georgann Housekeeper asked this RT to make the following ventilator changes:  Rate of 24, FIO2 of 60%, leave the PEEP at 8 and Vt at 330.  Patient is resting comfortably, will continue to monitor.

## 2019-04-22 NOTE — Progress Notes (Addendum)
ANTICOAGULATION CONSULT NOTE   Pharmacy Consult for Heparin Indication: chest pain/ACS  No Known Allergies  Patient Measurements: Height: 4\' 10"  (147.3 cm) Weight: 136 lb 14.5 oz (62.1 kg) IBW/kg (Calculated) : 40.9 Heparin Dosing Weight: TBW  Vital Signs: Temp: 97.7 F (36.5 C) (10/20 0800) Temp Source: Oral (10/20 0800) BP: 131/63 (10/20 1100) Pulse Rate: 74 (10/20 1100)  Labs: Recent Labs    04/27/2019 1545  04/20/2019 1937 04/04/2019 2120 04/05/2019 2150 04/22/19 0012 04/22/19 0215 04/22/19 0223 04/22/19 0448 04/22/19 0653 04/22/19 1244  HGB 16.7*   < >  --   --  17.0* 15.5*  --  15.3*  --   --   --   HCT 50.8*   < >  --   --  50.0* 46.9*  --  45.0  --   --   --   PLT 301  --   --   --   --  266  --   --   --   --   --   HEPARINUNFRC  --   --   --   --   --   --   --   --  0.48  --  0.51  CREATININE 1.05*   < >  --   --  1.11*  --  1.05*  --   --  0.94  --   TROPONINIHS 279*  --  553* 790* 771*  --   --   --   --   --   --    < > = values in this interval not displayed.    Estimated Creatinine Clearance: 34.1 mL/min (by C-G formula based on SCr of 0.94 mg/dL).  Assessment: Pt is a 85 y/o female who presented with abdominal pain. Pt was very tachypneic, hypoxic, and in distress, which required intubation. Due to troponin elevation, there is concern for ACS. Pharmacy consulted to dose heparin. Cardiology planning heparin x 48 hours (started 10/19 at 2130).  Confirmatory heparin level remains therapeutic. CBC wnl. No active bleed issues documented.  Goal of Therapy:  Heparin level 0.3-0.7 units/ml Monitor platelets by anticoagulation protocol: Yes   Plan:  Continue heparin at 700 units/hr Monitor daily heparin level and CBC, s/sx bleeding Cardiology planning heparin x 48 hours   Elicia Lamp, PharmD, BCPS Please check AMION for all [facility] contact numbers Clinical Pharmacist 04/22/2019 1:13 PM

## 2019-04-22 NOTE — Progress Notes (Signed)
Initial Nutrition Assessment  DOCUMENTATION CODES:   Not applicable  INTERVENTION:   Tube feeding:  Vital High Protein @ 50 ml/hr via OGT (1200 ml)  Provides: 1200 kcals, 105 grams protein, 1003 ml free water. Meets 109% of protein needs and 100% of kcal needs.   NUTRITION DIAGNOSIS:   Increased nutrient needs related to wound healing as evidenced by estimated needs.  GOAL:   Patient will meet greater than or equal to 90% of their needs  MONITOR:   Diet advancement, Vent status, Skin, TF tolerance, Weight trends, Labs, I & O's  REASON FOR ASSESSMENT:   Ventilator    ASSESSMENT:   Patient with PMH significant for DM and HTN. Presents this admission with shortness of breath. Concern for post obstructive PNA vs aspiration and elevated troponin.   Spoke with RN. Plan to start feeding today. PTA pt had issues swallowing. Will likely need swallow evaluation s/p extubation.   Weight shows to be relatively stable over the last year. Will utilize 62.1 kg as EDW.   Patient is currently intubated on ventilator support MV: 7.0 L/min Temp (24hrs), Avg:97.9 F (36.6 C), Min:97.7 F (36.5 C), Max:98.2 F (36.8 C)   I/O: +64 ml since admit  UOP: 1,150 ml x 24 hrs   Medications: SS novolog Labs: CBG 113-189 A1C- 7.4  NUTRITION - FOCUSED PHYSICAL EXAM:  Deferred. Per RN, pt agitated upon touch. Undergoing ECHO at time of RD visit. Will attempt at follow up if possible.   Diet Order:   Diet Order            Diet NPO time specified  Diet effective now              EDUCATION NEEDS:   Not appropriate for education at this time  Skin:  Skin Assessment: Skin Integrity Issues: Skin Integrity Issues:: Stage I Stage I: sacrum  Last BM:  PTA  Height:   Ht Readings from Last 1 Encounters:  05/13/19 4\' 10"  (1.473 m)    Weight:   Wt Readings from Last 1 Encounters:  04/22/19 62.1 kg    Ideal Body Weight:  43.9 kg  BMI:  Body mass index is 28.61  kg/m.  Estimated Nutritional Needs:   Kcal:  1101 kcal  Protein:  85-105 grams  Fluid:  >/= 1.1 L/day  Mariana Single RD, LDN Clinical Nutrition Pager # - [phone]

## 2019-04-23 ENCOUNTER — Inpatient Hospital Stay (HOSPITAL_COMMUNITY): Payer: Medicare Other

## 2019-04-23 DIAGNOSIS — J9601 Acute respiratory failure with hypoxia: Secondary | ICD-10-CM | POA: Diagnosis not present

## 2019-04-23 DIAGNOSIS — J810 Acute pulmonary edema: Secondary | ICD-10-CM

## 2019-04-23 DIAGNOSIS — R778 Other specified abnormalities of plasma proteins: Secondary | ICD-10-CM | POA: Diagnosis not present

## 2019-04-23 DIAGNOSIS — T18108A Unspecified foreign body in esophagus causing other injury, initial encounter: Secondary | ICD-10-CM

## 2019-04-23 DIAGNOSIS — T189XXA Foreign body of alimentary tract, part unspecified, initial encounter: Secondary | ICD-10-CM | POA: Diagnosis not present

## 2019-04-23 DIAGNOSIS — I249 Acute ischemic heart disease, unspecified: Secondary | ICD-10-CM | POA: Diagnosis not present

## 2019-04-23 DIAGNOSIS — J189 Pneumonia, unspecified organism: Secondary | ICD-10-CM | POA: Diagnosis not present

## 2019-04-23 DIAGNOSIS — G9340 Encephalopathy, unspecified: Secondary | ICD-10-CM | POA: Diagnosis not present

## 2019-04-23 LAB — GLUCOSE, CAPILLARY
Glucose-Capillary: 118 mg/dL — ABNORMAL HIGH (ref 70–99)
Glucose-Capillary: 130 mg/dL — ABNORMAL HIGH (ref 70–99)
Glucose-Capillary: 146 mg/dL — ABNORMAL HIGH (ref 70–99)
Glucose-Capillary: 165 mg/dL — ABNORMAL HIGH (ref 70–99)
Glucose-Capillary: 171 mg/dL — ABNORMAL HIGH (ref 70–99)
Glucose-Capillary: 180 mg/dL — ABNORMAL HIGH (ref 70–99)

## 2019-04-23 LAB — POCT I-STAT 7, (LYTES, BLD GAS, ICA,H+H)
Acid-base deficit: 1 mmol/L (ref 0.0–2.0)
Bicarbonate: 23.5 mmol/L (ref 20.0–28.0)
Calcium, Ion: 1.22 mmol/L (ref 1.15–1.40)
HCT: 33 % — ABNORMAL LOW (ref 36.0–46.0)
Hemoglobin: 11.2 g/dL — ABNORMAL LOW (ref 12.0–15.0)
O2 Saturation: 99 %
Patient temperature: 97.9
Potassium: 3.2 mmol/L — ABNORMAL LOW (ref 3.5–5.1)
Sodium: 142 mmol/L (ref 135–145)
TCO2: 25 mmol/L (ref 22–32)
pCO2 arterial: 36.4 mmHg (ref 32.0–48.0)
pH, Arterial: 7.417 (ref 7.350–7.450)
pO2, Arterial: 119 mmHg — ABNORMAL HIGH (ref 83.0–108.0)

## 2019-04-23 LAB — BASIC METABOLIC PANEL
Anion gap: 7 (ref 5–15)
BUN: 26 mg/dL — ABNORMAL HIGH (ref 8–23)
CO2: 23 mmol/L (ref 22–32)
Calcium: 8.1 mg/dL — ABNORMAL LOW (ref 8.9–10.3)
Chloride: 111 mmol/L (ref 98–111)
Creatinine, Ser: 0.93 mg/dL (ref 0.44–1.00)
GFR calc Af Amer: >60 mL/min (ref >60)
GFR calc non Af Amer: 56 mL/min — ABNORMAL LOW (ref >60)
Glucose, Bld: 163 mg/dL — ABNORMAL HIGH (ref 70–99)
Potassium: 3.2 mmol/L — ABNORMAL LOW (ref 3.5–5.1)
Sodium: 141 mmol/L (ref 135–145)

## 2019-04-23 LAB — CBC
HCT: 39.1 % (ref 36.0–46.0)
Hemoglobin: 12.6 g/dL (ref 12.0–15.0)
MCH: 30.3 pg (ref 26.0–34.0)
MCHC: 32.2 g/dL (ref 30.0–36.0)
MCV: 94 fL (ref 80.0–100.0)
Platelets: 200 10*3/uL (ref 150–400)
RBC: 4.16 MIL/uL (ref 3.87–5.11)
RDW: 14.4 % (ref 11.5–15.5)
WBC: 12.2 10*3/uL — ABNORMAL HIGH (ref 4.0–10.5)
nRBC: 0.2 % (ref 0.0–0.2)

## 2019-04-23 LAB — LEGIONELLA PNEUMOPHILA SEROGP 1 UR AG: L. pneumophila Serogp 1 Ur Ag: NEGATIVE

## 2019-04-23 LAB — PHOSPHORUS: Phosphorus: 1 mg/dL — CL (ref 2.5–4.6)

## 2019-04-23 LAB — ECHOCARDIOGRAM COMPLETE
Height: 58 in
Weight: 2190.49 oz

## 2019-04-23 LAB — MAGNESIUM: Magnesium: 2.2 mg/dL (ref 1.7–2.4)

## 2019-04-23 LAB — HEPARIN LEVEL (UNFRACTIONATED): Heparin Unfractionated: 0.36 IU/mL (ref 0.30–0.70)

## 2019-04-23 MED ORDER — CARVEDILOL 3.125 MG PO TABS
3.1250 mg | ORAL_TABLET | Freq: Two times a day (BID) | ORAL | Status: DC
  Administered 2019-04-23 – 2019-04-26 (×6): 3.125 mg
  Filled 2019-04-23 (×6): qty 1

## 2019-04-23 MED ORDER — ETOMIDATE 2 MG/ML IV SOLN
INTRAVENOUS | Status: AC
  Administered 2019-04-23: 20 mg via INTRAVENOUS
  Filled 2019-04-23: qty 10

## 2019-04-23 MED ORDER — SODIUM PHOSPHATES 45 MMOLE/15ML IV SOLN
30.0000 mmol | Freq: Once | INTRAVENOUS | Status: AC
  Administered 2019-04-23: 30 mmol via INTRAVENOUS
  Filled 2019-04-23: qty 10

## 2019-04-23 MED ORDER — ETOMIDATE 2 MG/ML IV SOLN
20.0000 mg | Freq: Once | INTRAVENOUS | Status: AC
  Administered 2019-04-23: 11:00:00 20 mg via INTRAVENOUS

## 2019-04-23 MED ORDER — DEXMEDETOMIDINE HCL IN NACL 400 MCG/100ML IV SOLN
0.4000 ug/kg/h | INTRAVENOUS | Status: DC
  Administered 2019-04-23: 0.6 ug/kg/h via INTRAVENOUS
  Administered 2019-04-23 – 2019-04-24 (×2): 1 ug/kg/h via INTRAVENOUS
  Administered 2019-04-24 – 2019-04-25 (×5): 1.2 ug/kg/h via INTRAVENOUS
  Administered 2019-04-25: 1 ug/kg/h via INTRAVENOUS
  Administered 2019-04-25 – 2019-04-26 (×4): 1.2 ug/kg/h via INTRAVENOUS
  Filled 2019-04-23 (×13): qty 100

## 2019-04-23 MED ORDER — CHLORHEXIDINE GLUCONATE CLOTH 2 % EX PADS
6.0000 | MEDICATED_PAD | Freq: Every day | CUTANEOUS | Status: DC
  Administered 2019-04-24 – 2019-04-25 (×2): 6 via TOPICAL

## 2019-04-23 MED ORDER — POTASSIUM CHLORIDE 20 MEQ/15ML (10%) PO SOLN
30.0000 meq | ORAL | Status: AC
  Administered 2019-04-23 (×2): 30 meq
  Filled 2019-04-23 (×2): qty 30

## 2019-04-23 MED ORDER — VECURONIUM BROMIDE 10 MG IV SOLR
INTRAVENOUS | Status: AC
  Administered 2019-04-23: 10 mg via INTRAVENOUS
  Filled 2019-04-23: qty 10

## 2019-04-23 MED ORDER — VECURONIUM BROMIDE 10 MG IV SOLR
10.0000 mg | Freq: Once | INTRAVENOUS | Status: AC
  Administered 2019-04-23: 11:00:00 10 mg via INTRAVENOUS

## 2019-04-23 NOTE — Progress Notes (Signed)
[place] Progress Note Patient Name: Alison Graves DOB: 06-05-1932 MRN: [id number]   Date of Service  04/23/2019  HPI/Events of Note  Pt is agitated, hypertensive and tachycardic on the ventilator despite maximum dose Precedex infusion.  eICU Interventions  Fentanyl infusion started at fixed dose of 10 mics.        Kerry Kass Ogan 04/23/2019, 8:16 PM

## 2019-04-23 NOTE — Progress Notes (Signed)
SLP Cancellation Note  Patient Details Name: DREANNA KYLLO MRN: [id number] DOB: 05-Apr-1932   Cancelled treatment:       Reason Eval/Treat Not Completed: Patient not medically ready. Pt still intubated but family reports a history of dysphagia. Will follow for readiness for assessment.    Juanya Villavicencio, Katherene Ponto 04/23/2019, 10:30 AM

## 2019-04-23 NOTE — Progress Notes (Addendum)
..   NAME:  Alison Graves, MRN:  [id number], DOB:  July 04, 1931, LOS: 2 ADMISSION DATE:  04/18/2019, CONSULTATION DATE:  04/03/2019 REFERRING MD:  Juleen China MD, CHIEF COMPLAINT:  SOB and Abdominal Pain   Brief History   - Spanish speaking female, understands very little [place]-   85 yr old F w/ PMHx sig for DM and HTN presented on 10/19 to urgent care with her daughter for SOB found to be hypoxic and hypertensive requiring intubation for respiratory failure.  Additionally found to have elevated troponin and cardiology consulted.   Past Medical History   Active Ambulatory Problems    Diagnosis Date Noted  . Dyslipidemia 11/29/2006  . HTN (hypertension) 11/20/2006  . HEMORRHOIDS 07/25/2006  . MENOPAUSE-RELATED VASOMOTOR SYMPTOMS 04/19/2009  . Osteoarthritis of both knees 12/16/2008  . Polymyalgia rheumatica (HCC) 01/10/2008  . Encounter for intubation 10/04/2010  . Osteopenia 10/04/2010  . Anxiety 11/16/2011  . Edema 02/17/2015  . PCP NOTES >>>>>>>>>>>>>>>>>>>>>>>>> 09/26/2015  . Chondrocalcinosis 04/24/2016  . Osteoarthritis of patellofemoral joints of both knees 04/24/2016  . Osteoarthritis of both hands 04/24/2016  . Osteoarthritis of feet, bilateral 04/24/2016  . Diabetes mellitus, type II (HCC) 01/25/2017  . Increased anion gap metabolic acidosis   . Respiratory acidosis   . Multilobar lung infiltrate    Resolved Ambulatory Problems    Diagnosis Date Noted  . WOUND, HAND 04/21/2010  . CHEST PAIN, ATYPICAL 08/26/2010  . HTN (hypertension)   . Bronchitis 07/12/2012   Past Medical History:  Diagnosis Date  . Diabetes mellitus without complication (HCC)   . Hemorrhoids   . Hyperlipemia   . Microhematuria   . Osteoarthritis   . PMR (polymyalgia rheumatica) (HCC)    Significant Hospital Events   10/19 Admitted/ intubated 10/20 off vasopressors   Consults:  10/19 Cardiology   Procedures:  04/30/2019>> ETT  Significant Diagnostic Tests:  10/19 CTA PE / CT A/P >>  1. No evidence for acute pulmonary embolus. 2. Extensive, bilateral multifocal airspace consolidation identified concerning for multifocal pneumonia. Right lower lobe appears most severely affected. Underlying malignancy would be difficult to exclude and follow-up imaging to resolution is advise. If this does not improve with antibiotic therapy then further investigation with bronchoscopy may be considered to assess for underlying tumor. 3. Cardiac enlargement, bilateral pleural effusions and diffuse pulmonary edema compatible with CHF. Reflux of contrast from the right heart into the IVC and hepatic veins suggest right heart failure. 4. No acute findings within the abdomen or pelvis. 5. Three vessel coronary artery calcifications noted. 6. Lumbar spondylosis. Aortic Atherosclerosis  10/20 TTE >> 1. Left ventricular ejection fraction, by visual estimation, is 40 to 45%. The left ventricle has normal function. Normal left ventricular size. There is no left ventricular hypertrophy.  2. Left ventricular diastolic Doppler parameters are consistent with impaired relaxation pattern of LV diastolic filling.  3. Global right ventricle has mildly reduced systolic function.The right ventricular size is normal. No increase in right ventricular wall thickness.  4. Left atrial size was normal.  5. Right atrial size was normal.  6. Moderate mitral annular calcification.  7. The mitral valve is normal in structure. No evidence of mitral valve regurgitation. No evidence of mitral stenosis.  8. The tricuspid valve is normal in structure. Tricuspid valve regurgitation was not visualized by color flow Doppler.  9. The aortic valve is tricuspid Aortic valve regurgitation is mild by color flow Doppler. Mild to moderate aortic valve sclerosis/calcification without any evidence of aortic stenosis. 10.  The pulmonic valve was normal in structure. Pulmonic valve regurgitation is trivial by color flow Doppler. 11. There is  mild dilatation of the ascending aorta measuring 40 mm. 12. Normal pulmonary artery systolic pressure. 13. The inferior vena cava is normal in size with greater than 50% respiratory variability, suggesting right atrial pressure of 3 mmHg.  Micro Data:  10/19 SARS CoV2 >> neg 10/19 MRSA PCR >> neg 10/19 RVP >> 10/20 trach asp >> 10/19 BCx 2>>  10/19 urine strep >> neg 10/19 urine legionella >>  Antimicrobials:  10/19 vanc > 10/20 10/19 flagyl  10/19 cefepime >>  Subjective: Afebrile Remains off vasopressors but sedated on fentanyl 400 mcg/ hr for increased periods of agitation  New foreign object noted on CXR over neck- suspected dental bridge  Objective   Blood pressure (!) 193/115, pulse (!) 132, temperature 97.9 F (36.6 C), temperature source Axillary, resp. rate 19, height 4\' 10"  (1.473 m), weight 63.6 kg, SpO2 100 %.    Vent Mode: PRVC FiO2 (%):  [40 %-50 %] 40 % Set Rate:  [22 bmp-24 bmp] 22 bmp Vt Set:  [330 mL] 330 mL PEEP:  [8 cmH20] 8 cmH20 Plateau Pressure:  [17 cmH20-18 cmH20] 17 cmH20   Intake/Output Summary (Last 24 hours) at 04/23/2019 0834 Last data filed at 04/23/2019 0800 Gross per 24 hour  Intake 2463.74 ml  Output 780 ml  Net 1683.74 ml   Filed Weights   February 28, 2019 2000 04/22/19 0500 04/23/19 0500  Weight: 59.4 kg 62.1 kg 63.6 kg   Examination: General: Elderly female sedated on MV in NAD HEENT: MM pink/moist, ETT/ OGT, pupils pinpoint, no obvious f/o on oral inspection Neuro: sedated, RASS -4 CV: RR, no murmur PULM:  MV supported breaths, coarse breath sounds t/o GI: soft, hypo BS, foley  Extremities: warm/dry, +1-2 LE edema  Skin: no rashes   CXR reviewed, improved from yesterday but still pronounced RLL infiltrate, new foriegn body over neck area, radiologist suspects in posterior pharynx    Resolved Hospital Problem list     Assessment & Plan:  Acute Respiratory Failure w/ hypoxia  PNA  - acute PE ruled out, CT concerning for post  obstructive pneumonia vs aspiration Plan: Continue full MV support Improving FiO2/ PEEP, currently on 40% /5 Trend CXR/ ABG Follow cultures Continue cefepime, d/c vanc Will need f/u imaging after abx tx to ensure clearing of RLL, r/o mass PAD protocol - reduced fentanyl gtt (currently maxed) and add precedex for RASS goal 0-/1  Foreign body - new on CXR, suspected dental bridge P:  Will look with glidescope, may need bronch- vs GI consult depending on if airway vs esophagus   HTN Urgency On home Clonidine, Coreg, Losartan and HCTZ Highest BP in ED>>175/17800mmHg Plan: Will start carvedilol with holding parameters, likely will need more management as fentanyl gtt is reduced   Septic Shock - resolved  - +/- component of cardiogenic given new  P:  Off vasopressor requirements since 10/19, currently hypertensive episodes  Goal MAP > 65  CHF Elevated troponin CAD H/o Dyslipidemia - no prior TTE - EKG with prolonged QT, LAFB, and LVH- no acute ischemic changes - Hs trop tren 279- > 790 - CT with evidence of calcifications  Plan: Appreciate cardiology assistance Echo with EF 40-45%, diastolic HF, mildly reduced RV function, no focal wall deficits Plans to d/c heparin after 48 hours Continue ASA/ atorvastatin  Ok to start carvedilol as shock as resolved Will need ischemic evaluation after critical illness resolved  NIDDM Previously on Metformin Pt BG >300 on presentation with AG Metabolic Acidosis + ketones on UA Betahydroxybutyrate 0.45 Plan: Continue SSI, CBG q 4  Best practice:  Diet: NPO Pain/Anxiety/Delirium protocol (if indicated): RASS goal 0/-1, reduce fentanyl, add precedex  VAP protocol (if indicated): yes DVT prophylaxis: on Heparin gtt for ACS for 48 hrs GI prophylaxis: Protonix Q24h Glucose control: SSI Mobility: Bedrest Code Status: Full Family Communication: Albin Felling updated at bedside on plan of care 10/21.   Pt is spanish speaking.  Daughters -   [phone] is Lorna Few and Ronda Fairly [phone] Disposition: ICU  Labs   CBC: Recent Labs  Lab 04/25/2019 1545  04/22/2019 2150 04/22/19 0012 04/22/19 0223 04/23/19 0420 04/23/19 0556  WBC 12.9*  --   --  12.8*  --   --  12.2*  NEUTROABS 10.1*  --   --   --   --   --   --   HGB 16.7*   < > 17.0* 15.5* 15.3* 11.2* 12.6  HCT 50.8*   < > 50.0* 46.9* 45.0 33.0* 39.1  MCV 93.6  --   --  92.1  --   --  94.0  PLT 301  --   --  266  --   --  200   < > = values in this interval not displayed.    Basic Metabolic Panel: Recent Labs  Lab 04/24/2019 1545 04/04/2019 1551 04/04/2019 2150 04/22/19 0215 04/22/19 0223 04/22/19 1962 04/22/19 1929 04/23/19 0420 04/23/19 0556  NA 135 137 135  141 140 141 143  --  142 141  K 5.5* 5.4* 5.0  3.9 3.3* 3.2* 3.8  --  3.2* 3.2*  CL 98 104 99 109  --  109  --   --  111  CO2 22  --  21* 18*  --  20*  --   --  23  GLUCOSE 262* 262* 189* 159*  --  113*  --   --  163*  BUN 21 30* 24* 24*  --  22  --   --  26*  CREATININE 1.05* 1.00 1.11* 1.05*  --  0.94  --   --  0.93  CALCIUM 10.1  --  8.9 9.7  --  8.6*  --   --  8.1*  MG  --   --   --  1.4*  --  2.1 2.8*  --  2.2  PHOS  --   --   --   --   --   --   --   --  1.0*   GFR: Estimated Creatinine Clearance: 34.9 mL/min (by C-G formula based on SCr of 0.93 mg/dL). Recent Labs  Lab 04/10/2019 1542 04/09/2019 1545 04/16/2019 1937 04/22/19 0012 04/22/19 0448 04/22/19 0653 04/23/19 0556  WBC  --  12.9*  --  12.8*  --   --  12.2*  LATICACIDVEN 4.0*  --  3.3*  --  3.2* 2.8*  --     Liver Function Tests: Recent Labs  Lab 04/30/2019 1545 04/30/2019 2150 04/22/19 0215  AST 50* 52* 34  ALT 20 19 19   ALKPHOS 78 74 63  BILITOT 1.7* 1.6* 1.0  PROT 7.7 7.1 6.2*  ALBUMIN 4.0 3.5 2.8*   Recent Labs  Lab 04/15/2019 1545  LIPASE 16   No results for input(s): AMMONIA in the last 168 hours.  ABG    Component Value Date/Time   PHART 7.417 04/23/2019 0420   PCO2ART 36.4 04/23/2019 0420  PO2ART  119.0 (H) 04/23/2019 0420   HCO3 23.5 04/23/2019 0420   TCO2 25 04/23/2019 0420   ACIDBASEDEF 1.0 04/23/2019 0420   O2SAT 99.0 04/23/2019 0420     Coagulation Profile: No results for input(s): INR, PROTIME in the last 168 hours.  Cardiac Enzymes: No results for input(s): CKTOTAL, CKMB, CKMBINDEX, TROPONINI in the last 168 hours.  HbA1C: Hgb A1c MFr Bld  Date/Time Value Ref Range Status  04/22/2019 12:12 AM 7.4 (H) 4.8 - 5.6 % Final    Comment:    (NOTE) Pre diabetes:          5.7%-6.4% Diabetes:              >6.4% Glycemic control for   <7.0% adults with diabetes   07/10/2018 01:04 PM 7.2 (H) <5.7 % of total Hgb Final    Comment:    For someone without known diabetes, a hemoglobin A1c value of 6.5% or greater indicates that they may have  diabetes and this should be confirmed with a follow-up  test. . For someone with known diabetes, a value <7% indicates  that their diabetes is well controlled and a value  greater than or equal to 7% indicates suboptimal  control. A1c targets should be individualized based on  duration of diabetes, age, comorbid conditions, and  other considerations. . Currently, no consensus exists regarding use of hemoglobin A1c for diagnosis of diabetes for children. .    CBG: Recent Labs  Lab 04/22/19 1656 04/22/19 1931 04/22/19 2337 04/23/19 0403 04/23/19 0755  GLUCAP 136* 123* 117* 180* 146*   CCT 40 mins  Kennieth Rad, MSN, AGACNP-BC [facility] 04/23/2019, 8:34 AM  Attending Note:  85 year old female with history of aspiration presenting with R>L infiltrate.  Intermittent agitation overnight.  On exam, puss visualized via laryngoscopy in the upper airway above the ETT with diffuse crackles.  I reviewed CXR myself, upper airway with crowns vs teeth present.  Was able to place glidescope in and visualize the tip of the crown in the esophagus and were removed via [place].  Daughter updated via NP.  Will  continue full vent support.  Continue abx.  Diureses as able.  Will need to discuss plan of care once ready for extubation whether a tracheostomy is a consideration given chronic aspiration and likely continued respiratory failure.  PCCM will continue to manage.  The patient is critically ill with multiple organ systems failure and requires high complexity decision making for assessment and support, frequent evaluation and titration of therapies, application of advanced monitoring technologies and extensive interpretation of multiple databases.   Critical Care Time devoted to patient care services described in this note is  90  Minutes. This time reflects time of care of this signee Dr Jennet Maduro. This critical care time does not reflect procedure time, or teaching time or supervisory time of PA/NP/Med student/Med Resident etc but could involve care discussion time.  Rush Farmer, M.D. [facility] Pulmonary/Critical Care Medicine. Pager: [phone]. After hours pager: [phone].

## 2019-04-23 NOTE — Progress Notes (Signed)
ANTICOAGULATION CONSULT NOTE   Pharmacy Consult for Heparin Indication: chest pain/ACS  No Known Allergies  Patient Measurements: Height: 4\' 10"  (147.3 cm) Weight: 140 lb 3.4 oz (63.6 kg) IBW/kg (Calculated) : 40.9 Heparin Dosing Weight: TBW  Vital Signs: Temp: 97.9 F (36.6 C) (10/21 0800) Temp Source: Axillary (10/21 0800) BP: 131/77 (10/21 1000) Pulse Rate: 87 (10/21 1000)  Labs: Recent Labs    23-Apr-2019 1545  2019/04/23 1937 2019-04-23 2120 04-23-2019 2150 04/22/19 0012 04/22/19 0215 04/22/19 0223 04/22/19 0448 04/22/19 0653 04/22/19 1244 04/23/19 0420 04/23/19 0556  HGB 16.7*   < >  --   --  17.0* 15.5*  --  15.3*  --   --   --  11.2* 12.6  HCT 50.8*   < >  --   --  50.0* 46.9*  --  45.0  --   --   --  33.0* 39.1  PLT 301  --   --   --   --  266  --   --   --   --   --   --  200  HEPARINUNFRC  --   --   --   --   --   --   --   --  0.48  --  0.51  --  0.36  CREATININE 1.05*   < >  --   --  1.11*  --  1.05*  --   --  0.94  --   --  0.93  TROPONINIHS 279*  --  553* 790* 771*  --   --   --   --   --   --   --   --    < > = values in this interval not displayed.    Estimated Creatinine Clearance: 34.9 mL/min (by C-G formula based on SCr of 0.93 mg/dL).  Assessment: Pt is a 85 y/o female who presented with abdominal pain. Pt was very tachypneic, hypoxic, and in distress, which required intubation. Due to troponin elevation, there is concern for ACS. Pharmacy consulted to dose heparin. Cardiology planning heparin x 48 hours (started 10/19 at 2130).  Heparin level remains therapeutic. CBC wnl. No active bleed issues documented.  Goal of Therapy:  Heparin level 0.3-0.7 units/ml Monitor platelets by anticoagulation protocol: Yes   Plan:  Continue heparin at 700 units/hr Monitor daily heparin level and CBC, s/sx bleeding Cardiology planning heparin x 48 hours - stop time entered for 2130 tonight F/u start VTE prophylaxis when heparin drip off  Elicia Lamp, PharmD,  BCPS Please check AMION for all [place] contact numbers Clinical Pharmacist 04/23/2019 11:27 AM

## 2019-04-23 NOTE — Progress Notes (Signed)
RT assisted PCCM with glidescope for removal of teeth in upper esophagus with the use of [place]. No apparent complications noted. RT will continue to monitor.

## 2019-04-23 NOTE — Progress Notes (Signed)
Progress Note  Patient Name: Alison Graves Date of Encounter: 04/23/2019  Primary Cardiologist: Skeet Latch, MD (new)  Subjective   Unable to obtain.  Intubated and sedated.   Inpatient Medications    Scheduled Meds:  aspirin  81 mg Per Tube Daily   atorvastatin  40 mg Oral q1800   chlorhexidine gluconate (MEDLINE KIT)  15 mL Mouth Rinse BID   Chlorhexidine Gluconate Cloth  6 each Topical Daily   feeding supplement (VITAL HIGH PROTEIN)  1,000 mL Per Tube Q24H   gabapentin  100 mg Per Tube Q8H   insulin aspart  0-9 Units Subcutaneous Q4H   mouth rinse  15 mL Mouth Rinse 10 times per day   pantoprazole (PROTONIX) IV  40 mg Intravenous QHS   potassium chloride  30 mEq Per Tube Q4H   Continuous Infusions:  sodium chloride 10 mL/hr at 04/23/19 0600   ceFEPime (MAXIPIME) IV Stopped (04/23/19 0538)   fentaNYL infusion INTRAVENOUS 400 mcg/hr (04/23/19 0600)   heparin 700 Units/hr (04/23/19 0600)   phenylephrine (NEO-SYNEPHRINE) Adult infusion Stopped (04/22/19 0926)   sodium phosphate  Dextrose 5% IVPB     vancomycin     PRN Meds: sodium chloride, fentaNYL, fentaNYL (SUBLIMAZE) injection, ipratropium-albuterol, midazolam   Vital Signs    Vitals:   04/23/19 0416 04/23/19 0500 04/23/19 0600 04/23/19 0749  BP:  (!) 114/58 130/62 102/63  Pulse:  74 80 76  Resp:  (!) 24 (!) 24 (!) 26  Temp: 97.9 F (36.6 C)     TempSrc: Oral     SpO2:  100% 100% 100%  Weight:  63.6 kg    Height:        Intake/Output Summary (Last 24 hours) at 04/23/2019 0752 Last data filed at 04/23/2019 0600 Gross per 24 hour  Intake 2463.74 ml  Output 885 ml  Net 1578.74 ml   Last 3 Weights 04/23/2019 04/22/2019 04/11/2019  Weight (lbs) 140 lb 3.4 oz 136 lb 14.5 oz 131 lb  Weight (kg) 63.6 kg 62.1 kg 59.421 kg      Telemetry    Sinus rhythm, sinus tachycardia.  PVCs - Personally Reviewed  ECG    04/13/2019: sinus tachycardia.  Rate 111 bpm.  LAFB.  LVH with  repolarization abnormalities. - Personally Reviewed  Physical Exam   VS:  BP 102/63    Pulse 76    Temp 97.9 F (36.6 C) (Oral)    Resp (!) 26    Ht _0  (1.473 m)    Wt 63.6 kg    SpO2 100%    BMI 29.30 kg/m  , BMI Body mass index is 29.3 kg/m. GENERAL:  Critically ill-appearing.  Intubated and sedated. HEENT: Pupils equal round and reactive, fundi not visualized, oral mucosa unremarkable NECK:  No jugular venous distention, waveform within normal limits, carotid upstroke brisk and symmetric, no bruits LUNGS:  Vented breath sounds on anterior exam HEART:  RRR.  PMI not displaced or sustained,S1 and S2 within normal limits, no S3, no S4, no clicks, no rubs, no murmurs ABD:  Flat, positive bowel sounds normal in frequency in pitch, no bruits, no rebound, no guarding, no midline pulsatile mass, no hepatomegaly, no splenomegaly EXT:  Warm.  No edema SKIN:  No rashes no nodules NEURO:  Unable to assess.  Intubated and sedated.  PSYCH:  Unable to assess.  Intubated and sedated.   Labs    High Sensitivity Troponin:   Recent Labs  Lab 04/30/2019 1545 04/05/2019 1937 04/27/2019  2120 04/10/2019 2150  TROPONINIHS 279* 553* 790* 771*      Chemistry Recent Labs  Lab 04/27/2019 1545  04/28/2019 2150 04/22/19 0215  04/22/19 0653 04/23/19 0420 04/23/19 0556  NA 135   < > 135   141 140   < > 143 142 141  K 5.5*   < > 5.0   3.9 3.3*   < > 3.8 3.2* 3.2*  CL 98   < > 99 109  --  109  --  111  CO2 22  --  21* 18*  --  20*  --  23  GLUCOSE 262*   < > 189* 159*  --  113*  --  163*  BUN 21   < > 24* 24*  --  22  --  26*  CREATININE 1.05*   < > 1.11* 1.05*  --  0.94  --  0.93  CALCIUM 10.1  --  8.9 9.7  --  8.6*  --  8.1*  PROT 7.7  --  7.1 6.2*  --   --   --   --   ALBUMIN 4.0  --  3.5 2.8*  --   --   --   --   AST 50*  --  52* 34  --   --   --   --   ALT 20  --  19 19  --   --   --   --   ALKPHOS 78  --  74 63  --   --   --   --   BILITOT 1.7*  --  1.6* 1.0  --   --   --   --   GFRNONAA 48*  --   45* 48*  --  55*  --  56*  GFRAA 56*  --  52* 56*  --  >60  --  >60  ANIONGAP 15  --  15 13  --  14  --  7   < > = values in this interval not displayed.     Hematology Recent Labs  Lab 04/19/2019 1545  04/22/19 0012 04/22/19 0223 04/23/19 0420 04/23/19 0556  WBC 12.9*  --  12.8*  --   --  12.2*  RBC 5.43*  --  5.09  --   --  4.16  HGB 16.7*   < > 15.5* 15.3* 11.2* 12.6  HCT 50.8*   < > 46.9* 45.0 33.0* 39.1  MCV 93.6  --  92.1  --   --  94.0  MCH 30.8  --  30.5  --   --  30.3  MCHC 32.9  --  33.0  --   --  32.2  RDW 13.7  --  13.8  --   --  14.4  PLT 301  --  266  --   --  200   < > = values in this interval not displayed.    BNP Recent Labs  Lab 04/06/2019 1542  BNP 317.6*     DDimer No results for input(s): DDIMER in the last 168 hours.   Radiology    Ct Angio Chest Pe W And/or Wo Contrast  Result Date: 04/30/2019 CLINICAL DATA:  Abdominal pain. Oxygen desaturation. EXAM: CT ANGIOGRAPHY CHEST CT ABDOMEN AND PELVIS WITH CONTRAST TECHNIQUE: Multidetector CT imaging of the chest was performed using the standard protocol during bolus administration of intravenous contrast. Multiplanar CT image reconstructions and MIPs were obtained to evaluate the vascular anatomy. Multidetector CT imaging  of the abdomen and pelvis was performed using the standard protocol during bolus administration of intravenous contrast. CONTRAST:  148m OMNIPAQUE IOHEXOL 350 MG/ML SOLN COMPARISON:  None. FINDINGS: CTA CHEST FINDINGS Cardiovascular: The main pulmonary artery is patent. No central obstructing pulmonary artery filling defects identified. Artifact from mixing of contrast with unopacified blood is noted within the central pulmonary arteries bilaterally. Reflux of contrast material from the right atrium into the IVC and hepatic veins compatible with passive venous congestion which may reflect right heart failure. Mild cardiac enlargement. No pericardial effusion. Three vessel coronary artery  atherosclerotic calcifications. Mediastinum/Nodes: No enlarged mediastinal, hilar, or axillary lymph nodes. Thyroid gland, trachea, and esophagus demonstrate no significant findings. Lungs/Pleura: There are small bilateral pleural effusions. Diffuse bilateral upper and lower lobe interlobular septal thickening with areas of ground-glass attenuation are identified compatible with pulmonary edema. There is dense airspace consolidation involving the right lower lobe, right middle lobe and left lower lobe. Partial airspace consolidation of the right upper lobe and central left upper lobe also noted. Musculoskeletal: Multi level thoracic degenerative disc disease identified. No aggressive lytic or sclerotic bone lesions identified. Review of the MIP images confirms the above findings. CT ABDOMEN and PELVIS FINDINGS Hepatobiliary: No focal liver abnormality is seen. No gallstones, gallbladder wall thickening, or biliary dilatation. Calcification within the right hepatic lobe noted, likely benign Pancreas: Unremarkable. No pancreatic ductal dilatation or surrounding inflammatory changes. Spleen: Normal in size without focal abnormality. Adrenals/Urinary Tract: Normal appearance of the adrenal glands. The kidneys are unremarkable. No mass or hydronephrosis. Stomach/Bowel: Stomach is within normal limits. Appendix appears normal. No evidence of bowel wall thickening, distention, or inflammatory changes. Vascular/Lymphatic: Aortic atherosclerosis. No aneurysm identified. No abdominopelvic adenopathy Reproductive: Status post hysterectomy. No adnexal masses. Other: No free fluid or fluid collections. Musculoskeletal: No acute or significant osseous findings. Multi level spondylosis within the lumbar spine identified. Anterolisthesis of L3 on L4 and L4 on L5 is noted and there is marked multilevel disc space narrowing and endplate spurring. Review of the MIP images confirms the above findings. IMPRESSION: 1. No evidence for  acute pulmonary embolus. 2. Extensive, bilateral multifocal airspace consolidation identified concerning for multifocal pneumonia. Right lower lobe appears most severely affected. Underlying malignancy would be difficult to exclude and follow-up imaging to resolution is advise. If this does not improve with antibiotic therapy then further investigation with bronchoscopy may be considered to assess for underlying tumor. 3. Cardiac enlargement, bilateral pleural effusions and diffuse pulmonary edema compatible with CHF. Reflux of contrast from the right heart into the IVC and hepatic veins suggest right heart failure. 4. No acute findings within the abdomen or pelvis. 5. Three vessel coronary artery calcifications noted. 6. Lumbar spondylosis. Aortic Atherosclerosis (ICD10-I70.0). Electronically Signed   By: TKerby MoorsM.D.   On: 04/10/2019 20:55   Ct Abdomen Pelvis W Contrast  Result Date: 04/09/2019 CLINICAL DATA:  Abdominal pain. Oxygen desaturation. EXAM: CT ANGIOGRAPHY CHEST CT ABDOMEN AND PELVIS WITH CONTRAST TECHNIQUE: Multidetector CT imaging of the chest was performed using the standard protocol during bolus administration of intravenous contrast. Multiplanar CT image reconstructions and MIPs were obtained to evaluate the vascular anatomy. Multidetector CT imaging of the abdomen and pelvis was performed using the standard protocol during bolus administration of intravenous contrast. CONTRAST:  1065mOMNIPAQUE IOHEXOL 350 MG/ML SOLN COMPARISON:  None. FINDINGS: CTA CHEST FINDINGS Cardiovascular: The main pulmonary artery is patent. No central obstructing pulmonary artery filling defects identified. Artifact from mixing of contrast with unopacified blood is  noted within the central pulmonary arteries bilaterally. Reflux of contrast material from the right atrium into the IVC and hepatic veins compatible with passive venous congestion which may reflect right heart failure. Mild cardiac enlargement. No  pericardial effusion. Three vessel coronary artery atherosclerotic calcifications. Mediastinum/Nodes: No enlarged mediastinal, hilar, or axillary lymph nodes. Thyroid gland, trachea, and esophagus demonstrate no significant findings. Lungs/Pleura: There are small bilateral pleural effusions. Diffuse bilateral upper and lower lobe interlobular septal thickening with areas of ground-glass attenuation are identified compatible with pulmonary edema. There is dense airspace consolidation involving the right lower lobe, right middle lobe and left lower lobe. Partial airspace consolidation of the right upper lobe and central left upper lobe also noted. Musculoskeletal: Multi level thoracic degenerative disc disease identified. No aggressive lytic or sclerotic bone lesions identified. Review of the MIP images confirms the above findings. CT ABDOMEN and PELVIS FINDINGS Hepatobiliary: No focal liver abnormality is seen. No gallstones, gallbladder wall thickening, or biliary dilatation. Calcification within the right hepatic lobe noted, likely benign Pancreas: Unremarkable. No pancreatic ductal dilatation or surrounding inflammatory changes. Spleen: Normal in size without focal abnormality. Adrenals/Urinary Tract: Normal appearance of the adrenal glands. The kidneys are unremarkable. No mass or hydronephrosis. Stomach/Bowel: Stomach is within normal limits. Appendix appears normal. No evidence of bowel wall thickening, distention, or inflammatory changes. Vascular/Lymphatic: Aortic atherosclerosis. No aneurysm identified. No abdominopelvic adenopathy Reproductive: Status post hysterectomy. No adnexal masses. Other: No free fluid or fluid collections. Musculoskeletal: No acute or significant osseous findings. Multi level spondylosis within the lumbar spine identified. Anterolisthesis of L3 on L4 and L4 on L5 is noted and there is marked multilevel disc space narrowing and endplate spurring. Review of the MIP images confirms the  above findings. IMPRESSION: 1. No evidence for acute pulmonary embolus. 2. Extensive, bilateral multifocal airspace consolidation identified concerning for multifocal pneumonia. Right lower lobe appears most severely affected. Underlying malignancy would be difficult to exclude and follow-up imaging to resolution is advise. If this does not improve with antibiotic therapy then further investigation with bronchoscopy may be considered to assess for underlying tumor. 3. Cardiac enlargement, bilateral pleural effusions and diffuse pulmonary edema compatible with CHF. Reflux of contrast from the right heart into the IVC and hepatic veins suggest right heart failure. 4. No acute findings within the abdomen or pelvis. 5. Three vessel coronary artery calcifications noted. 6. Lumbar spondylosis. Aortic Atherosclerosis (ICD10-I70.0). Electronically Signed   By: Kerby Moors M.D.   On: 04/25/2019 20:55   Dg Chest Port 1 View  Result Date: 04/23/2019 CLINICAL DATA:  Intubation EXAM: PORTABLE CHEST 1 VIEW COMPARISON:  04/08/2019, 3:48 p.m. FINDINGS: Interval placement of endotracheal tube, tip position over the lower trachea, approximately 2.5 cm above the carina. Esophagogastric tube with tip and side port below the diaphragm. Interval increase in extensive bilateral heterogeneous and interstitial airspace opacity with probable small pleural effusions, most dense and conspicuous in the right midlung. Cardiomegaly. IMPRESSION: 1. Interval placement of endotracheal tube, tip position over the lower trachea, approximately 2.5 cm above the carina. 2.  Esophagogastric tube with tip and side port below the diaphragm. 3. Interval increase in extensive bilateral heterogeneous and interstitial airspace opacity with probable small pleural effusions, most dense and conspicuous in the right midlung. Findings are consistent with worsened edema or infection. 4.  Cardiomegaly. Electronically Signed   By: Eddie Candle M.D.   On:  04/27/2019 21:20   Dg Chest Portable 1 View  Result Date: 04/25/2019 CLINICAL DATA:  Abdominal pain and  shortness of breath EXAM: PORTABLE CHEST 1 VIEW COMPARISON:  02/01/2016 FINDINGS: Mild cardiac enlargement. Aortic atherosclerosis. New airspace disease is identified within the right midlung and right base. Mild diffuse pulmonary edema is also noted. The visualized osseous structures are unremarkable. IMPRESSION: 1. New airspace disease within the right midlung and right base worrisome for pneumonia. 2. Mild pulmonary edema. Electronically Signed   By: Kerby Moors M.D.   On: 04/03/2019 16:00    Cardiac Studies   Echo 04/22/19: IMPRESSIONS   1. Left ventricular ejection fraction, by visual estimation, is 40 to 45%. The left ventricle has normal function. Normal left ventricular size. There is no left ventricular hypertrophy.  2. Left ventricular diastolic Doppler parameters are consistent with impaired relaxation pattern of LV diastolic filling.  3. Global right ventricle has mildly reduced systolic function.The right ventricular size is normal. No increase in right ventricular wall thickness.  4. Left atrial size was normal.  5. Right atrial size was normal.  6. Moderate mitral annular calcification.  7. The mitral valve is normal in structure. No evidence of mitral valve regurgitation. No evidence of mitral stenosis.  8. The tricuspid valve is normal in structure. Tricuspid valve regurgitation was not visualized by color flow Doppler.  9. The aortic valve is tricuspid Aortic valve regurgitation is mild by color flow Doppler. Mild to moderate aortic valve sclerosis/calcification without any evidence of aortic stenosis. 10. The pulmonic valve was normal in structure. Pulmonic valve regurgitation is trivial by color flow Doppler. 11. There is mild dilatation of the ascending aorta measuring 40 mm. 12. Normal pulmonary artery systolic pressure. 13. The inferior vena cava is normal in  size with greater than 50% respiratory variability, suggesting right atrial pressure of 3 mmHg.  Patient Profile     85 y.o. female with hypertension, hyperlipidemia, diabetes, polymyalgia rheumatica, and chronic lower extremity edema admitted with epigastric abdominal pain and hypoxic respiratory failure.Marland Kitchen  She was found to have multifocal pneumonia and elevated troponin.  Assessment & Plan    # Elevated troponin: High-sensitivity troponin has increased from 279 to a peak of 790.  She did not have any chest pain but did complain of epigastric discomfort on admission. EKG showed LVH with repolarization abnormalities but was without acute ischemic changes.  She was incidentally noted to have coronary calcification on chest CT.  Echo this hospitalization revealed LVEF 40-45%.  RV function was also reduced. There were no focal wall motion abnormalities.  Discontinue heparin this evening after 48 hours.  Continue aspirin and atorvastatin. Resume her carvedilol when blood pressure allows.  Plan for an ischemia evaluation when more stable.   # Hyperlipidemia: LDL 153 this admission.  Atorvastatin 46m started this admission.  She will need lipids and a CMP checked in 6 to 8 weeks.  # CAP: # Hypoxic respiratory failure: On vent and antibiotics per critical care.  # Septic shock: Now off pressors.       For questions or updates, please contact CLago VistaPlease consult [email or web address] for contact info under        Signed, TSkeet Latch MD  04/23/2019, 7:52 AM

## 2019-04-23 NOTE — Procedures (Signed)
Crown Extraction From Esophagus  Using laryngoscopy, was able to visualize 3 crown piece in the upper esophagus and removed with [place].  Rush Farmer, M.D. [facility] Pulmonary/Critical Care Medicine. Pager: [phone]. After hours pager: [phone].

## 2019-04-23 NOTE — Progress Notes (Signed)
[facility] Progress Note Patient Name: TASIA LIZ DOB: 06-14-1932 MRN: [id number]   Date of Service  04/23/2019  HPI/Events of Note  K+ 3.2, Phos 1.0  eICU Interventions  Elink electrolyte replacement protocol for K+ and Phos ordered.        Kerry Kass [place] 04/23/2019, 7:03 AM

## 2019-04-24 ENCOUNTER — Inpatient Hospital Stay (HOSPITAL_COMMUNITY): Payer: Medicare Other

## 2019-04-24 DIAGNOSIS — L8990 Pressure ulcer of unspecified site, unspecified stage: Secondary | ICD-10-CM | POA: Insufficient documentation

## 2019-04-24 LAB — GLUCOSE, CAPILLARY
Glucose-Capillary: 180 mg/dL — ABNORMAL HIGH (ref 70–99)
Glucose-Capillary: 181 mg/dL — ABNORMAL HIGH (ref 70–99)
Glucose-Capillary: 192 mg/dL — ABNORMAL HIGH (ref 70–99)
Glucose-Capillary: 206 mg/dL — ABNORMAL HIGH (ref 70–99)
Glucose-Capillary: 208 mg/dL — ABNORMAL HIGH (ref 70–99)
Glucose-Capillary: 224 mg/dL — ABNORMAL HIGH (ref 70–99)

## 2019-04-24 LAB — CBC
HCT: 33.7 % — ABNORMAL LOW (ref 36.0–46.0)
Hemoglobin: 11.1 g/dL — ABNORMAL LOW (ref 12.0–15.0)
MCH: 30.7 pg (ref 26.0–34.0)
MCHC: 32.9 g/dL (ref 30.0–36.0)
MCV: 93.1 fL (ref 80.0–100.0)
Platelets: 162 10*3/uL (ref 150–400)
RBC: 3.62 MIL/uL — ABNORMAL LOW (ref 3.87–5.11)
RDW: 14.6 % (ref 11.5–15.5)
WBC: 9.6 10*3/uL (ref 4.0–10.5)
nRBC: 0.4 % — ABNORMAL HIGH (ref 0.0–0.2)

## 2019-04-24 LAB — BASIC METABOLIC PANEL
Anion gap: 8 (ref 5–15)
BUN: 31 mg/dL — ABNORMAL HIGH (ref 8–23)
CO2: 21 mmol/L — ABNORMAL LOW (ref 22–32)
Calcium: 8.2 mg/dL — ABNORMAL LOW (ref 8.9–10.3)
Chloride: 111 mmol/L (ref 98–111)
Creatinine, Ser: 0.78 mg/dL (ref 0.44–1.00)
GFR calc Af Amer: >60 mL/min (ref >60)
GFR calc non Af Amer: >60 mL/min (ref >60)
Glucose, Bld: 260 mg/dL — ABNORMAL HIGH (ref 70–99)
Potassium: 3.8 mmol/L (ref 3.5–5.1)
Sodium: 140 mmol/L (ref 135–145)

## 2019-04-24 MED ORDER — ENOXAPARIN SODIUM 40 MG/0.4ML ~~LOC~~ SOLN
40.0000 mg | SUBCUTANEOUS | Status: DC
  Administered 2019-04-24 – 2019-04-26 (×3): 40 mg via SUBCUTANEOUS
  Filled 2019-04-24 (×3): qty 0.4

## 2019-04-24 MED ORDER — POLYETHYLENE GLYCOL 3350 17 G PO PACK
17.0000 g | PACK | Freq: Every day | ORAL | Status: DC | PRN
  Filled 2019-04-24: qty 1

## 2019-04-24 MED ORDER — GUAIFENESIN 100 MG/5ML PO SOLN
5.0000 mL | ORAL | Status: DC | PRN
  Administered 2019-04-24 – 2019-04-25 (×4): 100 mg
  Filled 2019-04-24 (×3): qty 10

## 2019-04-24 MED ORDER — POLYETHYLENE GLYCOL 3350 17 G PO PACK
17.0000 g | PACK | Freq: Every day | ORAL | Status: DC | PRN
  Administered 2019-04-24 – 2019-04-25 (×2): 17 g
  Filled 2019-04-24: qty 1

## 2019-04-24 MED ORDER — SODIUM CHLORIDE 0.9 % IV SOLN
1.0000 g | INTRAVENOUS | Status: DC
  Administered 2019-04-24 – 2019-04-25 (×2): 1 g via INTRAVENOUS
  Filled 2019-04-24 (×2): qty 10

## 2019-04-24 NOTE — Progress Notes (Addendum)
NAME:  Alison Graves, MRN:  [id number], DOB:  December 08, 1931, LOS: 3 ADMISSION DATE:  04/12/2019, CONSULTATION DATE:  04/16/2019 REFERRING MD:  Wilson Singer MD, CHIEF COMPLAINT:  SOB and Abdominal Pain   Brief History   - Spanish speaking female, understands very little [place]-   85 yr old F w/ PMHx sig for DM and HTN presented on 10/19 to urgent care with her daughter for SOB found to be hypoxic and hypertensive requiring intubation for respiratory failure.  Additionally found to have elevated troponin and cardiology consulted.   Past Medical History   Active Ambulatory Problems    Diagnosis Date Noted  . Dyslipidemia 11/29/2006  . HTN (hypertension) 11/20/2006  . HEMORRHOIDS 07/25/2006  . MENOPAUSE-RELATED VASOMOTOR SYMPTOMS 04/19/2009  . Osteoarthritis of both knees 12/16/2008  . Polymyalgia rheumatica ([place]) 01/10/2008  . Encounter for intubation 10/04/2010  . Osteopenia 10/04/2010  . Anxiety 11/16/2011  . Edema 02/17/2015  . PCP NOTES >>>>>>>>>>>>>>>>>>>>>>>>> 09/26/2015  . Chondrocalcinosis 04/24/2016  . Osteoarthritis of patellofemoral joints of both knees 04/24/2016  . Osteoarthritis of both hands 04/24/2016  . Osteoarthritis of feet, bilateral 04/24/2016  . Diabetes mellitus, type II ([place]) 01/25/2017  . Increased anion gap metabolic acidosis   . Respiratory acidosis   . Multilobar lung infiltrate    Resolved Ambulatory Problems    Diagnosis Date Noted  . WOUND, HAND 04/21/2010  . CHEST PAIN, ATYPICAL 08/26/2010  . HTN (hypertension)   . Bronchitis 07/12/2012   Past Medical History:  Diagnosis Date  . Diabetes mellitus without complication ([place])   . Hemorrhoids   . Hyperlipemia   . Microhematuria   . Osteoarthritis   . PMR (polymyalgia rheumatica) ([place])    Significant Hospital Events   10/19 Admitted/ intubated 10/20 off vasopressors   Consults:  10/19 Cardiology   Procedures:  04/05/2019>> ETT  Significant Diagnostic Tests:  10/19 CTA PE / CT A/P >> 1.  No evidence for acute pulmonary embolus. 2. Extensive, bilateral multifocal airspace consolidation identified concerning for multifocal pneumonia. Right lower lobe appears most severely affected. Underlying malignancy would be difficult to exclude and follow-up imaging to resolution is advise. If this does not improve with antibiotic therapy then further investigation with bronchoscopy may be considered to assess for underlying tumor. 3. Cardiac enlargement, bilateral pleural effusions and diffuse pulmonary edema compatible with CHF. Reflux of contrast from the right heart into the IVC and hepatic veins suggest right heart failure. 4. No acute findings within the abdomen or pelvis. 5. Three vessel coronary artery calcifications noted. 6. Lumbar spondylosis. Aortic Atherosclerosis  10/20 TTE >> 1. Left ventricular ejection fraction, by visual estimation, is 40 to 45%. The left ventricle has normal function. Normal left ventricular size. There is no left ventricular hypertrophy.  2. Left ventricular diastolic Doppler parameters are consistent with impaired relaxation pattern of LV diastolic filling.  3. Global right ventricle has mildly reduced systolic function.The right ventricular size is normal. No increase in right ventricular wall thickness.  4. Left atrial size was normal.  5. Right atrial size was normal.  6. Moderate mitral annular calcification.  7. The mitral valve is normal in structure. No evidence of mitral valve regurgitation. No evidence of mitral stenosis.  8. The tricuspid valve is normal in structure. Tricuspid valve regurgitation was not visualized by color flow Doppler.  9. The aortic valve is tricuspid Aortic valve regurgitation is mild by color flow Doppler. Mild to moderate aortic valve sclerosis/calcification without any evidence of aortic stenosis. 10. The  pulmonic valve was normal in structure. Pulmonic valve regurgitation is trivial by color flow Doppler. 11. There is  mild dilatation of the ascending aorta measuring 40 mm. 12. Normal pulmonary artery systolic pressure. 13. The inferior vena cava is normal in size with greater than 50% respiratory variability, suggesting right atrial pressure of 3 mmHg.  Micro Data:  10/19 SARS CoV2 >> neg 10/19 MRSA PCR >> neg 10/19 RVP >> 10/20 trach asp >> 10/19 BCx 2>>  10/19 urine strep >> neg 10/19 urine legionella >>eg  Antimicrobials:  10/19 vanc >> 10/20 10/19 flagyl x1 10/19 cefepime >>   Subjective:  Rn reports no acute events overnight. Continues to have significant secretions. Tolerating SBT this morning. Net positive 1.5L in the last 24hrs  Objective:   Blood pressure 136/62, pulse 77, temperature 98.5 F (36.9 C), temperature source Oral, resp. rate 17, height  (1.473 m), weight 64.4 kg, SpO2 96 %.    Vent Mode: PSV;CPAP FiO2 (%):  [40 %] 40 % Set Rate:  [22 bmp] 22 bmp Vt Set:  [330 mL] 330 mL PEEP:  [5 cmH20] 5 cmH20 Pressure Support:  [5 cmH20] 5 cmH20 Plateau Pressure:  [14 cmH20-17 cmH20] 16 cmH20   Intake/Output Summary (Last 24 hours) at 04/24/2019 0981 Last data filed at 04/24/2019 0800 Gross per 24 hour  Intake 1905.54 ml  Output 655 ml  Net 1250.54 ml   Filed Weights   04/22/19 0500 04/23/19 0500 04/24/19 0500  Weight: 62.1 kg 63.6 kg 64.4 kg   Examination: General: Chronically ill appearing elderly female on mechanical ventilation, in NAD HEENT: ETT, MM pink/moist, PERRL,  Neuro: Alert and slightly agitated on wean this am with movement, no focal neuro deficits  CV: s1s2 regular rate and rhythm, no murmur, rubs, or gallops,  PULM:  Course rhonchi in all lung fields, thick yellow secretions suctioned from ETT GI: soft, bowel sounds active in all 4 quadrants, non-tender, non-distended Extremities: warm/dry, no edema  Skin: no rashes or lesions  Resolved Hospital Problem list   Septic shocks off pressors since 10/19  Assessment & Plan:   Acute Respiratory  Failure w/ hypoxia  PNA  - acute PE ruled out, CT concerning for post obstructive pneumonia vs aspiration P: Continue full vent support 8cc/kg Wean PEEP and FiO2 for sats greater then 92% Daily SBT trial, mentation precludes extubation Follow intermittent CXR and ABG Follow cultures  Descalate antibiotics to Ceftriaxone  Will need to follow up imagine after antibiotics to ensure clearance, r/o mass  PAD protocol; with fentanyl and Precedex   Foreign body - new on CXR, suspected dental bridge P:  Dental bridge removed with use of glidescope and [place]ills forceps 10/21  HTN Urgency On home Clonidine, Coreg, Losartan and HCTZ Highest BP in ED>>175/128mmHg P: Continue carvedilol with holding parameters  Resume home antihypertensives when BP allows   CHF Elevated troponin CAD H/o Dyslipidemia - no prior TTE - EKG with prolonged QT, LAFB, and LVH- no acute ischemic changes - Hs trop tren 279- > 790 - CT with evidence of calcifications, Echo with EF 40-45%, diastolic HF, mildly reduced RV function, no focal wall deficits P: Cardiology consulted, appreciate assistance  Heparin drip discontinued after 48hs Continue home ASA / statin If family wishes for aggressive care will need ischemic evaluation after critical illness resolves   NIDDM Previously on Metformin Pt BG >300 on presentation with AG Metabolic Acidosis + ketones on UA Betahydroxybutyrate 0.45 P: Continue SSI CBG checks ACHS Hypoglycemia protocol as  needed   Goals of care Given significant aspiration event with foreign body will plan to discuss goals of care with family regarding next steps.   Best practice:  Diet: NPO Pain/Anxiety/Delirium protocol (if indicated): RASS goal 0/-1, reduce fentanyl, add precedex  VAP protocol (if indicated): yes DVT prophylaxis: Subq heparin  GI prophylaxis: Protonix Q24h Glucose control: SSI Mobility: Bedrest Code Status: Full Family Communication: Albin Felling updated at bedside  on plan of care 10/21.   Pt is spanish speaking.  Daughters - [phone] is Lorna Few and Ronda Fairly [phone] Disposition: ICU  Labs   CBC: Recent Labs  Lab 04/06/2019 1545  04/22/19 0012 04/22/19 0223 04/23/19 0420 04/23/19 0556 04/24/19 0313  WBC 12.9*  --  12.8*  --   --  12.2* 9.6  NEUTROABS 10.1*  --   --   --   --   --   --   HGB 16.7*   < > 15.5* 15.3* 11.2* 12.6 11.1*  HCT 50.8*   < > 46.9* 45.0 33.0* 39.1 33.7*  MCV 93.6  --  92.1  --   --  94.0 93.1  PLT 301  --  266  --   --  200 162   < > = values in this interval not displayed.    Basic Metabolic Panel: Recent Labs  Lab 04/11/2019 1545 04/14/2019 1551 04/23/2019 2150 04/22/19 0215 04/22/19 0223 04/22/19 8938 04/22/19 1929 04/23/19 0420 04/23/19 0556  NA 135 137 135  141 140 141 143  --  142 141  K 5.5* 5.4* 5.0  3.9 3.3* 3.2* 3.8  --  3.2* 3.2*  CL 98 104 99 109  --  109  --   --  111  CO2 22  --  21* 18*  --  20*  --   --  23  GLUCOSE 262* 262* 189* 159*  --  113*  --   --  163*  BUN 21 30* 24* 24*  --  22  --   --  26*  CREATININE 1.05* 1.00 1.11* 1.05*  --  0.94  --   --  0.93  CALCIUM 10.1  --  8.9 9.7  --  8.6*  --   --  8.1*  MG  --   --   --  1.4*  --  2.1 2.8*  --  2.2  PHOS  --   --   --   --   --   --   --   --  1.0*   GFR: Estimated Creatinine Clearance: 35.1 mL/min (by C-G formula based on SCr of 0.93 mg/dL). Recent Labs  Lab 04/23/2019 1542 04/28/2019 1545 04/07/2019 1937 04/22/19 0012 04/22/19 0448 04/22/19 0653 04/23/19 0556 04/24/19 0313  WBC  --  12.9*  --  12.8*  --   --  12.2* 9.6  LATICACIDVEN 4.0*  --  3.3*  --  3.2* 2.8*  --   --     Liver Function Tests: Recent Labs  Lab 04/12/2019 1545 04/29/2019 2150 04/22/19 0215  AST 50* 52* 34  ALT 20 19 19   ALKPHOS 78 74 63  BILITOT 1.7* 1.6* 1.0  PROT 7.7 7.1 6.2*  ALBUMIN 4.0 3.5 2.8*   Recent Labs  Lab 04/29/2019 1545  LIPASE 16   No results for input(s): AMMONIA in the last 168 hours.  ABG    Component Value  Date/Time   PHART 7.417 04/23/2019 0420   PCO2ART 36.4 04/23/2019 0420   PO2ART 119.0 (H)  04/23/2019 0420   HCO3 23.5 04/23/2019 0420   TCO2 25 04/23/2019 0420   ACIDBASEDEF 1.0 04/23/2019 0420   O2SAT 99.0 04/23/2019 0420     Coagulation Profile: No results for input(s): INR, PROTIME in the last 168 hours.  Cardiac Enzymes: No results for input(s): CKTOTAL, CKMB, CKMBINDEX, TROPONINI in the last 168 hours.  HbA1C: Hgb A1c MFr Bld  Date/Time Value Ref Range Status  04/22/2019 12:12 AM 7.4 (H) 4.8 - 5.6 % Final    Comment:    (NOTE) Pre diabetes:          5.7%-6.4% Diabetes:              >6.4% Glycemic control for   <7.0% adults with diabetes   07/10/2018 01:04 PM 7.2 (H) <5.7 % of total Hgb Final    Comment:    For someone without known diabetes, a hemoglobin A1c value of 6.5% or greater indicates that they may have  diabetes and this should be confirmed with a follow-up  test. . For someone with known diabetes, a value <7% indicates  that their diabetes is well controlled and a value  greater than or equal to 7% indicates suboptimal  control. A1c targets should be individualized based on  duration of diabetes, age, comorbid conditions, and  other considerations. . Currently, no consensus exists regarding use of hemoglobin A1c for diagnosis of diabetes for children. .    CBG: Recent Labs  Lab 04/23/19 1601 04/23/19 1949 04/23/19 2342 04/24/19 0427 04/24/19 0805  GLUCAP 118* 130* 171* 180* 224*      Signature    CRITICAL CARE Performed by: Delfin GantWhitney F Davis   Total critical care time: 40 minutes  Critical care time was exclusive of separately billable procedures and treating other patients.  Critical care was necessary to treat or prevent imminent or life-threatening deterioration.  Critical care was time spent personally by me on the following activities: development of treatment plan with patient and/or surrogate as well as nursing, discussions  with consultants, evaluation of patient's response to treatment, examination of patient, obtaining history from patient or surrogate, ordering and performing treatments and interventions, ordering and review of laboratory studies, ordering and review of radiographic studies, pulse oximetry and re-evaluation of patient's condition.  Delfin GantWhitney F Davis, NP-C [facility] Contact information can be found on Amion  After hours pager: [phone]. 04/24/2019, 9:58 AM  Attending Note:  85 year old female with of aspiration presenting with aspiration pneumonia induced respiratory failure.  Overnight, intermittent agitation on little fentanyl and precedex at this point.  Yesterday we removed a dental bridge from her upper airway.  Historically she has been chocking on food and secretion.  I do not believe patient is going to be able to protect her airway at this point.  On exam, weaning but not awake enough to extubate.  I had an extensive conversation with both daughters over the phone individually.  Asked the question about extubation one way or tracheostomy as I do not believe patient will be able to protect her airway given the fact that she aspirated her own crown.  Awaiting call back from daughters today.  The patient is critically ill with multiple organ systems failure and requires high complexity decision making for assessment and support, frequent evaluation and titration of therapies, application of advanced monitoring technologies and extensive interpretation of multiple databases.   Critical Care Time devoted to patient care services described in this note is  40  Minutes.  This time reflects time of care of this signee Dr Koren Bound. This critical care time does not reflect procedure time, or teaching time or supervisory time of PA/NP/Med student/Med Resident etc but could involve care discussion time.  Alyson Reedy, M.D. [facility] Pulmonary/Critical Care Medicine. Pager:  [phone]. After hours pager: [phone].

## 2019-04-25 ENCOUNTER — Inpatient Hospital Stay (HOSPITAL_COMMUNITY): Payer: Medicare Other

## 2019-04-25 DIAGNOSIS — I639 Cerebral infarction, unspecified: Secondary | ICD-10-CM

## 2019-04-25 DIAGNOSIS — I6340 Cerebral infarction due to embolism of unspecified cerebral artery: Secondary | ICD-10-CM | POA: Insufficient documentation

## 2019-04-25 DIAGNOSIS — Z7189 Other specified counseling: Secondary | ICD-10-CM

## 2019-04-25 DIAGNOSIS — J80 Acute respiratory distress syndrome: Secondary | ICD-10-CM

## 2019-04-25 LAB — BASIC METABOLIC PANEL
Anion gap: 8 (ref 5–15)
BUN: 24 mg/dL — ABNORMAL HIGH (ref 8–23)
CO2: 23 mmol/L (ref 22–32)
Calcium: 8.3 mg/dL — ABNORMAL LOW (ref 8.9–10.3)
Chloride: 108 mmol/L (ref 98–111)
Creatinine, Ser: 0.65 mg/dL (ref 0.44–1.00)
GFR calc Af Amer: >60 mL/min (ref >60)
GFR calc non Af Amer: >60 mL/min (ref >60)
Glucose, Bld: 188 mg/dL — ABNORMAL HIGH (ref 70–99)
Potassium: 3.2 mmol/L — ABNORMAL LOW (ref 3.5–5.1)
Sodium: 139 mmol/L (ref 135–145)

## 2019-04-25 LAB — CULTURE, RESPIRATORY W GRAM STAIN

## 2019-04-25 LAB — GLUCOSE, CAPILLARY
Glucose-Capillary: 144 mg/dL — ABNORMAL HIGH (ref 70–99)
Glucose-Capillary: 171 mg/dL — ABNORMAL HIGH (ref 70–99)
Glucose-Capillary: 177 mg/dL — ABNORMAL HIGH (ref 70–99)
Glucose-Capillary: 179 mg/dL — ABNORMAL HIGH (ref 70–99)
Glucose-Capillary: 191 mg/dL — ABNORMAL HIGH (ref 70–99)
Glucose-Capillary: 207 mg/dL — ABNORMAL HIGH (ref 70–99)

## 2019-04-25 LAB — CBC
HCT: 35.6 % — ABNORMAL LOW (ref 36.0–46.0)
Hemoglobin: 11.4 g/dL — ABNORMAL LOW (ref 12.0–15.0)
MCH: 29.9 pg (ref 26.0–34.0)
MCHC: 32 g/dL (ref 30.0–36.0)
MCV: 93.4 fL (ref 80.0–100.0)
Platelets: 170 10*3/uL (ref 150–400)
RBC: 3.81 MIL/uL — ABNORMAL LOW (ref 3.87–5.11)
RDW: 14.3 % (ref 11.5–15.5)
WBC: 10.4 10*3/uL (ref 4.0–10.5)
nRBC: 0.2 % (ref 0.0–0.2)

## 2019-04-25 MED ORDER — CLOPIDOGREL BISULFATE 75 MG PO TABS
75.0000 mg | ORAL_TABLET | Freq: Every day | ORAL | Status: DC
  Filled 2019-04-25: qty 1

## 2019-04-25 MED ORDER — IOHEXOL 350 MG/ML SOLN
100.0000 mL | Freq: Once | INTRAVENOUS | Status: AC | PRN
  Administered 2019-04-25: 100 mL via INTRAVENOUS

## 2019-04-25 MED ORDER — SODIUM CHLORIDE 0.9 % IV SOLN
INTRAVENOUS | Status: DC | PRN
  Administered 2019-04-25: 08:00:00 via INTRAVENOUS

## 2019-04-25 MED ORDER — CLOPIDOGREL BISULFATE 75 MG PO TABS
75.0000 mg | ORAL_TABLET | Freq: Every day | ORAL | Status: DC
  Administered 2019-04-25 – 2019-04-26 (×2): 75 mg
  Filled 2019-04-25: qty 1

## 2019-04-25 MED ORDER — POTASSIUM CHLORIDE 10 MEQ/100ML IV SOLN
10.0000 meq | INTRAVENOUS | Status: AC
  Administered 2019-04-25 (×5): 10 meq via INTRAVENOUS
  Filled 2019-04-25 (×5): qty 100

## 2019-04-25 NOTE — Progress Notes (Addendum)
NAME:  Alison Graves, MRN:  [id number], DOB:  Mar 20, 1932, LOS: 4 ADMISSION DATE:  May 08, 2019, CONSULTATION DATE:  05-08-19 REFERRING MD:  Wilson Singer MD, CHIEF COMPLAINT:  SOB and Abdominal Pain   Brief History   - Spanish speaking female, understands very little [place]-   85 yr old F w/ PMHx sig for DM and HTN presented on 10/19 to urgent care with her daughter for SOB found to be hypoxic and hypertensive requiring intubation for respiratory failure.  Additionally found to have elevated troponin and cardiology consulted.   During visit with patient daughter noticed that patient was not moving right side, at this time nursign repeated assessment and right sided paraplegia was seen. This prompted a code stroke to be initiated. LKN was unknown but per nursing patient had no neuro deficits on exam at 4pm. Head CT revealed late acute to early subacute left MCA stroke. Neurology was consulted and family was notified  Past Medical History   Active Ambulatory Problems    Diagnosis Date Noted  . Dyslipidemia 11/29/2006  . HTN (hypertension) 11/20/2006  . HEMORRHOIDS 07/25/2006  . MENOPAUSE-RELATED VASOMOTOR SYMPTOMS 04/19/2009  . Osteoarthritis of both knees 12/16/2008  . Polymyalgia rheumatica ([place]) 01/10/2008  . Encounter for intubation 10/04/2010  . Osteopenia 10/04/2010  . Anxiety 11/16/2011  . Edema 02/17/2015  . PCP NOTES >>>>>>>>>>>>>>>>>>>>>>>>> 09/26/2015  . Chondrocalcinosis 04/24/2016  . Osteoarthritis of patellofemoral joints of both knees 04/24/2016  . Osteoarthritis of both hands 04/24/2016  . Osteoarthritis of feet, bilateral 04/24/2016  . Diabetes mellitus, type II ([place]) 01/25/2017  . Increased anion gap metabolic acidosis   . Respiratory acidosis   . Multilobar lung infiltrate    Resolved Ambulatory Problems    Diagnosis Date Noted  . WOUND, HAND 04/21/2010  . CHEST PAIN, ATYPICAL 08/26/2010  . HTN (hypertension)   . Bronchitis 07/12/2012   Past Medical History:   Diagnosis Date  . Diabetes mellitus without complication ([place])   . Hemorrhoids   . Hyperlipemia   . Microhematuria   . Osteoarthritis   . PMR (polymyalgia rheumatica) ([place])    Significant Hospital Events   10/19 Admitted/ intubated 10/20 off vasopressors   Consults:  10/19 Cardiology  10/23 Neurology   Procedures:  May 08, 2019>> ETT  Significant Diagnostic Tests:  10/19 CTA PE / CT A/P >> Negative for acute PE, extensive bilateral multifocal consolidation concerning fior multifocal pneumonia. Cardiac enlargment with bilateral pleural effusions consisted with CHF  10/20 TTE >> EF of 40-45% with evidence of impaired relaxation   Micro Data:  10/19 SARS CoV2 >> neg 10/19 MRSA PCR >> neg 10/19 RVP >> 10/20 trach asp >> 10/19 BCx 2>>  10/19 urine strep >> neg 10/19 urine legionella >>eg  Antimicrobials:  10/19 vanc >> 10/20 10/19 flagyl x1 10/19 cefepime >>   Subjective:  Newly developed left paraplegia seen overnight resulting in code stoke with positive finding of right CVA. Secretions appear improved compared to yesterday. Good urine output, net positive 550ml in the last 24hrs   Objective:   Blood pressure (!) 146/78, pulse 76, temperature 98.1 F (36.7 C), temperature source Oral, resp. rate 16, height 4\' 10"  (1.473 m), weight 65.5 kg, SpO2 98 %.    Vent Mode: PSV;CPAP FiO2 (%):  [0.8 %-40 %] 40 % Set Rate:  [22 bmp] 22 bmp Vt Set:  [330 mL] 330 mL PEEP:  [5 cmH20] 5 cmH20 Pressure Support:  [5 cmH20] 5 cmH20 Plateau Pressure:  [11 cmH20-13 cmH20] 13 cmH20  Intake/Output Summary (Last 24 hours) at 04/25/2019 5465 Last data filed at 04/25/2019 0700 Gross per 24 hour  Intake 2103.48 ml  Output 1435 ml  Net 668.48 ml   Filed Weights   04/23/19 0500 04/24/19 0500 04/25/19 0500  Weight: 63.6 kg 64.4 kg 65.5 kg   Examination: General: Chronically ill appearing elderly female on mechanical ventilation, in NAD HEENT: ETT, MM pink/moist, PERRL,  Neuro:  Opens eyes to verbal stimulation, spontaneous movement seen on left side, no pspontaneous or purposeful movement to pain on right CV: s1s2 regular rate and rhythm, no murmur, rubs, or gallops,  PULM:  Mild rhonchi bilaterally, improved from day prior, tolerating wean currently GI: soft, bowel sounds active in all 4 quadrants, non-tender, non-distended, tolerating TF Extremities: warm/dry, generalized edema worse in upper extremities  Skin: no rashes or lesions  Resolved Hospital Problem list   Septic shocks off pressors since 10/19  Assessment & Plan:   Acute Respiratory Failure w/ hypoxia PNA  - acute PE ruled out, CT concerning for post obstructive pneumonia vs aspiration P: Remain on full vent support, 8cc/kg Wean PEEP and FiO2 for sats greater than 92% Daily SBT trail, mentation precludes extubation  Follow intermittent CRX and ABG Continue IV antibiotics  PAD protocol with fentanyl and Precedex   Foreign body - new on CXR, suspected dental bridge P:  Dental bridge removed with use of glidescope and [place] forcepts 10/21  HTN Urgency On home Clonidine, Coreg, Losartan and HCTZ Highest BP in ED>>175/142mmHg P: Continue Carvediol with holding parameters  Resume home antihypertensives when BO allows   CHF Elevated troponin CAD H/o Dyslipidemia - no prior TTE - EKG with prolonged QT, LAFB, and LVH- no acute ischemic changes - Hs trop tren 279- > 790 - CT with evidence of calcifications, Echo with EF 40-45%, diastolic HF, mildly reduced RV function, no focal wall deficits P: Cardiology following, appreciate assistance  Received Heparin drip for 48hrs Continue home ASA and statin  If family wishes for aggressive care will need ischemic evaluation after critical illness resolves   NIDDM Previously on Metformin Pt BG >300 on presentation with AG Metabolic Acidosis + ketones on UA Betahydroxybutyrate 0.45 P: Continue SSI CBG checks ACHS Hypoglycemia protocol as  needed   Goals of care Daughter updated by attending 10/22 regarding goals of care given significant aspiration with foreign body removal as well. Patient has also now suffered large MCA stroke.    Best practice:  Diet: NPO Pain/Anxiety/Delirium protocol (if indicated): RASS goal 0/-1, reduce fentanyl, add precedex  VAP protocol (if indicated): yes DVT prophylaxis: Subq heparin  GI prophylaxis: Protonix Q24h Glucose control: SSI Mobility: Bedrest Code Status: Full Family Communication: Albin Felling updated at bedside on plan of care 10/21.   Pt is spanish speaking.  Daughters - [phone] is Lorna Few and Ronda Fairly [phone] Disposition: ICU  Labs   CBC: Recent Labs  Lab 04-27-19 1545  04/22/19 0012 04/22/19 0223 04/23/19 0420 04/23/19 0556 04/24/19 0313 04/25/19 0421  WBC 12.9*  --  12.8*  --   --  12.2* 9.6 10.4  NEUTROABS 10.1*  --   --   --   --   --   --   --   HGB 16.7*   < > 15.5* 15.3* 11.2* 12.6 11.1* 11.4*  HCT 50.8*   < > 46.9* 45.0 33.0* 39.1 33.7* 35.6*  MCV 93.6  --  92.1  --   --  94.0 93.1 93.4  PLT 301  --  266  --   --  200 162 170   < > = values in this interval not displayed.    Basic Metabolic Panel: Recent Labs  Lab 04/22/19 0215  04/22/19 0653 04/22/19 1929 04/23/19 0420 04/23/19 0556 04/24/19 1007 04/25/19 0421  NA 140   < > 143  --  142 141 140 139  K 3.3*   < > 3.8  --  3.2* 3.2* 3.8 3.2*  CL 109  --  109  --   --  111 111 108  CO2 18*  --  20*  --   --  23 21* 23  GLUCOSE 159*  --  113*  --   --  163* 260* 188*  BUN 24*  --  22  --   --  26* 31* 24*  CREATININE 1.05*  --  0.94  --   --  0.93 0.78 0.65  CALCIUM 9.7  --  8.6*  --   --  8.1* 8.2* 8.3*  MG 1.4*  --  2.1 2.8*  --  2.2  --   --   PHOS  --   --   --   --   --  1.0*  --   --    < > = values in this interval not displayed.   GFR: Estimated Creatinine Clearance: 41.1 mL/min (by C-G formula based on SCr of 0.65 mg/dL). Recent Labs  Lab 05/15/19 1542  2019/05/15 1937  04/22/19 0012 04/22/19 0448 04/22/19 9147 04/23/19 0556 04/24/19 0313 04/25/19 0421  WBC  --    < >  --  12.8*  --   --  12.2* 9.6 10.4  LATICACIDVEN 4.0*  --  3.3*  --  3.2* 2.8*  --   --   --    < > = values in this interval not displayed.    Liver Function Tests: Recent Labs  Lab 05-15-19 1545 05-15-2019 2150 04/22/19 0215  AST 50* 52* 34  ALT ALKPHOS 78 74 63  BILITOT 1.7* 1.6* 1.0  PROT 7.7 7.1 6.2*  ALBUMIN 4.0 3.5 2.8*   Recent Labs  Lab 2019/05/15 1545  LIPASE 16   No results for input(s): AMMONIA in the last 168 hours.  ABG    Component Value Date/Time   PHART 7.417 04/23/2019 0420   PCO2ART 36.4 04/23/2019 0420   PO2ART 119.0 (H) 04/23/2019 0420   HCO3 23.5 04/23/2019 0420   TCO2 25 04/23/2019 0420   ACIDBASEDEF 1.0 04/23/2019 0420   O2SAT 99.0 04/23/2019 0420     Coagulation Profile: No results for input(s): INR, PROTIME in the last 168 hours.  Cardiac Enzymes: No results for input(s): CKTOTAL, CKMB, CKMBINDEX, TROPONINI in the last 168 hours.  HbA1C: Hgb A1c MFr Bld  Date/Time Value Ref Range Status  04/22/2019 12:12 AM 7.4 (H) 4.8 - 5.6 % Final    Comment:    (NOTE) Pre diabetes:          5.7%-6.4% Diabetes:              >6.4% Glycemic control for   <7.0% adults with diabetes   07/10/2018 01:04 PM 7.2 (H) <5.7 % of total Hgb Final    Comment:    For someone without known diabetes, a hemoglobin A1c value of 6.5% or greater indicates that they may have  diabetes and this should be confirmed with a follow-up  test. . For someone with known diabetes, a value <7% indicates  that  their diabetes is well controlled and a value  greater than or equal to 7% indicates suboptimal  control. A1c targets should be individualized based on  duration of diabetes, age, comorbid conditions, and  other considerations. . Currently, no consensus exists regarding use of hemoglobin A1c for diagnosis of diabetes for children. .    CBG: Recent  Labs  Lab 04/24/19 1546 04/24/19 2012 04/24/19 2347 04/25/19 0404 04/25/19 0821  GLUCAP 206* 181* 208* 177* 207*      Signature    CRITICAL CARE Performed by: Delfin GantWhitney F Davis   Total critical care time: 45 minutes  Critical care time was exclusive of separately billable procedures and treating other patients.  Critical care was necessary to treat or prevent imminent or life-threatening deterioration.  Critical care was time spent personally by me on the following activities: development of treatment plan with patient and/or surrogate as well as nursing, discussions with consultants, evaluation of patient's response to treatment, examination of patient, obtaining history from patient or surrogate, ordering and performing treatments and interventions, ordering and review of laboratory studies, ordering and review of radiographic studies, pulse oximetry and re-evaluation of patient's condition.  Delfin GantWhitney F Davis, NP-C [facility] Contact information can be found on Amion  After hours pager: [phone]. 04/25/2019, 8:29 AM  Attending Note:  85 year old female with aspiration pneumonia and respiratory failure.  Overnight, patient developed a subacute stroke seen on CT after a code stroke was called.  On exam, not following commands and not moving the right side with coarse BS diffusely.  I reviewed CXR myself, ETT ok and infiltrate noted.  CT of the head with L MCA CVA noted.  Discussed with PCCM-NP.  Extensive conversation with the daughters bedside.  They are upset that the stroke was not caught earlier and that topic was addressed.  After a very long discussion decision was made that trach/peg were not appropriate for patient.  DNR status was established.  Son is [place] from [place] of the country today afternoon then when family is done visiting then will proceed with comfort measures.  PCCM will continue to manage.  The patient is critically ill with multiple  organ systems failure and requires high complexity decision making for assessment and support, frequent evaluation and titration of therapies, application of advanced monitoring technologies and extensive interpretation of multiple databases.   Critical Care Time devoted to patient care services described in this note is  60  Minutes. This time reflects time of care of this signee Dr Koren BoundWesam . This critical care time does not reflect procedure time, or teaching time or supervisory time of PA/NP/Med student/Med Resident etc but could involve care discussion time.  Alyson ReedyWesam G. , M.D. [facility].

## 2019-04-25 NOTE — Progress Notes (Signed)
Responded to Chaplain referral to meet with Ms. Alison Graves about her rosary being broken.  Ms Alison Graves not responsive.  Initiated relationship with her daughter.  In lieu of a rosary, presented a crocheted cross to Ms. Alison Graves.  The daughter was pleased her laid it on her mother's chest.  Offered prayer at bedside.  Chaplain's pleased to respond at any time the family needs Alison Graves.  De Burrs Chaplain Resident Pager:  [phone]

## 2019-04-25 NOTE — Progress Notes (Signed)
[facility] Progress Note Patient Name: Alison Graves DOB: [birth year] MRN: [id number]   Date of Service  04/25/2019  HPI/Events of Note  Nurse called to advise me that patient was not moving her right side, on camera in right hemiplegia with associated right sided neglect was confirmed.  eICU Interventions  Code stroke was immediately called. Pt transported to CT for stat non-contrast head CT which confirmed large MCA territory CVA, Dr. Rory Percy consulted and following.        Leshawn Straka U Kailer Heindel 04/25/2019, 2:30 AM

## 2019-04-25 NOTE — Code Documentation (Addendum)
Called to evaluate pt for possible stroke. Pt NIH-2 for orientation, L gaze preference, R complete hemianopia, R sided paralysis, and aphasia. CBG-208. LSN-unknown. Pt has been sedated on ventilator since 10/29. Code stroke activated and pt taken to CT. CT head-1. Evolving moderate-sized acute to early subacute left MCA territory infarct as above. No intracranial hemorrhage. 2. Additional smaller evolving acute to early subacute right frontal cortical infarct. CTA-no LVO, CTP-Acute to subacute left larger than right MCA territory infarcts. No surrounding ischemic penumbra evident by CT perfusion.. TPA not given d/t unknown LSN. Pt transported back to 3M12 with RT and RN.

## 2019-04-25 NOTE — Consult Note (Signed)
Neurology Consultation  Reason for Consult: Code stroke for right-sided weakness Referring Physician: Dr. Janalee Dane -PCCM  CC: Right-sided weakness, inability to follow commands  History is obtained from: Patient's RN at bedside, chart  HPI: Alison Graves is a 85 y.o. female past medical history of diabetes, hypertension, hyperlipidemia, polymyalgia rheumatica, admitted to the hospital on 04/30/2019 via urgent care due to increasing shortness of breath, found to be hypoxic and hypotensive requiring intubation for respiratory failure.  Also was found to have elevated troponins. She is admitted to the critical care service with cardiology following. It was noted at some point this evening by her daughter when she was visiting during the visiting hours that she was not moving the right side.  She called the patient's nurse to 9 asking if that was to be expected are normal.  The nurse repeated her exam and called a code stroke because the patient was weak on the right side as well as was not following commands. It is unclear, when her last known normal was although the current RN reports that the last check at 4 PM reported no focal deficits. On admission, She had troponinemia, was on heparin gtt which was discontinued last evening as EKG showed no ischemic changes and echo did not show any WMA as well as no clinical complaints of chest discomfort. Chest imaging showed extensive b/l multifocal airway disease without any evidence of PE. B/L pleural effusions. On admission - COVID -ve  LKW: Unknown tpa given?: no, unknown last normal, establish stroke on the CT Premorbid modified Rankin scale (mRS): Unknown.  Family not at bedside.  DVV:OHYWVP to obtain due to altered mental status/intubation  Past Medical History:  Diagnosis Date  . Diabetes mellitus without complication ([place])   . Hemorrhoids   . HTN (hypertension)   . Hyperlipemia   . Microhematuria    s/p eval DR Tannenbaum 2004  .  Osteoarthritis   . PMR (polymyalgia rheumatica) (HCC)    ?    Family History  Problem Relation Age of Onset  . Coronary artery disease Mother        early in life   . Diabetes Other        grandmother  . Stroke Neg Hx   . Hypertension Neg Hx   . Cancer Neg Hx    Social History:   reports that she has never smoked. She has never used smokeless tobacco. She reports that she does not drink alcohol or use drugs.  Medications  Current Facility-Administered Medications:  .  0.9 %  sodium chloride infusion, , Intravenous, PRN, Scatliffe, Kristen D, MD, Last Rate: 10 mL/hr at 04/25/19 0000 .  aspirin chewable tablet 81 mg, 81 mg, Per Tube, Daily, Skeet Latch, MD, 81 mg at 04/24/19 0817 .  atorvastatin (LIPITOR) tablet 40 mg, 40 mg, Oral, q1800, Scatliffe, Kristen D, MD, 40 mg at 04/24/19 1631 .  carvedilol (COREG) tablet 3.125 mg, 3.125 mg, Per Tube, BID WC, Simpson, Paula B, NP, 3.125 mg at 04/24/19 1630 .  cefTRIAXone (ROCEPHIN) 1 g in sodium chloride 0.9 % 100 mL IVPB, 1 g, Intravenous, Q24H, Merlene Laughter F, NP, Stopped at 04/24/19 1705 .  chlorhexidine gluconate (MEDLINE KIT) (PERIDEX) 0.12 % solution 15 mL, 15 mL, Mouth Rinse, BID, Scatliffe, Kristen D, MD, 15 mL at 04/24/19 2039 .  Chlorhexidine Gluconate Cloth 2 % PADS 6 each, 6 each, Topical, Daily, Rush Farmer, MD, 6 each at 04/24/19 [phone] .  dexmedetomidine (PRECEDEX) 400 MCG/100ML (4 mcg/mL)  infusion, 0.4-1.2 mcg/kg/hr, Intravenous, Titrated, Jennelle Human B, NP, Last Rate: 19.08 mL/hr at 04/25/19 0000, 1.2 mcg/kg/hr at 04/25/19 0000 .  enoxaparin (LOVENOX) injection 40 mg, 40 mg, Subcutaneous, Q24H, Merlene Laughter F, NP, 40 mg at 04/24/19 1014 .  feeding supplement (VITAL HIGH PROTEIN) liquid 1,000 mL, 1,000 mL, Per Tube, Q24H, Rush Farmer, MD, 1,000 mL at 04/24/19 1044 .  fentaNYL (SUBLIMAZE) bolus via infusion 25 mcg, 25 mcg, Intravenous, Q15 min PRN, Scatliffe, Kristen D, MD, 25 mcg at 04/25/19 0040 .  fentaNYL  2543mg in NS 2554m(1053mml) infusion-PREMIX, 0-400 mcg/hr, Intravenous, Continuous, YacRush FarmerD, Last Rate: 12.5 mL/hr at 04/25/19 0000, 125 mcg/hr at 04/25/19 0000 .  gabapentin (NEURONTIN) 250 MG/5ML solution 100 mg, 100 mg, Per Tube, Q8H, Scatliffe, Kristen D, MD, 100 mg at 04/24/19 2254 .  guaiFENesin (ROBITUSSIN) 100 MG/5ML solution 100 mg, 5 mL, Per Tube, Q4H PRN, DavMerlene Laughter NP, 100 mg at 04/24/19 2254 .  insulin aspart (novoLOG) injection 0-9 Units, 0-9 Units, Subcutaneous, Q4H, HofCorey HaroldP, 3 Units at 04/25/19 0030 .  ipratropium-albuterol (DUONEB) 0.5-2.5 (3) MG/3ML nebulizer solution 3 mL, 3 mL, Nebulization, Q6H PRN, Scatliffe, Kristen D, MD .  MEDLINE mouth rinse, 15 mL, Mouth Rinse, 10 times per day, Scatliffe, Kristen D, MD, 15 mL at 04/25/19 0030 .  pantoprazole (PROTONIX) injection 40 mg, 40 mg, Intravenous, QHS, Scatliffe, Kristen D, MD, 40 mg at 04/24/19 2254 .  polyethylene glycol (MIRALAX / GLYCOLAX) packet 17 g, 17 g, Per Tube, Daily PRN, YacRush FarmerD, 17 g at 04/24/19 1038  Exam: Current vital signs: BP 136/65   Pulse 89   Temp 98.3 F (36.8 C) (Oral)   Resp (!) 22   Ht _0  (1.473 m)   Wt 64.4 kg   SpO2 97%   BMI 29.67 kg/m  Vital signs in last 24 hours: Temp:  [98.3 F (36.8 C)-100.1 F (37.8 C)] 98.3 F (36.8 C) (10/23 0000) Pulse Rate:  [72-139] 89 (10/23 0016) Resp:  [17-32] 22 (10/23 0000) BP: (87-192)/(35-96) 136/65 (10/23 0000) SpO2:  [94 %-100 %] 97 % (10/23 0016) FiO2 (%):  [0.8 %-40 %] 0.8 % (10/23 0016) Weight:  [64.4 kg] 64.4 kg (10/22 0500) General: Sedated on fentanyl drip and Precedex, eyes open.  Appears in no distress. HEENT: Normocephalic atraumatic, endotracheal tube in place Lungs: Saturating well on the vent Cardiovascular: Regular rate rhythm Abdomen: Soft nondistended nontender Extremities: Warm well perfused Neurological exam She has sedated on Precedex drip and fentanyl drip but has her eyes  open. According to the RN, she is predominantly Spanish-speaking. She did not follow any commands in Spanish as well as EngVanuaturanial nerves: She has left gaze preference, she does not blink to threat from either side-responses have been inconsistent, difficult ascertain facial symmetry due to the 2. Motor exam: She actively withdraws left upper and lower extremity noxious immolation.  She is plegic on the right upper extremity.  Severely paretic on the right lower extremity. Sensory exam: Upon noxious stimulation to the left, there is partial withdrawal.  Upon noxious stimulation to the right arm, she attempts to localize with the left.  No obvious sensory deficit. Coordination cannot be tested and neither can gait due to the current mentation and intubated status. NIHSS 1a Level of Conscious.: 1 1b LOC Questions: 2 1c LOC Commands: 2 2 Best Gaze: 1 3 Visual: 0 4 Facial Palsy: 0 5a Motor Arm - left: 0 5b Motor Arm -  Right: 3 6a Motor Leg - Left: 0 6b Motor Leg - Right: 2 7 Limb Ataxia: 0 8 Sensory: 0 9 Best Language: 3 10 Dysarthria: 2 11 Extinct. and Inatten.: 1 TOTAL: 17  Labs I have reviewed labs in epic and the results pertinent to this consultation are:  CBC    Component Value Date/Time   WBC 9.6 04/24/2019 0313   RBC 3.62 (L) 04/24/2019 0313   HGB 11.1 (L) 04/24/2019 0313   HCT 33.7 (L) 04/24/2019 0313   PLT 162 04/24/2019 0313   MCV 93.1 04/24/2019 0313   MCH 30.7 04/24/2019 0313   MCHC 32.9 04/24/2019 0313   RDW 14.6 04/24/2019 0313   LYMPHSABS 2.2 04/17/2019 1545   MONOABS 0.5 04/08/2019 1545   EOSABS 0.0 04/08/2019 1545   BASOSABS 0.1 04/30/2019 1545    CMP     Component Value Date/Time   NA 140 04/24/2019 1007   NA 134 (A) 02/16/2016   K 3.8 04/24/2019 1007   CL 111 04/24/2019 1007   CO2 21 (L) 04/24/2019 1007   GLUCOSE 260 (H) 04/24/2019 1007   GLUCOSE 96 05/11/2006 0932   BUN 31 (H) 04/24/2019 1007   BUN 24 (A) 02/16/2016   CREATININE 0.78  04/24/2019 1007   CREATININE 0.94 (H) 09/24/2015 1636   CALCIUM 8.2 (L) 04/24/2019 1007   PROT 6.2 (L) 04/22/2019 0215   ALBUMIN 2.8 (L) 04/22/2019 0215   AST 34 04/22/2019 0215   ALT 19 04/22/2019 0215   ALKPHOS 63 04/22/2019 0215   BILITOT 1.0 04/22/2019 0215   GFRNONAA >60 04/24/2019 1007   GFRAA >60 04/24/2019 1007   Imaging I have reviewed the images obtained: CT-scan of the brain-late acute to early subacute-looking left MCA stroke. No evidence of bleed. CTA head and neck with no ELVO.left V4 athero vs short dissection, small RICA pseudoaneurysm CTP with no perfusion asymmetry  Assessment: 85 year old woman with above past medical history, admitted for shortness of breath and bradycardia with PCCM and cardiology following noted to have right-sided weakness and left gaze with an unknown last known normal. CT head reveals a late acute to early subacute-looking left MCA infarct, which can explain her current clinical picture. Echocardiogram done during this admission showed LVEF of 40 to 45%  Could be a cardioembolic acute ischemic stroke.  Outside the window for IV TPA due to completely established stroke on imaging and unknown last normal.   Not an intervention candidate due to no ELVO on CTA   Impression: Acute/Subacute stroke in the LMCA territory  Recommendations: -C/w Telemetry -Frequent neurochecks -Consider repeat echo -Check A1c, Lipid panel -PT OT ST when able -Management of current medical issues per PCCM as you are -c/w ASA and statin -Might need anticoagulation if Afib seen on tele or if there is evidence of cardiac thrombus  Plan d/w Dr. Janalee Dane over the phone.  Stroke team will follow.  Please page stroke NP/PA/MD (listed on AMION)  from 8am-4 pm as this patient will be followed by the stroke team at this point.  -- Amie Portland, MD [facility] Pager: [phone] If 7pm to 7am, please call on call as listed on AMION.  CRITICAL CARE  ATTESTATION Performed by: Amie Portland, MD Total critical care time: 55 minutes Critical care time was exclusive of separately billable procedures and treating other patients and/or supervising APPs/Residents/Students Critical care was necessary to treat or prevent imminent or life-threatening deterioration due to acute ischemic stroke  This patient is critically ill and at significant  risk for neurological worsening and/or death and care requires constant monitoring. Critical care was time spent personally by me on the following activities: development of treatment plan with patient and/or surrogate as well as nursing, discussions with consultants, evaluation of patient's response to treatment, examination of patient, obtaining history from patient or surrogate, ordering and performing treatments and interventions, ordering and review of laboratory studies, ordering and review of radiographic studies, pulse oximetry, re-evaluation of patient's condition, participation in multidisciplinary rounds and medical decision making of high complexity in the care of this patient.

## 2019-04-25 NOTE — Progress Notes (Signed)
[place] Progress Note Patient Name: Alison Graves DOB: [birth year] MRN: [id number]   Date of Service  04/25/2019  HPI/Events of Note  I called patient's  daughter Angela Nevin  to update her regarding her mother's stroke and she said her sister Tito Dine would call me back. Tito Dine called me back and I explained that her mother had had a stroke and the timing was sometime between when her sister noticed that her mom was not moving her right side and a little while before. She was understandably upset.  I answered all her questions to the best of my ability.  eICU Interventions  Communicated with patient's daughter Tito Dine.        Kerry Kass Ogan 04/25/2019, 3:48 AM

## 2019-04-25 NOTE — Progress Notes (Signed)
Received call from patient's daughter to check for an update on her. During conversation she states that she forgot to tell me that the patient had decreased movement on right side during her visit and asked if the patient had a stroke? After phone conversation, further assessed patient for symptoms of stroke. Noticed that patient was not moving right side to pain and only moving left- also not blinking to threat on the right side. Call placed to [facility] and Rapid Response RN for possible code stroke. See Rapid Response and Neurology note.   Milford Cage, RN

## 2019-04-25 NOTE — Progress Notes (Signed)
PT transported to CT and back with no complications 

## 2019-04-25 NOTE — Plan of Care (Signed)
RN and daughter at bedside. I also talked with another daughter on the speaker phone. Pt obtunded, still intubated with right hemiplegia. CT showed left MCA moderate infarct. CCM discussed with family and plan for comfort care after son visits patient this pm or evening.   Discussed with Dr. Nelda Marseille over the phone. Dr. Nelda Marseille confirmed comfort care measure after son visitation. He will call us back for further questions or need further stroke work up in the future. We will sign off for now. Please call us back if needed.   Rosalin Hawking, MD PhD Stroke Neurology 04/25/2019 12:18 PM

## 2019-04-26 DIAGNOSIS — J9601 Acute respiratory failure with hypoxia: Secondary | ICD-10-CM | POA: Diagnosis not present

## 2019-04-26 DIAGNOSIS — G9340 Encephalopathy, unspecified: Secondary | ICD-10-CM | POA: Diagnosis not present

## 2019-04-26 DIAGNOSIS — Z01818 Encounter for other preprocedural examination: Secondary | ICD-10-CM | POA: Diagnosis not present

## 2019-04-26 DIAGNOSIS — I249 Acute ischemic heart disease, unspecified: Secondary | ICD-10-CM | POA: Diagnosis not present

## 2019-04-26 DIAGNOSIS — J810 Acute pulmonary edema: Secondary | ICD-10-CM | POA: Diagnosis not present

## 2019-04-26 DIAGNOSIS — I5021 Acute systolic (congestive) heart failure: Secondary | ICD-10-CM

## 2019-04-26 LAB — GLUCOSE, CAPILLARY
Glucose-Capillary: 154 mg/dL — ABNORMAL HIGH (ref 70–99)
Glucose-Capillary: 170 mg/dL — ABNORMAL HIGH (ref 70–99)
Glucose-Capillary: 140 mg/dL — ABNORMAL HIGH (ref 70–99)

## 2019-04-26 LAB — CULTURE, BLOOD (ROUTINE X 2)
Culture: NO GROWTH
Culture: NO GROWTH

## 2019-04-26 MED ORDER — LORAZEPAM 2 MG/ML IJ SOLN
2.0000 mg | INTRAMUSCULAR | Status: DC | PRN
  Administered 2019-04-26: 2 mg via INTRAVENOUS
  Filled 2019-04-26: qty 1

## 2019-04-26 MED ORDER — GLYCOPYRROLATE 1 MG PO TABS: 1.0000 mg | ORAL_TABLET | ORAL | Status: DC | PRN

## 2019-04-26 MED ORDER — GLYCOPYRROLATE 0.2 MG/ML IJ SOLN
0.2000 mg | INTRAMUSCULAR | Status: DC | PRN
  Administered 2019-04-26: 0.2 mg via INTRAVENOUS
  Filled 2019-04-26: qty 1

## 2019-04-26 MED ORDER — ACETAMINOPHEN 650 MG RE SUPP: 650.0000 mg | Freq: Four times a day (QID) | RECTAL | Status: DC | PRN

## 2019-04-26 MED ORDER — POLYVINYL ALCOHOL 1.4 % OP SOLN
1.0000 [drp] | Freq: Four times a day (QID) | OPHTHALMIC | Status: DC | PRN
  Filled 2019-04-26: qty 15

## 2019-04-26 MED ORDER — ACETAMINOPHEN 325 MG PO TABS: 650.0000 mg | ORAL_TABLET | Freq: Four times a day (QID) | ORAL | Status: DC | PRN

## 2019-04-26 MED ORDER — DEXTROSE 5 % IV SOLN
INTRAVENOUS | Status: DC
  Administered 2019-04-26: 15:00:00 via INTRAVENOUS

## 2019-04-26 MED ORDER — MORPHINE 100MG IN NS 100ML (1MG/ML) PREMIX INFUSION
0.0000 mg/h | INTRAVENOUS | Status: DC
  Administered 2019-04-26: 5 mg/h via INTRAVENOUS
  Filled 2019-04-26: qty 100

## 2019-04-26 MED ORDER — DIPHENHYDRAMINE HCL 50 MG/ML IJ SOLN: 25.0000 mg | INTRAMUSCULAR | Status: DC | PRN

## 2019-04-26 MED ORDER — MORPHINE BOLUS VIA INFUSION
5.0000 mg | INTRAVENOUS | Status: DC | PRN
  Filled 2019-04-26: qty 5

## 2019-04-26 MED ORDER — GLYCOPYRROLATE 0.2 MG/ML IJ SOLN: 0.2000 mg | INTRAMUSCULAR | Status: DC | PRN

## 2019-04-26 MED ORDER — MORPHINE SULFATE (PF) 2 MG/ML IV SOLN: 2.0000 mg | INTRAVENOUS | Status: DC | PRN

## 2019-04-29 ENCOUNTER — Ambulatory Visit: Payer: Medicare Other | Admitting: Orthotics

## 2019-04-29 ENCOUNTER — Ambulatory Visit: Payer: Medicare Other | Admitting: Podiatry

## 2019-05-01 ENCOUNTER — Telehealth: Payer: Self-pay

## 2019-05-01 NOTE — Telephone Encounter (Signed)
Received dc from [facility].  DC is for cremation and a patient of Doctor Olalere.  DC will be taken to [place] for signature.

## 2019-05-04 NOTE — Progress Notes (Signed)
NAME:  Alison Graves, MRN:  [id number], DOB:  [birth year], LOS: 5 ADMISSION DATE:  04/25/2019, CONSULTATION DATE:  04/04/2019 REFERRING MD:  Juleen China MD, CHIEF COMPLAINT:  SOB and Abdominal Pain   Brief History   85 yr old F w/ PMHx sig for DM and HTN presented on 10/19 to urgent care with her daughter for SOB found to be hypoxic and hypertensive requiring intubation for respiratory failure.  Additionally found to have elevated troponin and cardiology consulted.   During visit with patient daughter noticed that patient was not moving right side, at this time nursign repeated assessment and right sided paraplegia was seen. This prompted a code stroke to be initiated. LKN was unknown but per nursing patient had no neuro deficits on exam at 4pm. Head CT revealed late acute to early subacute left MCA stroke. Neurology was consulted and family was notified  Past Medical History   Active Ambulatory Problems    Diagnosis Date Noted  . Dyslipidemia 11/29/2006  . HTN (hypertension) 11/20/2006  . HEMORRHOIDS 07/25/2006  . MENOPAUSE-RELATED VASOMOTOR SYMPTOMS 04/19/2009  . Osteoarthritis of both knees 12/16/2008  . Polymyalgia rheumatica (HCC) 01/10/2008  . Encounter for intubation 10/04/2010  . Osteopenia 10/04/2010  . Anxiety 11/16/2011  . Edema 02/17/2015  . PCP NOTES >>>>>>>>>>>>>>>>>>>>>>>>> 09/26/2015  . Chondrocalcinosis 04/24/2016  . Osteoarthritis of patellofemoral joints of both knees 04/24/2016  . Osteoarthritis of both hands 04/24/2016  . Osteoarthritis of feet, bilateral 04/24/2016  . Diabetes mellitus, type II (HCC) 01/25/2017  . Increased anion gap metabolic acidosis   . Respiratory acidosis   . Multilobar lung infiltrate    Resolved Ambulatory Problems    Diagnosis Date Noted  . WOUND, HAND 04/21/2010  . CHEST PAIN, ATYPICAL 08/26/2010  . HTN (hypertension)   . Bronchitis 07/12/2012   Past Medical History:  Diagnosis Date  . Diabetes mellitus without complication  (H[facility])   . Hemorrhoids   . Hyperlipemia   . Microhematuria   . Osteoarthritis   . PMR (polymyalgia rheumatica) (HCC)    Significant Hospital Events   10/19 Admitted/ intubated 10/20 off vasopressors   Consults:  10/19 Cardiology  10/23 Neurology   Procedures:  04/05/2019>> ETT  Significant Diagnostic Tests:  10/19 CTA PE / CT A/P >> Negative for acute PE, extensive bilateral multifocal consolidation concerning fior multifocal pneumonia. Cardiac enlargment with bilateral pleural effusions consisted with CHF  10/20 TTE >> EF of 40-45% with evidence of impaired relaxation   Micro Data:  10/19 SARS CoV2 >> neg 10/19 MRSA PCR >> neg 10/19 RVP >> 10/20 trach asp >> 10/19 BCx 2>>  10/19 urine strep >> neg 10/19 urine legionella >>eg  Antimicrobials:  10/19 vanc >> 10/20 10/19 flagyl x1 10/19 cefepime >>   Subjective:  Newly developed left paraplegia seen overnight resulting in code stoke with positive finding of right CVA.  Following discussions with the family, patient will not want to continue current interventions Comfort measures will be initiated  Objective:   Blood pressure (!) 141/66, pulse 78, temperature 99.2 F (37.3 C), temperature source Axillary, resp. rate (!) 22, height 4\' 10"  (1.473 m), weight 65.5 kg, SpO2 98 %.    Vent Mode: PRVC FiO2 (%):  [40 %] 40 % Set Rate:  [22 bmp] 22 bmp Vt Set:  [330 mL] 330 mL PEEP:  [5 cmH20] 5 cmH20 Plateau Pressure:  [15 cmH20-17 cmH20] 16 cmH20   Intake/Output Summary (Last 24 hours) at 05-06-19 1455 Last data filed at 05-06-19 1400 Gross  per 24 hour  Intake 2902.62 ml  Output 640 ml  Net 2262.62 ml   Filed Weights   04/23/19 0500 04/24/19 0500 04/25/19 0500  Weight: 63.6 kg 64.4 kg 65.5 kg   Examination: General: Chronically ill-appearing HEENT: Moist oral mucosa Neuro: Opens eyes to stimulation, not following commands  CV: S1-S2 appreciated PULM: Rhonchi bilaterally GI: Bowel sounds appreciated  Extremities: Warm and dry Skin: no rashes or lesions  Resolved Hospital Problem list   Septic shocks off pressors since 10/19  Assessment & Plan:   Left MCA stroke  Acute respiratory failure with hypoxia Pneumonia -Was undergoing weaning trials -The goal is to transition to comfort measures -Withdrawal of life-sustaining measures  Foreign body -Dental bridge removed with use of glide scope and forceps on 1021  Hypertensive urgency -On antihypertensives  History of congestive heart failure History of coronary artery disease History of diabetes  Goals of care Withdrawal of life-sustaining measures Comfort measures only Comfort measures will be initiated  Discussed at bedside with 5 family members, all their questions were answered  The patient is critically ill with multiple organ systems failure and requires high complexity decision making for assessment and support, frequent evaluation and titration of therapies, application of advanced monitoring technologies and extensive interpretation of multiple databases. Critical Care Time devoted to patient care services described in this note independent of APP/resident time (if applicable)  is 35 minutes.   Sherrilyn Rist MD [facility] Personal pager: [phone] If unanswered, please page CCM On-call: [phone]

## 2019-05-04 NOTE — Progress Notes (Signed)
CDS referral initiated. Referral number is [id number].

## 2019-05-04 NOTE — Progress Notes (Signed)
Wasted 200 mL of fentanyl bag and 50 mL of morphine bag with Misty white, RN.

## 2019-05-04 NOTE — Discharge Summary (Signed)
DEATH SUMMARY   Patient Details  Name: Alison Graves MRN: [id number] DOB: [birth year][place] POLIODischarge Information   Admit Date:  05/06/2019  Date of Death: Date of Death: 05/11/2019  Time of Death: Time of Death: 11-16-1722  Length of Stay: 5  Referring Physician: Wanda Plump, MD   Reason(s) for Hospitalization  Patient presented to the hospital with abdominal pain, epigastric pain Developed respiratory distress and had to be intubated for hypoxic respiratory failure  Diagnoses  Preliminary cause of death:   Acute respiratory failure Secondary Diagnoses (including complications and co-morbidities):  Active Problems:   Encounter for intubation   Acute respiratory failure with hypoxia (HCC)   Elevated troponin   ACS (acute coronary syndrome) (HCC)   Acute encephalopathy   Shock circulatory (HCC)   Acute pulmonary edema (HCC)   Pneumonia of both lungs due to infectious organism   Foreign body in alimentary tract   Foreign body in esophagus   Pressure injury of skin   ARDS (adult respiratory distress syndrome) (HCC)   Cerebral embolism with cerebral infarction   Acute systolic (congestive) heart failure [facility])   Brief Hospital Course (including significant findings, care, treatment, and services provided and events leading to death)  Alison Graves is a 85 y.o. year old female who presented with a few days history of abdominal pain Worsening symptoms led to presentation to the hospital Noted to be hypoxemic in the emergency department requiring increasing oxygen supplementation Patient was intubated and admitted to the intensive care unit Patient developed right-sided weakness on the 22nd with evaluation confirming a cerebrovascular accident. Family was updated, multiple discussions had with family members and the consensus was that the best course of treatment will be comfort measures Withdrawal of life-sustaining treatment was put in place on 2019/05/11. Patient expired  at 1724 hrs. on 05-11-2019   Pertinent Labs and Studies  Significant Diagnostic Studies Ct Angio Head W Or Wo Contrast  Result Date: 04/25/2019 CLINICAL DATA:  Initial evaluation for acute stroke, right hemiplegia, confirmed MCA territory infarcts. EXAM: CT ANGIOGRAPHY HEAD AND NECK CT PERFUSION BRAIN TECHNIQUE: Multidetector CT imaging of the head and neck was performed using the standard protocol during bolus administration of intravenous contrast. Multiplanar CT image reconstructions and MIPs were obtained to evaluate the vascular anatomy. Carotid stenosis measurements (when applicable) are obtained utilizing NASCET criteria, using the distal internal carotid diameter as the denominator. Multiphase CT imaging of the brain was performed following IV bolus contrast injection. Subsequent parametric perfusion maps were calculated using RAPID software. CONTRAST:  OMNIPAQUE IOHEXOL 350 MG/ML SOLN COMPARISON:  Prior head CT from earlier the same day. FINDINGS: CTA NECK FINDINGS Aortic arch: Visualized aortic arch normal in caliber with normal branch pattern. Moderate atherosclerotic change about the arch and origin of the great vessels without hemodynamically significant stenosis. Visualized subclavian arteries patent. Right carotid system: Right CCA tortuous proximally but is widely patent to the bifurcation. No significant atheromatous narrowing about the right bifurcation. Focal tortuosity of the distal right ICA with superimposed small 3 mm focal outpouching suspicious for pseudoaneurysm (series 5, image 168). No significant intimal irregularity or intraluminal thrombus. Finding is age indeterminate, but suspected to be chronic in nature. Right ICA otherwise patent to the skull base without abnormality. Left carotid system: Left CCA mildly tortuous but patent to the bifurcation without stenosis. Mild calcified plaque about the left bifurcation without significant stenosis. Left ICA mildly tortuous but  widely patent to the skull base without abnormality. Vertebral arteries: Both  vertebral arteries arise from the subclavian arteries. Mild plaque at the origin of the right vertebral without significant stenosis. Vertebral arteries mildly tortuous but patent without stenosis, dissection, or occlusion. Skeleton: No acute osseous finding. No discrete lytic or blastic osseous lesions. Prominent degenerative changes noted about the C1-2 articulation. Other neck: Endotracheal enteric tubes in place. Tip of the endotracheal to somewhat low lying at the level of the carina. Consider retraction by approximately 2 cm. No other acute soft tissue abnormality within the neck. Upper chest: Right main pulmonary artery dilated up to 2.4 cm, suggesting a degree of underlying pulmonary hypertension. Moderate to large bilateral pleural effusions with associated atelectasis, partially visualized. Patchy ground-glass opacity within the right upper lobe, suspicious for pneumonia. Review of the MIP images confirms the above findings CTA HEAD FINDINGS Anterior circulation: Petrous segments patent bilaterally. Scattered atheromatous plaque within the cavernous/supraclinoid ICAs with associated mild multifocal narrowing. A1 segments patent bilaterally. Normal anterior communicating artery complex. Anterior cerebral arteries widely patent to their distal aspects. No M1 stenosis or occlusion. Negative MCA bifurcations. Distal MCA branches well perfused and symmetric. Scattered distal small vessel atheromatous irregularity noted. Posterior circulation: Both vertebral arteries patent as they course into the cranial vault. Short-segment mild stenosis involving the right V4 segment beyond the takeoff of the right PICA. There is focal irregularity with mild narrowing of the distal left V4 segment just beyond the takeoff of the left PICA, favored to reflect atherosclerotic change, although a short-segment dissection not entirely excluded (a series  5, image 133). No raised dissection flap or significant intimal irregularity. Both picas patent. Basilar widely patent to its distal aspect without stenosis. Superior cerebral arteries patent bilaterally. Both PCAs primarily supplied via the basilar. Focal tortuosity of the right P1 segment noted. PCAs well perfused to their distal aspects without significant stenosis. Venous sinuses: Grossly patent allowing for timing the contrast bolus. Anatomic variants: None significant. Review of the MIP images confirms the above findings CT Brain Perfusion Findings: ASPECTS: 7 CBF (<30%) Volume: 20mL-acute to subacute left larger than right MCA territory infarcts not seen by perfusion. Perfusion (Tmax>6.0s) volume: 67mL Mismatch Volume: 31mL Infarction Location:Acute bilateral MCA territory infarcts, left greater than right. No appreciable surrounding penumbra. IMPRESSION: CTA HEAD AND NECK IMPRESSION: 1. Negative CTA for large vessel occlusion. 2. 3 mm focal outpouching arising from the distal cervical right ICA, suspicious for a small pseudoaneurysm. While this finding is age indeterminate, this is suspected to reflect a chronic finding. 3. Focal irregularity involving the distal left V4 segment, favored to reflect atherosclerotic change, although a short-segment dissection is difficult to exclude. No frank raised dissection flap. 4. Scattered atherosclerotic change elsewhere about the major arterial vasculature of the head and neck as above. No hemodynamically significant or correctable stenosis. 5. Moderate to large bilateral pleural effusions with associated atelectasis. Superimposed patchy opacity within the right upper lobe suspicious for pneumonia. Enlargement of the right main pulmonary artery suggests a degree of underlying pulmonary hypertension. CT PERFUSION IMPRESSION: Acute to subacute left larger than right MCA territory infarcts. No surrounding ischemic penumbra evident by CT perfusion. These results were  communicated to Dr. Rory Percy at 2:49 amon 10/23/2020by text page via the [facility] messaging system. Electronically Signed   By: Jeannine Boga M.D.   On: 04/25/2019 03:20   Ct Angio Neck W Or Wo Contrast  Result Date: 04/25/2019 CLINICAL DATA:  Initial evaluation for acute stroke, right hemiplegia, confirmed MCA territory infarcts. EXAM: CT ANGIOGRAPHY HEAD AND NECK CT PERFUSION BRAIN TECHNIQUE:  Multidetector CT imaging of the head and neck was performed using the standard protocol during bolus administration of intravenous contrast. Multiplanar CT image reconstructions and MIPs were obtained to evaluate the vascular anatomy. Carotid stenosis measurements (when applicable) are obtained utilizing NASCET criteria, using the distal internal carotid diameter as the denominator. Multiphase CT imaging of the brain was performed following IV bolus contrast injection. Subsequent parametric perfusion maps were calculated using RAPID software. CONTRAST:  OMNIPAQUE IOHEXOL 350 MG/ML SOLN COMPARISON:  Prior head CT from earlier the same day. FINDINGS: CTA NECK FINDINGS Aortic arch: Visualized aortic arch normal in caliber with normal branch pattern. Moderate atherosclerotic change about the arch and origin of the great vessels without hemodynamically significant stenosis. Visualized subclavian arteries patent. Right carotid system: Right CCA tortuous proximally but is widely patent to the bifurcation. No significant atheromatous narrowing about the right bifurcation. Focal tortuosity of the distal right ICA with superimposed small 3 mm focal outpouching suspicious for pseudoaneurysm (series 5, image 168). No significant intimal irregularity or intraluminal thrombus. Finding is age indeterminate, but suspected to be chronic in nature. Right ICA otherwise patent to the skull base without abnormality. Left carotid system: Left CCA mildly tortuous but patent to the bifurcation without stenosis. Mild calcified plaque  about the left bifurcation without significant stenosis. Left ICA mildly tortuous but widely patent to the skull base without abnormality. Vertebral arteries: Both vertebral arteries arise from the subclavian arteries. Mild plaque at the origin of the right vertebral without significant stenosis. Vertebral arteries mildly tortuous but patent without stenosis, dissection, or occlusion. Skeleton: No acute osseous finding. No discrete lytic or blastic osseous lesions. Prominent degenerative changes noted about the C1-2 articulation. Other neck: Endotracheal enteric tubes in place. Tip of the endotracheal to somewhat low lying at the level of the carina. Consider retraction by approximately 2 cm. No other acute soft tissue abnormality within the neck. Upper chest: Right main pulmonary artery dilated up to 2.4 cm, suggesting a degree of underlying pulmonary hypertension. Moderate to large bilateral pleural effusions with associated atelectasis, partially visualized. Patchy ground-glass opacity within the right upper lobe, suspicious for pneumonia. Review of the MIP images confirms the above findings CTA HEAD FINDINGS Anterior circulation: Petrous segments patent bilaterally. Scattered atheromatous plaque within the cavernous/supraclinoid ICAs with associated mild multifocal narrowing. A1 segments patent bilaterally. Normal anterior communicating artery complex. Anterior cerebral arteries widely patent to their distal aspects. No M1 stenosis or occlusion. Negative MCA bifurcations. Distal MCA branches well perfused and symmetric. Scattered distal small vessel atheromatous irregularity noted. Posterior circulation: Both vertebral arteries patent as they course into the cranial vault. Short-segment mild stenosis involving the right V4 segment beyond the takeoff of the right PICA. There is focal irregularity with mild narrowing of the distal left V4 segment just beyond the takeoff of the left PICA, favored to reflect  atherosclerotic change, although a short-segment dissection not entirely excluded (a series 5, image 133). No raised dissection flap or significant intimal irregularity. Both picas patent. Basilar widely patent to its distal aspect without stenosis. Superior cerebral arteries patent bilaterally. Both PCAs primarily supplied via the basilar. Focal tortuosity of the right P1 segment noted. PCAs well perfused to their distal aspects without significant stenosis. Venous sinuses: Grossly patent allowing for timing the contrast bolus. Anatomic variants: None significant. Review of the MIP images confirms the above findings CT Brain Perfusion Findings: ASPECTS: 7 CBF (<30%) Volume: 60mL-acute to subacute left larger than right MCA territory infarcts not seen by perfusion. Perfusion (  Tmax>6.0s) volume: 0mL Mismatch Volume: 0mL Infarction Location:Acute bilateral MCA territory infarcts, left greater than right. No appreciable surrounding penumbra. IMPRESSION: CTA HEAD AND NECK IMPRESSION: 1. Negative CTA for large vessel occlusion. 2. 3 mm focal outpouching arising from the distal cervical right ICA, suspicious for a small pseudoaneurysm. While this finding is age indeterminate, this is suspected to reflect a chronic finding. 3. Focal irregularity involving the distal left V4 segment, favored to reflect atherosclerotic change, although a short-segment dissection is difficult to exclude. No frank raised dissection flap. 4. Scattered atherosclerotic change elsewhere about the major arterial vasculature of the head and neck as above. No hemodynamically significant or correctable stenosis. 5. Moderate to large bilateral pleural effusions with associated atelectasis. Superimposed patchy opacity within the right upper lobe suspicious for pneumonia. Enlargement of the right main pulmonary artery suggests a degree of underlying pulmonary hypertension. CT PERFUSION IMPRESSION: Acute to subacute left larger than right MCA territory  infarcts. No surrounding ischemic penumbra evident by CT perfusion. These results were communicated to Dr. Wilford Corner at 2:49 amon 10/23/2020by text page via the [facility] messaging system. Electronically Signed   By: Rise Mu M.D.   On: 04/25/2019 03:20   Ct Angio Chest Pe W And/or Wo Contrast  Result Date: 04/07/2019 CLINICAL DATA:  Abdominal pain. Oxygen desaturation. EXAM: CT ANGIOGRAPHY CHEST CT ABDOMEN AND PELVIS WITH CONTRAST TECHNIQUE: Multidetector CT imaging of the chest was performed using the standard protocol during bolus administration of intravenous contrast. Multiplanar CT image reconstructions and MIPs were obtained to evaluate the vascular anatomy. Multidetector CT imaging of the abdomen and pelvis was performed using the standard protocol during bolus administration of intravenous contrast. CONTRAST:  OMNIPAQUE IOHEXOL 350 MG/ML SOLN COMPARISON:  None. FINDINGS: CTA CHEST FINDINGS Cardiovascular: The main pulmonary artery is patent. No central obstructing pulmonary artery filling defects identified. Artifact from mixing of contrast with unopacified blood is noted within the central pulmonary arteries bilaterally. Reflux of contrast material from the right atrium into the IVC and hepatic veins compatible with passive venous congestion which may reflect right heart failure. Mild cardiac enlargement. No pericardial effusion. Three vessel coronary artery atherosclerotic calcifications. Mediastinum/Nodes: No enlarged mediastinal, hilar, or axillary lymph nodes. Thyroid gland, trachea, and esophagus demonstrate no significant findings. Lungs/Pleura: There are small bilateral pleural effusions. Diffuse bilateral upper and lower lobe interlobular septal thickening with areas of ground-glass attenuation are identified compatible with pulmonary edema. There is dense airspace consolidation involving the right lower lobe, right middle lobe and left lower lobe. Partial airspace consolidation of  the right upper lobe and central left upper lobe also noted. Musculoskeletal: Multi level thoracic degenerative disc disease identified. No aggressive lytic or sclerotic bone lesions identified. Review of the MIP images confirms the above findings. CT ABDOMEN and PELVIS FINDINGS Hepatobiliary: No focal liver abnormality is seen. No gallstones, gallbladder wall thickening, or biliary dilatation. Calcification within the right hepatic lobe noted, likely benign Pancreas: Unremarkable. No pancreatic ductal dilatation or surrounding inflammatory changes. Spleen: Normal in size without focal abnormality. Adrenals/Urinary Tract: Normal appearance of the adrenal glands. The kidneys are unremarkable. No mass or hydronephrosis. Stomach/Bowel: Stomach is within normal limits. Appendix appears normal. No evidence of bowel wall thickening, distention, or inflammatory changes. Vascular/Lymphatic: Aortic atherosclerosis. No aneurysm identified. No abdominopelvic adenopathy Reproductive: Status post hysterectomy. No adnexal masses. Other: No free fluid or fluid collections. Musculoskeletal: No acute or significant osseous findings. Multi level spondylosis within the lumbar spine identified. Anterolisthesis of L3 on L4 and L4 on L5  is noted and there is marked multilevel disc space narrowing and endplate spurring. Review of the MIP images confirms the above findings. IMPRESSION: 1. No evidence for acute pulmonary embolus. 2. Extensive, bilateral multifocal airspace consolidation identified concerning for multifocal pneumonia. Right lower lobe appears most severely affected. Underlying malignancy would be difficult to exclude and follow-up imaging to resolution is advise. If this does not improve with antibiotic therapy then further investigation with bronchoscopy may be considered to assess for underlying tumor. 3. Cardiac enlargement, bilateral pleural effusions and diffuse pulmonary edema compatible with CHF. Reflux of contrast  from the right heart into the IVC and hepatic veins suggest right heart failure. 4. No acute findings within the abdomen or pelvis. 5. Three vessel coronary artery calcifications noted. 6. Lumbar spondylosis. Aortic Atherosclerosis (ICD10-I70.0). Electronically Signed   By: Signa Kell M.D.   On: 2019/04/22 20:55   Ct Abdomen Pelvis W Contrast  Result Date: 04/22/2019 CLINICAL DATA:  Abdominal pain. Oxygen desaturation. EXAM: CT ANGIOGRAPHY CHEST CT ABDOMEN AND PELVIS WITH CONTRAST TECHNIQUE: Multidetector CT imaging of the chest was performed using the standard protocol during bolus administration of intravenous contrast. Multiplanar CT image reconstructions and MIPs were obtained to evaluate the vascular anatomy. Multidetector CT imaging of the abdomen and pelvis was performed using the standard protocol during bolus administration of intravenous contrast. CONTRAST:  OMNIPAQUE IOHEXOL 350 MG/ML SOLN COMPARISON:  None. FINDINGS: CTA CHEST FINDINGS Cardiovascular: The main pulmonary artery is patent. No central obstructing pulmonary artery filling defects identified. Artifact from mixing of contrast with unopacified blood is noted within the central pulmonary arteries bilaterally. Reflux of contrast material from the right atrium into the IVC and hepatic veins compatible with passive venous congestion which may reflect right heart failure. Mild cardiac enlargement. No pericardial effusion. Three vessel coronary artery atherosclerotic calcifications. Mediastinum/Nodes: No enlarged mediastinal, hilar, or axillary lymph nodes. Thyroid gland, trachea, and esophagus demonstrate no significant findings. Lungs/Pleura: There are small bilateral pleural effusions. Diffuse bilateral upper and lower lobe interlobular septal thickening with areas of ground-glass attenuation are identified compatible with pulmonary edema. There is dense airspace consolidation involving the right lower lobe, right middle lobe and  left lower lobe. Partial airspace consolidation of the right upper lobe and central left upper lobe also noted. Musculoskeletal: Multi level thoracic degenerative disc disease identified. No aggressive lytic or sclerotic bone lesions identified. Review of the MIP images confirms the above findings. CT ABDOMEN and PELVIS FINDINGS Hepatobiliary: No focal liver abnormality is seen. No gallstones, gallbladder wall thickening, or biliary dilatation. Calcification within the right hepatic lobe noted, likely benign Pancreas: Unremarkable. No pancreatic ductal dilatation or surrounding inflammatory changes. Spleen: Normal in size without focal abnormality. Adrenals/Urinary Tract: Normal appearance of the adrenal glands. The kidneys are unremarkable. No mass or hydronephrosis. Stomach/Bowel: Stomach is within normal limits. Appendix appears normal. No evidence of bowel wall thickening, distention, or inflammatory changes. Vascular/Lymphatic: Aortic atherosclerosis. No aneurysm identified. No abdominopelvic adenopathy Reproductive: Status post hysterectomy. No adnexal masses. Other: No free fluid or fluid collections. Musculoskeletal: No acute or significant osseous findings. Multi level spondylosis within the lumbar spine identified. Anterolisthesis of L3 on L4 and L4 on L5 is noted and there is marked multilevel disc space narrowing and endplate spurring. Review of the MIP images confirms the above findings. IMPRESSION: 1. No evidence for acute pulmonary embolus. 2. Extensive, bilateral multifocal airspace consolidation identified concerning for multifocal pneumonia. Right lower lobe appears most severely affected. Underlying malignancy would be difficult to exclude and  follow-up imaging to resolution is advise. If this does not improve with antibiotic therapy then further investigation with bronchoscopy may be considered to assess for underlying tumor. 3. Cardiac enlargement, bilateral pleural effusions and diffuse  pulmonary edema compatible with CHF. Reflux of contrast from the right heart into the IVC and hepatic veins suggest right heart failure. 4. No acute findings within the abdomen or pelvis. 5. Three vessel coronary artery calcifications noted. 6. Lumbar spondylosis. Aortic Atherosclerosis (ICD10-I70.0). Electronically Signed   By: Signa Kell M.D.   On: 05/10/19 20:55   Ct Cerebral Perfusion W Contrast  Result Date: 04/25/2019 CLINICAL DATA:  Initial evaluation for acute stroke, right hemiplegia, confirmed MCA territory infarcts. EXAM: CT ANGIOGRAPHY HEAD AND NECK CT PERFUSION BRAIN TECHNIQUE: Multidetector CT imaging of the head and neck was performed using the standard protocol during bolus administration of intravenous contrast. Multiplanar CT image reconstructions and MIPs were obtained to evaluate the vascular anatomy. Carotid stenosis measurements (when applicable) are obtained utilizing NASCET criteria, using the distal internal carotid diameter as the denominator. Multiphase CT imaging of the brain was performed following IV bolus contrast injection. Subsequent parametric perfusion maps were calculated using RAPID software. CONTRAST:  OMNIPAQUE IOHEXOL 350 MG/ML SOLN COMPARISON:  Prior head CT from earlier the same day. FINDINGS: CTA NECK FINDINGS Aortic arch: Visualized aortic arch normal in caliber with normal branch pattern. Moderate atherosclerotic change about the arch and origin of the great vessels without hemodynamically significant stenosis. Visualized subclavian arteries patent. Right carotid system: Right CCA tortuous proximally but is widely patent to the bifurcation. No significant atheromatous narrowing about the right bifurcation. Focal tortuosity of the distal right ICA with superimposed small 3 mm focal outpouching suspicious for pseudoaneurysm (series 5, image 168). No significant intimal irregularity or intraluminal thrombus. Finding is age indeterminate, but suspected to be  chronic in nature. Right ICA otherwise patent to the skull base without abnormality. Left carotid system: Left CCA mildly tortuous but patent to the bifurcation without stenosis. Mild calcified plaque about the left bifurcation without significant stenosis. Left ICA mildly tortuous but widely patent to the skull base without abnormality. Vertebral arteries: Both vertebral arteries arise from the subclavian arteries. Mild plaque at the origin of the right vertebral without significant stenosis. Vertebral arteries mildly tortuous but patent without stenosis, dissection, or occlusion. Skeleton: No acute osseous finding. No discrete lytic or blastic osseous lesions. Prominent degenerative changes noted about the C1-2 articulation. Other neck: Endotracheal enteric tubes in place. Tip of the endotracheal to somewhat low lying at the level of the carina. Consider retraction by approximately 2 cm. No other acute soft tissue abnormality within the neck. Upper chest: Right main pulmonary artery dilated up to 2.4 cm, suggesting a degree of underlying pulmonary hypertension. Moderate to large bilateral pleural effusions with associated atelectasis, partially visualized. Patchy ground-glass opacity within the right upper lobe, suspicious for pneumonia. Review of the MIP images confirms the above findings CTA HEAD FINDINGS Anterior circulation: Petrous segments patent bilaterally. Scattered atheromatous plaque within the cavernous/supraclinoid ICAs with associated mild multifocal narrowing. A1 segments patent bilaterally. Normal anterior communicating artery complex. Anterior cerebral arteries widely patent to their distal aspects. No M1 stenosis or occlusion. Negative MCA bifurcations. Distal MCA branches well perfused and symmetric. Scattered distal small vessel atheromatous irregularity noted. Posterior circulation: Both vertebral arteries patent as they course into the cranial vault. Short-segment mild stenosis involving the  right V4 segment beyond the takeoff of the right PICA. There is focal irregularity with mild  narrowing of the distal left V4 segment just beyond the takeoff of the left PICA, favored to reflect atherosclerotic change, although a short-segment dissection not entirely excluded (a series 5, image 133). No raised dissection flap or significant intimal irregularity. Both picas patent. Basilar widely patent to its distal aspect without stenosis. Superior cerebral arteries patent bilaterally. Both PCAs primarily supplied via the basilar. Focal tortuosity of the right P1 segment noted. PCAs well perfused to their distal aspects without significant stenosis. Venous sinuses: Grossly patent allowing for timing the contrast bolus. Anatomic variants: None significant. Review of the MIP images confirms the above findings CT Brain Perfusion Findings: ASPECTS: 7 CBF (<30%) Volume: 360mL-acute to subacute left larger than right MCA territory infarcts not seen by perfusion. Perfusion (Tmax>6.0s) volume: 0mL Mismatch Volume: 0mL Infarction Location:Acute bilateral MCA territory infarcts, left greater than right. No appreciable surrounding penumbra. IMPRESSION: CTA HEAD AND NECK IMPRESSION: 1. Negative CTA for large vessel occlusion. 2. 3 mm focal outpouching arising from the distal cervical right ICA, suspicious for a small pseudoaneurysm. While this finding is age indeterminate, this is suspected to reflect a chronic finding. 3. Focal irregularity involving the distal left V4 segment, favored to reflect atherosclerotic change, although a short-segment dissection is difficult to exclude. No frank raised dissection flap. 4. Scattered atherosclerotic change elsewhere about the major arterial vasculature of the head and neck as above. No hemodynamically significant or correctable stenosis. 5. Moderate to large bilateral pleural effusions with associated atelectasis. Superimposed patchy opacity within the right upper lobe suspicious for  pneumonia. Enlargement of the right main pulmonary artery suggests a degree of underlying pulmonary hypertension. CT PERFUSION IMPRESSION: Acute to subacute left larger than right MCA territory infarcts. No surrounding ischemic penumbra evident by CT perfusion. These results were communicated to Dr. Wilford CornerArora at 2:49 amon 10/23/2020by text page [facility]. Electronically Signed   By: Rise MuBenjamin  McClintock M.D.   On: 04/25/2019 03:20   Dg Chest Port 1 View  Result Date: 04/24/2019 CLINICAL DATA:  Adult respiratory distress syndrome. EXAM: PORTABLE CHEST 1 VIEW COMPARISON:  Chest x-ray 04/23/2019 FINDINGS: The endotracheal tube is 18.5 mm above the carina. The NG tube is coursing down the esophagus and into the stomach. Stable mild cardiac enlargement. Stable tortuosity and calcification of the thoracic aorta. Prominent pulmonary hila appear relatively stable. Continued improved lung aeration with some persistent patchy basilar infiltrates. Persistent small effusions also. IMPRESSION: 1. Support apparatus in good position, unchanged. 2. Continued improved lung aeration. There are some persistent patchy basilar infiltrates and small effusions. Electronically Signed   By: Rudie MeyerP.  Gallerani M.D.   On: 04/24/2019 10:52   Dg Chest Port 1 View  Result Date: 04/23/2019 CLINICAL DATA:  History of endotracheal tube EXAM: PORTABLE CHEST 1 VIEW COMPARISON:  Two days ago FINDINGS: Endotracheal tube tip is halfway between the clavicular heads and carina-the latter location is implied by mainstem bronchial anatomy (there is overlapping ventilation hardware). The orogastric tube tip reaches the stomach with looping into the fundus. Bilateral airspace disease asymmetric to the right. There is mild improvement. Stable cardiomegaly. No visible effusion or pneumothorax. What looks like a dental bridge overlaps the throat, 2 cm in maximal span. These results were called by telephone at the time of interpretation on  04/23/2019 at 8:55 am [facility] , who verbally acknowledged these results. IMPRESSION: 1. 2 cm foreign body over the neck, suspect this is a dental bridge at the level of the throat. 2. Stable hardware positioning. 3.  Improved pneumonia. Electronically Signed   By: Marnee Spring M.D.   On: 04/23/2019 08:56   Dg Chest Port 1 View  Result Date: 2019/05/11 CLINICAL DATA:  Intubation EXAM: PORTABLE CHEST 1 VIEW COMPARISON:  11-May-2019, 3:48 p.m. FINDINGS: Interval placement of endotracheal tube, tip position over the lower trachea, approximately 2.5 cm above the carina. Esophagogastric tube with tip and side port below the diaphragm. Interval increase in extensive bilateral heterogeneous and interstitial airspace opacity with probable small pleural effusions, most dense and conspicuous in the right midlung. Cardiomegaly. IMPRESSION: 1. Interval placement of endotracheal tube, tip position over the lower trachea, approximately 2.5 cm above the carina. 2.  Esophagogastric tube with tip and side port below the diaphragm. 3. Interval increase in extensive bilateral heterogeneous and interstitial airspace opacity with probable small pleural effusions, most dense and conspicuous in the right midlung. Findings are consistent with worsened edema or infection. 4.  Cardiomegaly. Electronically Signed   By: Lauralyn Primes M.D.   On: May 11, 2019 21:20   Dg Chest Portable 1 View  Result Date: May 11, 2019 CLINICAL DATA:  Abdominal pain and shortness of breath EXAM: PORTABLE CHEST 1 VIEW COMPARISON:  02/01/2016 FINDINGS: Mild cardiac enlargement. Aortic atherosclerosis. New airspace disease is identified within the right midlung and right base. Mild diffuse pulmonary edema is also noted. The visualized osseous structures are unremarkable. IMPRESSION: 1. New airspace disease within the right midlung and right base worrisome for pneumonia. 2. Mild pulmonary edema. Electronically Signed   By: Signa Kell M.D.   On:  May 11, 2019 16:00   Ct Head Code Stroke Wo Contrast  Result Date: 04/25/2019 CLINICAL DATA:  Code stroke. Initial evaluation for acute right hemiplegia. EXAM: CT HEAD WITHOUT CONTRAST TECHNIQUE: Contiguous axial images were obtained from the base of the skull through the vertex without intravenous contrast. COMPARISON:  None. FINDINGS: Brain: Generalized age-related cerebral atrophy. Moderate-sized area of evolving cytotoxic edema involving the left insula and overlying left frontoparietal region, compatible with acute to subacute left MCA territory infarct. No associated hemorrhage or mass effect. Additional small evolving area of cytotoxic edema involving the right frontal cortex consistent with a small acute right MCA territory infarct. No associated hemorrhage or mass effect. No other evidence for acute large vessel ischemia. No mass lesion, midline shift or mass effect. No hydrocephalus. No extra-axial fluid collection. Vascular: No hyperdense vessel. Calcified atherosclerosis at the skull base. Skull: Scalp soft tissues and calvarium within normal limits. Sinuses/Orbits: Globes and orbital soft tissues demonstrate no acute finding. Chronic left maxillary sinusitis. Scattered mucosal thickening throughout the remaining paranasal sinuses. Patient is intubated. Trace bilateral mastoid effusions noted. Other: None. ASPECTS [facility] Stroke Program Early CT Score) - Ganglionic level infarction (caudate, lentiform nuclei, internal capsule, insula, M1-M3 cortex): 6 - Supraganglionic infarction (M4-M6 cortex): 1 Total score (0-10 with 10 being normal): 7 IMPRESSION: 1. Evolving moderate-sized acute to early subacute left MCA territory infarct as above. No intracranial hemorrhage. 2. Additional smaller evolving acute to early subacute right frontal cortical infarct. 3. ASPECTS is 7. These results were communicated to Dr. Wilford Corner at 1:46 amon 10/23/2020by text page via the [facility] messaging system. Electronically Signed    By: Rise Mu M.D.   On: 04/25/2019 01:53    Microbiology Recent Results (from the past 240 hour(s))  SARS Coronavirus 2 by RT PCR (hospital order, performed in [facility] hospital lab) Nasopharyngeal Nasopharyngeal Swab     Status: None   Collection Time: 2019-05-11  3:58 PM   Specimen: Nasopharyngeal Swab  Result Value Ref Range Status   SARS Coronavirus 2 NEGATIVE NEGATIVE Final    Comment: (NOTE) If result is NEGATIVE SARS-CoV-2 target nucleic acids are NOT DETECTED. The SARS-CoV-2 RNA is generally detectable in upper and lower  respiratory specimens during the acute phase of infection. The lowest  concentration of SARS-CoV-2 viral copies this assay can detect is 250  copies / mL. A negative result does not preclude SARS-CoV-2 infection  and should not be used as the sole basis for treatment or other  patient management decisions.  A negative result may occur with  improper specimen collection / handling, submission of specimen other  than nasopharyngeal swab, presence of viral mutation(s) within the  areas targeted by this assay, and inadequate number of viral copies  (<250 copies / mL). A negative result must be combined with clinical  observations, patient history, and epidemiological information. If result is POSITIVE SARS-CoV-2 target nucleic acids are DETECTED. The SARS-CoV-2 RNA is generally detectable in upper and lower  respiratory specimens dur ing the acute phase of infection.  Positive  results are indicative of active infection with SARS-CoV-2.  Clinical  correlation with patient history and other diagnostic information is  necessary to determine patient infection status.  Positive results do  not rule out bacterial infection or co-infection with other viruses. If result is PRESUMPTIVE POSTIVE SARS-CoV-2 nucleic acids MAY BE PRESENT.   A presumptive positive result was obtained on the submitted specimen  and confirmed on repeat testing.  While 2019  novel coronavirus  (SARS-CoV-2) nucleic acids may be present in the submitted sample  additional confirmatory testing may be necessary for epidemiological  and / or clinical management purposes  to differentiate between  SARS-CoV-2 and other Sarbecovirus currently known to infect humans.  If clinically indicated additional testing with an alternate test  methodology [phone]) is advised. The SARS-CoV-2 RNA is generally  detectable in upper and lower respiratory sp ecimens during the acute  phase of infection. The expected result is Negative. Fact Sheet for Patients:  BoilerBrush.com.cy Fact Sheet for Healthcare Providers: https://pope.com/ This test is not yet approved or cleared by the [place] FDA and has been authorized for detection and/or diagnosis of SARS-CoV-2 by FDA under an Emergency Use Authorization (EUA).  This EUA will remain in effect (meaning this test can be used) for the duration of the COVID-19 declaration under Section 564(b)(1) of the Act, 21 U.S.C. section 360bbb-3(b)(1), unless the authorization is terminated or revoked sooner. Performed at [facility], [address] [place], [place] [address]   Blood culture (routine x 2)     Status: None   Collection Time: 05/03/2019  5:03 PM   Specimen: BLOOD  Result Value Ref Range Status   Specimen Description BLOOD RIGHT ANTECUBITAL  Final   Special Requests   Final    BOTTLES DRAWN AEROBIC AND ANAEROBIC Blood Culture results may not be optimal due to an inadequate volume of blood received in culture bottles   Culture   Final    NO GROWTH 5 DAYS Performed at [facility], [address], [place], [place] [address]    Report Status 2019/05/09 FINAL  Final  Blood culture (routine x 2)     Status: None   Collection Time: 04/17/2019  8:16 PM   Specimen: BLOOD RIGHT HAND  Result Value Ref Range Status   Specimen Description BLOOD RIGHT HAND  Final   Special  Requests   Final    BOTTLES DRAWN AEROBIC  AND ANAEROBIC Blood Culture results may not be optimal due to an inadequate volume of blood received in culture bottles   Culture   Final    NO GROWTH 5 DAYS Performed at [facility], [address] [place], [place] [address]    Report Status 04/17/2019 FINAL  Final  MRSA PCR Screening     Status: None   Collection Time: 04/22/19  3:12 PM   Specimen: Nasopharyngeal  Result Value Ref Range Status   MRSA by PCR NEGATIVE NEGATIVE Final    Comment:        The GeneXpert MRSA Assay (FDA approved for NASAL specimens only), is one component of a comprehensive MRSA colonization surveillance program. It is not intended to diagnose MRSA infection nor to guide or monitor treatment for MRSA infections. Performed at [facility], [address] [place], [place] [address]   Culture, respiratory (non-expectorated)     Status: None   Collection Time: 04/22/19  3:29 PM   Specimen: Tracheal Aspirate; Respiratory  Result Value Ref Range Status   Specimen Description TRACHEAL ASPIRATE  Final   Special Requests NONE  Final   Gram Stain   Final    MODERATE WBC PRESENT, PREDOMINANTLY PMN NO ORGANISMS SEEN Performed at [facility], [address] [place], [place] [address]    Culture RARE STAPHYLOCOCCUS AUREUS  Final   Report Status 04/25/2019 FINAL  Final   Organism ID, Bacteria STAPHYLOCOCCUS AUREUS  Final      Susceptibility   Staphylococcus aureus - MIC*    CIPROFLOXACIN <=0.5 SENSITIVE Sensitive     ERYTHROMYCIN RESISTANT Resistant     GENTAMICIN <=0.5 SENSITIVE Sensitive     OXACILLIN 0.5 SENSITIVE Sensitive     TETRACYCLINE <=1 SENSITIVE Sensitive     VANCOMYCIN <=0.5 SENSITIVE Sensitive     TRIMETH/SULFA <=10 SENSITIVE Sensitive     CLINDAMYCIN RESISTANT Resistant     RIFAMPIN <=0.5 SENSITIVE Sensitive     Inducible Clindamycin POSITIVE Resistant     * RARE STAPHYLOCOCCUS AUREUS    Lab Basic Metabolic Panel: Recent  Labs  Lab 04/22/19 1929 04/23/19 0420 04/23/19 0556 04/24/19 1007 04/25/19 0421  NA  --  142 141 140 139  K  --  3.2* 3.2* 3.8 3.2*  CL  --   --  111 111 108  CO2  --   --  23 21* 23  GLUCOSE  --   --  163* 260* 188*  BUN  --   --  26* 31* 24*  CREATININE  --   --  0.93 0.78 0.65  CALCIUM  --   --  8.1* 8.2* 8.3*  MG 2.8*  --  2.2  --   --   PHOS  --   --  1.0*  --   --    Liver Function Tests: No results for input(s): AST, ALT, ALKPHOS, BILITOT, PROT, ALBUMIN in the last 168 hours. No results for input(s): LIPASE, AMYLASE in the last 168 hours. No results for input(s): AMMONIA in the last 168 hours. CBC: Recent Labs  Lab 04/23/19 0420 04/23/19 0556 04/24/19 0313 04/25/19 0421  WBC  --  12.2* 9.6 10.4  HGB 11.2* 12.6 11.1* 11.4*  HCT 33.0* 39.1 33.7* 35.6*  MCV  --  94.0 93.1 93.4  PLT  --  200 162 170   Cardiac Enzymes: No results for input(s): CKTOTAL, CKMB, CKMBINDEX, TROPONINI in the last 168 hours. Sepsis Labs: Recent Labs  Lab 04/23/19  7939 04/24/19 0313 04/25/19 0421  WBC 12.2* 9.6 10.4    Procedures/Operations  Endotracheal intubation on admission 04/23/2019-removal of foreign body from airway   Adewale A Olalere 04/29/2019, 5:08 PM

## 2019-05-04 NOTE — Progress Notes (Signed)
Patient extubated per MD's order, no stridor heard.

## 2019-05-04 DEATH — deceased

## 2019-05-07 NOTE — Telephone Encounter (Signed)
Received signed original D/C -[facility] notified for pick up. 

## 2019-06-18 ENCOUNTER — Other Ambulatory Visit: Payer: Self-pay | Admitting: Internal Medicine

## 2020-06-21 IMAGING — DX DG CHEST 1V PORT
1 series · 1 of 1 positions shown · non-contrast
Comparison: Two days ago

CLINICAL DATA: History of endotracheal tube

EXAM:
PORTABLE CHEST 1 VIEW

[chest ap]
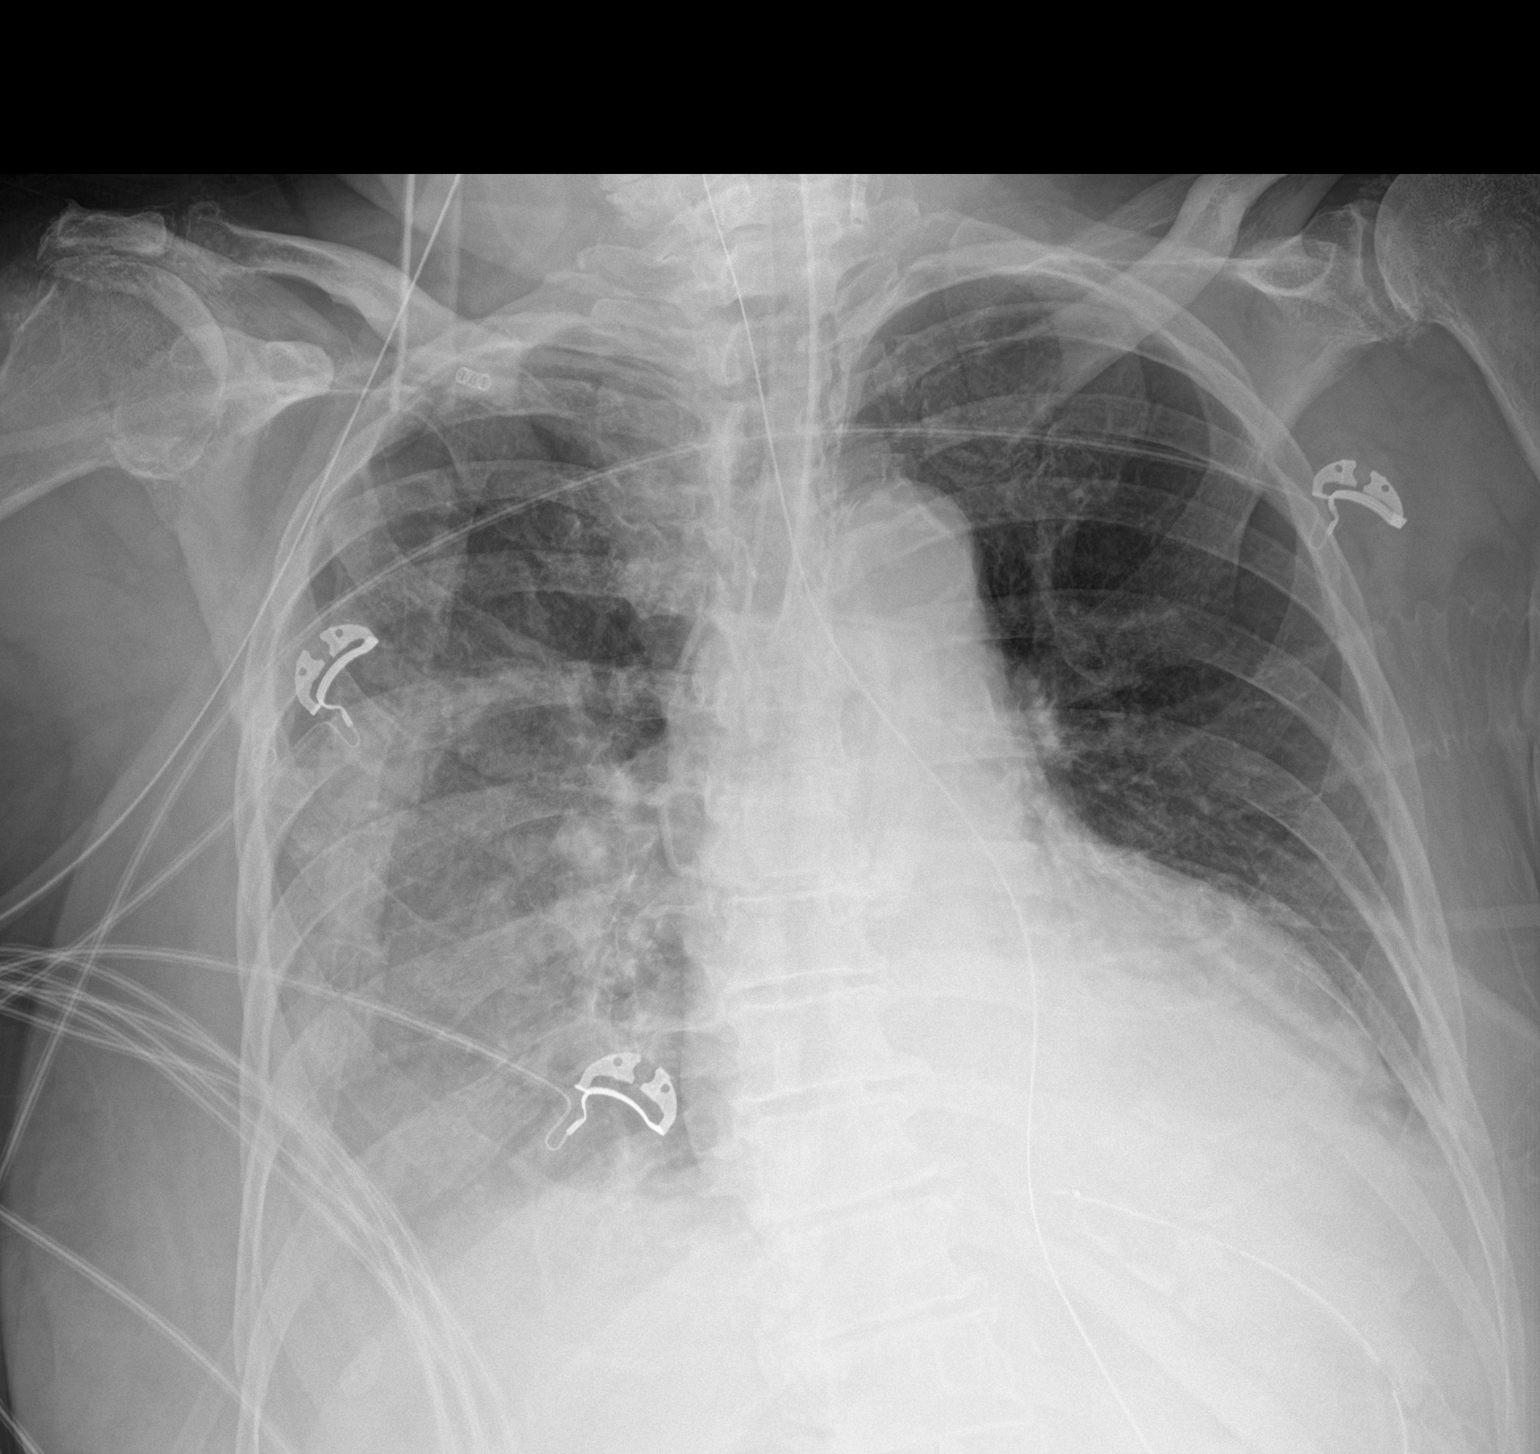

[1 of 1 positions shown; findings below may reference images not displayed]

FINDINGS: Endotracheal tube tip is halfway between the clavicular heads and
carina-the latter location is implied by mainstem bronchial anatomy
(there is overlapping ventilation hardware). The orogastric tube tip
reaches the stomach with looping into the fundus.

Bilateral airspace disease asymmetric to the right. There is mild
improvement. Stable cardiomegaly. No visible effusion or
pneumothorax.

What looks like a dental bridge overlaps the throat, 2 cm in maximal
span.

These results were called by telephone at the time of interpretation
on 04/23/2019 at [DATE] to RN [REDACTED] , who verbally acknowledged
these results.
IMPRESSION: 1. 2 cm foreign body over the neck, suspect this is a dental bridge
at the level of the throat.
2. Stable hardware positioning.
3. Improved pneumonia.

## 2020-06-22 IMAGING — DX DG CHEST 1V PORT
1 series · 1 of 1 positions shown · non-contrast
Comparison: Chest x-ray 04/23/2019

CLINICAL DATA: Adult respiratory distress syndrome.

EXAM:
PORTABLE CHEST 1 VIEW

[chest]
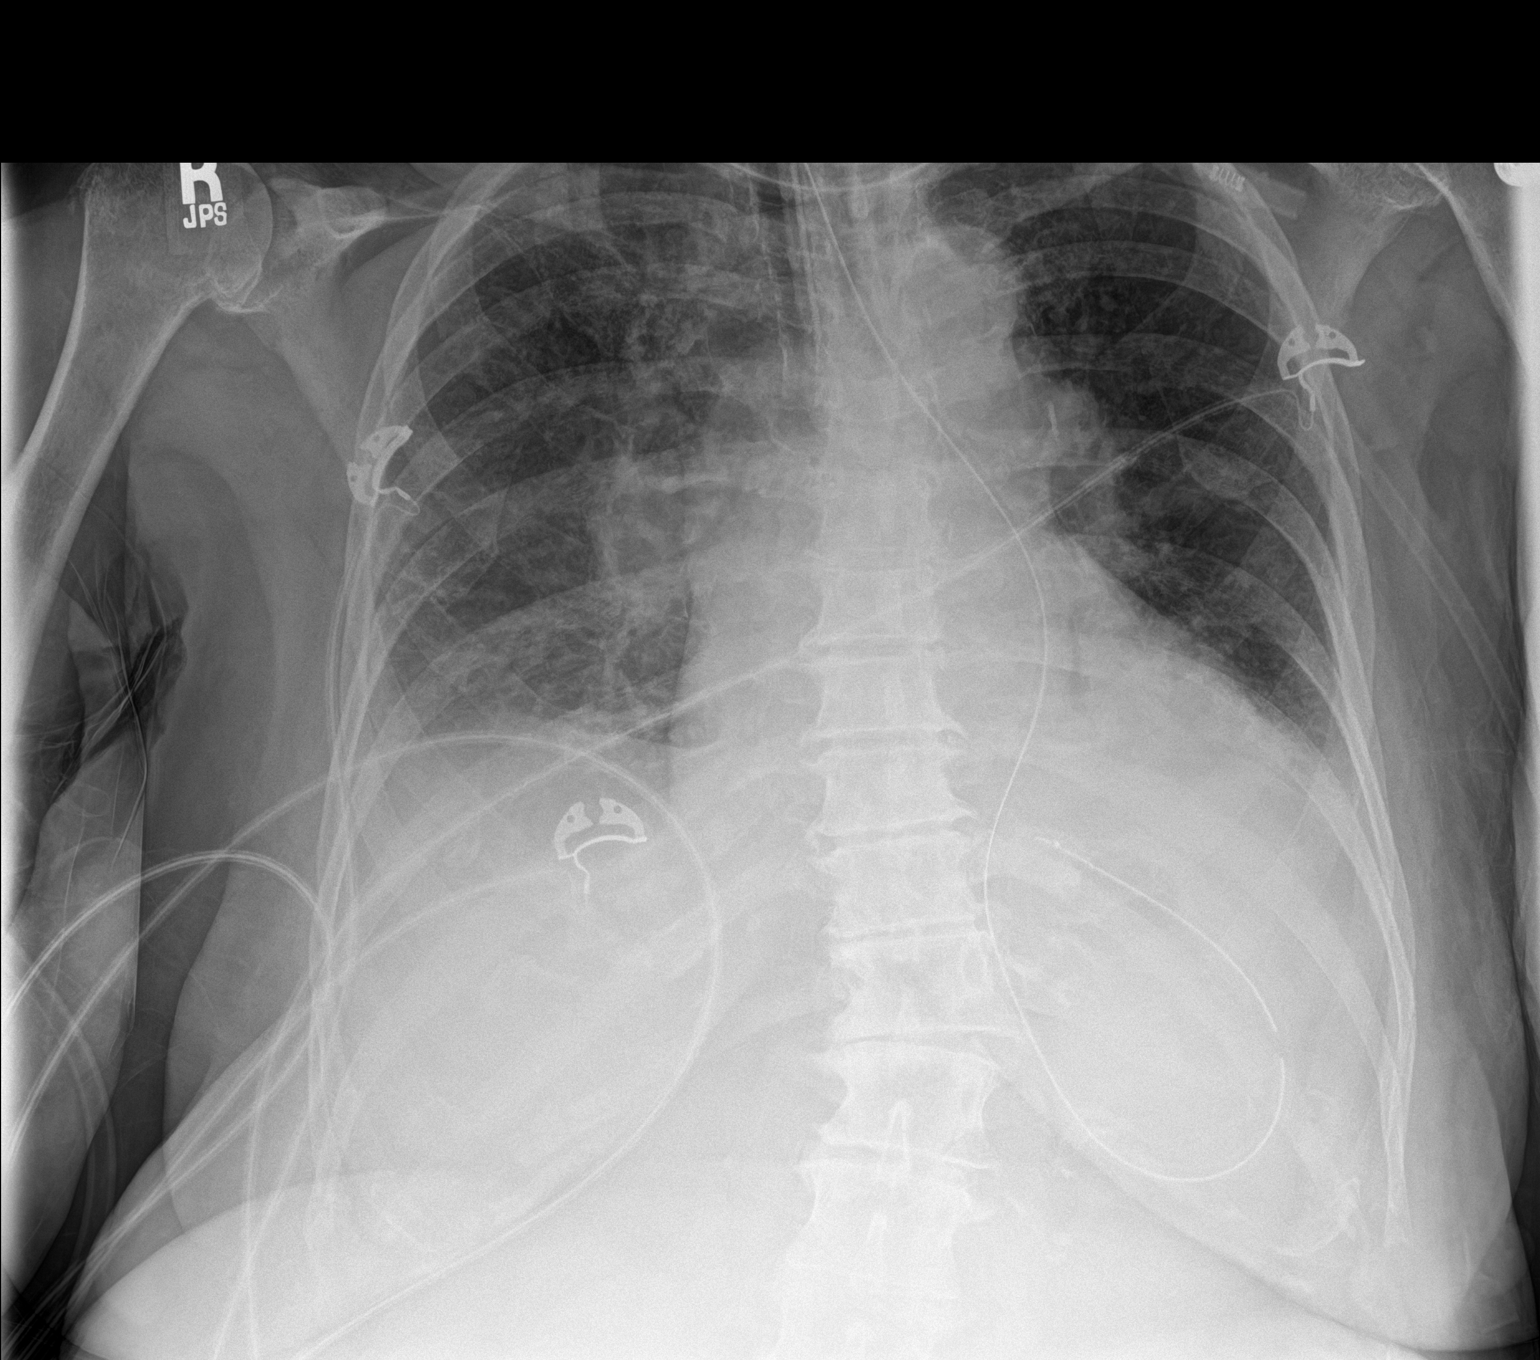

[1 of 1 positions shown; findings below may reference images not displayed]

FINDINGS: The endotracheal tube is 18.5 mm above the carina. The NG tube is
coursing down the esophagus and into the stomach.

Stable mild cardiac enlargement. Stable tortuosity and calcification
of the thoracic aorta. Prominent pulmonary hila appear relatively
stable.

Continued improved lung aeration with some persistent patchy basilar
infiltrates. Persistent small effusions also.
IMPRESSION: 1. Support apparatus in good position, unchanged.
2. Continued improved lung aeration. There are some persistent
patchy basilar infiltrates and small effusions.
